# Patient Record
Sex: Male | Born: 1990 | Race: Black or African American | Hispanic: No | Marital: Single | Smoking: Current every day smoker
Health system: Southern US, Community
[De-identification: ages and names within clinical notes are randomized; demographics above are authoritative.]

## PROBLEM LIST (undated history)

## (undated) DIAGNOSIS — F489 Nonpsychotic mental disorder, unspecified: Secondary | ICD-10-CM

---

## 2013-12-31 ENCOUNTER — Emergency Department: Payer: Self-pay | Admitting: Emergency Medicine

## 2013-12-31 LAB — COMPREHENSIVE METABOLIC PANEL
ALBUMIN: 4.4 g/dL (ref 3.4–5.0)
Alkaline Phosphatase: 77 U/L
Anion Gap: 10 (ref 7–16)
BUN: 10 mg/dL (ref 7–18)
Bilirubin,Total: 0.6 mg/dL (ref 0.2–1.0)
CALCIUM: 8.8 mg/dL (ref 8.5–10.1)
CREATININE: 1.17 mg/dL (ref 0.60–1.30)
Chloride: 100 mmol/L (ref 98–107)
Co2: 29 mmol/L (ref 21–32)
Glucose: 90 mg/dL (ref 65–99)
Osmolality: 276 (ref 275–301)
Potassium: 3.8 mmol/L (ref 3.5–5.1)
SGOT(AST): 35 U/L (ref 15–37)
SGPT (ALT): 29 U/L
SODIUM: 139 mmol/L (ref 136–145)
TOTAL PROTEIN: 8.3 g/dL — AB (ref 6.4–8.2)

## 2013-12-31 LAB — URINALYSIS, COMPLETE
BILIRUBIN, UR: NEGATIVE
BLOOD: NEGATIVE
Bacteria: NONE SEEN
GLUCOSE, UR: NEGATIVE mg/dL (ref 0–75)
Ketone: NEGATIVE
Leukocyte Esterase: NEGATIVE
Nitrite: NEGATIVE
Ph: 8 (ref 4.5–8.0)
Protein: NEGATIVE
RBC, UR: NONE SEEN /HPF (ref 0–5)
SPECIFIC GRAVITY: 1.002 (ref 1.003–1.030)
SQUAMOUS EPITHELIAL: NONE SEEN

## 2013-12-31 LAB — CBC WITH DIFFERENTIAL/PLATELET
BASOS ABS: 0.1 10*3/uL (ref 0.0–0.1)
BASOS PCT: 1.7 %
EOS ABS: 0.1 10*3/uL (ref 0.0–0.7)
EOS PCT: 1.1 %
HCT: 44.5 % (ref 40.0–52.0)
HGB: 14 g/dL (ref 13.0–18.0)
LYMPHS ABS: 2.6 10*3/uL (ref 1.0–3.6)
LYMPHS PCT: 51.5 %
MCH: 24 pg — ABNORMAL LOW (ref 26.0–34.0)
MCHC: 31.5 g/dL — ABNORMAL LOW (ref 32.0–36.0)
MCV: 76 fL — ABNORMAL LOW (ref 80–100)
MONOS PCT: 9 %
Monocyte #: 0.5 x10 3/mm (ref 0.2–1.0)
NEUTROS PCT: 36.7 %
Neutrophil #: 1.9 10*3/uL (ref 1.4–6.5)
Platelet: 268 10*3/uL (ref 150–440)
RBC: 5.83 10*6/uL (ref 4.40–5.90)
RDW: 13.5 % (ref 11.5–14.5)
WBC: 5.1 10*3/uL (ref 3.8–10.6)

## 2013-12-31 LAB — LIPASE, BLOOD: Lipase: 111 U/L (ref 73–393)

## 2017-06-24 ENCOUNTER — Emergency Department
Admission: EM | Admit: 2017-06-24 | Discharge: 2017-06-26 | Disposition: A | Discharge status: Other Acute Inpatient Hospital | Payer: No Typology Code available for payment source | Attending: Emergency Medicine | Admitting: Emergency Medicine

## 2017-06-24 ENCOUNTER — Encounter: Payer: Self-pay | Admitting: Emergency Medicine

## 2017-06-24 ENCOUNTER — Emergency Department: Payer: Self-pay

## 2017-06-24 ENCOUNTER — Other Ambulatory Visit: Payer: Self-pay

## 2017-06-24 DIAGNOSIS — R4182 Altered mental status, unspecified: Secondary | ICD-10-CM | POA: Insufficient documentation

## 2017-06-24 DIAGNOSIS — F29 Unspecified psychosis not due to a substance or known physiological condition: Secondary | ICD-10-CM

## 2017-06-24 DIAGNOSIS — F17200 Nicotine dependence, unspecified, uncomplicated: Secondary | ICD-10-CM | POA: Insufficient documentation

## 2017-06-24 DIAGNOSIS — F209 Schizophrenia, unspecified: Secondary | ICD-10-CM | POA: Insufficient documentation

## 2017-06-24 LAB — COMPREHENSIVE METABOLIC PANEL
ALBUMIN: 4.7 g/dL (ref 3.5–5.0)
ALT: 24 U/L (ref 17–63)
AST: 39 U/L (ref 15–41)
Alkaline Phosphatase: 46 U/L (ref 38–126)
Anion gap: 7 (ref 5–15)
BUN: 13 mg/dL (ref 6–20)
CHLORIDE: 101 mmol/L (ref 101–111)
CO2: 29 mmol/L (ref 22–32)
Calcium: 9.5 mg/dL (ref 8.9–10.3)
Creatinine, Ser: 0.93 mg/dL (ref 0.61–1.24)
GFR calc Af Amer: >60 mL/min (ref >60)
GFR calc non Af Amer: >60 mL/min (ref >60)
Glucose, Bld: 94 mg/dL (ref 65–99)
Potassium: 4 mmol/L (ref 3.5–5.1)
SODIUM: 137 mmol/L (ref 135–145)
Total Bilirubin: 1 mg/dL (ref 0.3–1.2)
Total Protein: 8.2 g/dL — ABNORMAL HIGH (ref 6.5–8.1)

## 2017-06-24 LAB — URINE DRUG SCREEN, QUALITATIVE (ARMC ONLY)
Amphetamines, Ur Screen: NOT DETECTED
Barbiturates, Ur Screen: NOT DETECTED
Benzodiazepine, Ur Scrn: NOT DETECTED
Cannabinoid 50 Ng, Ur ~~LOC~~: NOT DETECTED
Cocaine Metabolite,Ur ~~LOC~~: NOT DETECTED
MDMA (Ecstasy)Ur Screen: NOT DETECTED
METHADONE SCREEN, URINE: NOT DETECTED
OPIATE, UR SCREEN: NOT DETECTED
PHENCYCLIDINE (PCP) UR S: NOT DETECTED
Tricyclic, Ur Screen: NOT DETECTED

## 2017-06-24 LAB — CBC WITH DIFFERENTIAL/PLATELET
Basophils Absolute: 0 10*3/uL (ref 0–0.1)
Basophils Relative: 1 %
EOS ABS: 0.1 10*3/uL (ref 0–0.7)
EOS PCT: 3 %
HCT: 44.3 % (ref 40.0–52.0)
Hemoglobin: 14 g/dL (ref 13.0–18.0)
LYMPHS ABS: 1 10*3/uL (ref 1.0–3.6)
LYMPHS PCT: 40 %
MCH: 23.8 pg — AB (ref 26.0–34.0)
MCHC: 31.6 g/dL — AB (ref 32.0–36.0)
MCV: 75.3 fL — AB (ref 80.0–100.0)
MONOS PCT: 9 %
Monocytes Absolute: 0.2 10*3/uL (ref 0.2–1.0)
Neutro Abs: 1.2 10*3/uL — ABNORMAL LOW (ref 1.4–6.5)
Neutrophils Relative %: 47 %
PLATELETS: 270 10*3/uL (ref 150–440)
RBC: 5.88 MIL/uL (ref 4.40–5.90)
RDW: 13.5 % (ref 11.5–14.5)
WBC: 2.6 10*3/uL — ABNORMAL LOW (ref 3.8–10.6)

## 2017-06-24 LAB — ETHANOL: Alcohol, Ethyl (B): <10 mg/dL (ref <10)

## 2017-06-24 MED ORDER — RISPERIDONE 1 MG PO TABS
1.0000 mg | ORAL_TABLET | Freq: Two times a day (BID) | ORAL | Status: DC
  Administered 2017-06-24 – 2017-06-26 (×5): 1 mg via ORAL
  Filled 2017-06-24 (×5): qty 1

## 2017-06-24 NOTE — ED Notes (Signed)
Pt given tray

## 2017-06-24 NOTE — ED Triage Notes (Signed)
Patient states he has had stress, denies thoughts of harming self, conversation noted delusional.

## 2017-06-24 NOTE — ED Provider Notes (Signed)
[facility] Emergency Department Provider Note   ____________________________________________   First MD Initiated Contact with Patient 06/24/17 1135     (approximate)  I have reviewed the triage vital signs and the nursing notes.   HISTORY  Chief Complaint Anxiety    HPI Nathan Barnett. is a 27 y.o. male says he is going to come into some money here and wants to take precautionary measures because he has problems with anxiety.  Denies any thoughts of hurting himself or anyone else.  Triage nurse notes conversation delusional.  The patient really does not make a lot of sense about why he came in today.  But he seems otherwise okay.   History reviewed. No pertinent past medical history.  There are no active problems to display for this patient.   History reviewed. No pertinent surgical history.  Prior to Admission medications   Not on File    Allergies Patient has no known allergies.  No family history on file.  Social History Social History   Tobacco Use  . Smoking status: Current Some Day Smoker    Types: Cigarettes  Substance Use Topics  . Alcohol use: Not on file  . Drug use: Not on file    Review of Systems  Constitutional: No fever/chills Eyes: No visual changes. ENT: No sore throat. Cardiovascular: Denies chest pain. Respiratory: Denies shortness of breath. Gastrointestinal: No abdominal pain.  No nausea, no vomiting.  No diarrhea.  No constipation. Genitourinary: Negative for dysuria. Musculoskeletal: Negative for back pain. Skin: Negative for rash. Neurological: Negative for headaches, focal weakness  ____________________________________________   PHYSICAL EXAM:  VITAL SIGNS: ED Triage Vitals  Enc Vitals Group     BP 06/24/17 1100 (!) 149/95     Pulse Rate 06/24/17 1100 69     Resp 06/24/17 1100 20     Temp 06/24/17 1100 98.1 F (36.7 C)     Temp Source 06/24/17 1100 Oral     SpO2 06/24/17 1100 100  %     Weight 06/24/17 1101 150 lb (68 kg)     Height 06/24/17 1101  (1.854 m)     Head Circumference --      Peak Flow --      Pain Score 06/24/17 1101 0     Pain Loc --      Pain Edu? --      Excl. in GC? --     Constitutional: Alert and oriented. Well appearing and in no acute distress. Eyes: Conjunctivae are normal.  Head: Atraumatic. Nose: No congestion/rhinnorhea. Mouth/Throat: Mucous membranes are moist.  Oropharynx non-erythematous. Neck: No stridor.   Cardiovascular: Normal rate, regular rhythm. Grossly normal heart sounds.  Good peripheral circulation. Respiratory: Normal respiratory effort.  No retractions. Lungs CTAB. Gastrointestinal: Soft and nontender. No distention. No abdominal bruits. No CVA tenderness. Musculoskeletal: No lower extremity tenderness nor edema.   Neurologic:  Normal speech and language. No gross focal neurologic deficits are appreciated. No gait instability. Skin:  Skin is warm, dry and intact. No rash noted. Psychiatric: Mood and affect are normal. Speech and behavior are normal except as noted in HPI.  ____________________________________________   LABS (all labs ordered are listed, but only abnormal results are displayed)  Labs Reviewed  COMPREHENSIVE METABOLIC PANEL - Abnormal; Notable for the following components:      Result Value   Total Protein 8.2 (*)    All other components within normal limits  CBC WITH DIFFERENTIAL/PLATELET - Abnormal;  Notable for the following components:   WBC 2.6 (*)    MCV 75.3 (*)    MCH 23.8 (*)    MCHC 31.6 (*)    Neutro Abs 1.2 (*)    All other components within normal limits  ETHANOL  URINE DRUG SCREEN, QUALITATIVE ([facility] ONLY)   ____________________________________________  EKG  ________________________________________  RADIOLOGY  ED MD interpretation:   Official radiology report(s): No results found.  ____________________________________________   PROCEDURES  Procedure(s)  performed:   Procedures  Critical Care performed:   ____________________________________________   INITIAL IMPRESSION / ASSESSMENT AND PLAN / ED COURSE   Tele-psych sees patient feels he has psychosis I agree with this.      ____________________________________________   FINAL CLINICAL IMPRESSION(S) / ED DIAGNOSES  Final diagnoses:  Psychosis, unspecified psychosis type [facility])     ED Discharge Orders    None       Note:  This document was prepared using Dragon voice recognition software and may include unintentional dictation errors.    Arnaldo Natal, MD 06/24/17 [phone]

## 2017-06-24 NOTE — BH Assessment (Signed)
Writer followed up with patient's mother [name] [phone]) and updated her on patient's disposition.

## 2017-06-24 NOTE — ED Notes (Signed)
Pt. Advised of security cameras and 15 minute safety checks.  Pt. Calm and cooperative.  Pt. Asking when he would be admitted.  Pt. Told it will probable not be tonight.  Pt. Advised to come to this nurse with any concerns or questions.

## 2017-06-24 NOTE — ED Notes (Signed)
Patient to move from RM: 22 to B-4.

## 2017-06-24 NOTE — ED Notes (Signed)
IVC/SOC completed/ pending placement

## 2017-06-24 NOTE — ED Notes (Signed)
Pt dressed out. Pt belongings bag: jacket, shirt,tank top, shorts, briefs, loafers.

## 2017-06-24 NOTE — BH Assessment (Signed)
Assessment Note  Nathan Barnett. is an 27 y.o. male who presents to the ER via his mother Nathan Barnett [phone]). Per the report of the patient, he came to the ER "to get checked out, because I'm coming into a lot of money." Per the report of the patient's mother, his mental state and behaviors have changed within the last several years. Family initially thought the change was due to his alcohol use. Approximately a year ago, his family sought to get inpatient for alcohol treatment, but the initially facility educated them about the primary symptoms they were witnessing wasn't from the alcohol use but from mental illness. Thus, they referred him to another facility to address it, [facility] When he was inpatient, he was started on medications but when he was discharged, he stop taking it because he said it made him feel sluggish and tired.  Mother further reports, the patient has been paranoid, he believes people are trying to hurt him. He has start going to neighbors houses, auguring with them because he feel they were talking about him. When he is talking, his thoughts are disorganized and difficult to follow. On today (06/24/2017), while he was in the car with his mother, he asked her "You think I'm going to win?" Patient's mother stated she didn't know what he was talking about, so she told him "yes he was a going to win." She used this as an opportunity to get him to come to the ER. She told him, "You need to come to the hospital to get checked out, so if you get the money you can be okay to manage it." Thus, patient agreed to come.  Mother also reports, the patient isolate his self and hygiene had decrease. Prior to his current presentation, the patient was well groomed and "took pride" in how he looked. He was able to complete school and receive his Bachelors Degree from [employer]  During the interview, the patient was guarded and provided limited answers to the question. Prior to  writer walking into his room, he witnessed the patient responding to internal stimuli. At times when he was answering writer's questions, he would look to the side; nod his head and then answered.   Diagnosis: Schizophrenia  Past Medical History: History reviewed. No pertinent past medical history.  History reviewed. No pertinent surgical history.  Family History: No family history on file.  Social History:  reports that he has been smoking cigarettes.  He does not have any smokeless tobacco history on file. His alcohol and drug histories are not on file.  Additional Social History:  Alcohol / Drug Use Pain Medications: See PTA Prescriptions: See PTA Over the Counter: See PTA History of alcohol / drug use?: Yes Longest period of sobriety (when/how long): Unable to quantify Negative Consequences of Use: (n/a) Withdrawal Symptoms: (n/a) Substance #1 Name of Substance 1: Alcohol 1 - Age of First Use: 24 1 - Amount (size/oz): 2-40oz 1 - Frequency: 3 to 4 times week 1 - Duration: Approximately 2 to 3 years 1 - Last Use / Amount: 06/23/2017  CIWA: CIWA-Ar BP: (!) 149/95 Pulse Rate: 69 COWS:    Allergies: No Known Allergies  Home Medications:  (Not in a hospital admission)  OB/GYN Status:  No LMP for male patient.  General Assessment Data Location of Assessment:  [facility] ED TTS Assessment: In system Is this a Tele or Face-to-Face Assessment?: Face-to-Face Is this an Initial Assessment or a Re-assessment for this encounter?: Initial Assessment Marital status:  Single Maiden name: n/a Is patient pregnant?: No Living Arrangements: Parent Can pt return to current living arrangement?: Yes Admission Status: Involuntary Is patient capable of signing voluntary admission?: No(Under IVC) Referral Source: Self/Family/Friend Insurance type: None  Medical Screening Exam [facility] Walk-in ONLY) Medical Exam completed: Yes  Crisis Care Plan Living Arrangements: Parent Legal Guardian:  Other:(Self) Name of Psychiatrist: Reports of none Name of Therapist: Reports of none  Education Status Is patient currently in school?: No Is the patient employed, unemployed or receiving disability?: Unemployed  Risk to self with the past 6 months Suicidal Ideation: No Has patient been a risk to self within the past 6 months prior to admission? : No Suicidal Intent: No Has patient had any suicidal intent within the past 6 months prior to admission? : No Is patient at risk for suicide?: No Suicidal Plan?: No Has patient had any suicidal plan within the past 6 months prior to admission? : No Access to Means: No What has been your use of drugs/alcohol within the last 12 months?: Alcohol Previous Attempts/Gestures: No How many times?: 0 Other Self Harm Risks: Reports of none Triggers for Past Attempts: None known Intentional Self Injurious Behavior: None Family Suicide History: No Recent stressful life event(s): Other (Comment)(Recent onsite of Psychosis) Persecutory voices/beliefs?: No Depression: Yes Depression Symptoms: Insomnia, Isolating, Feeling angry/irritable Substance abuse history and/or treatment for substance abuse?: No Suicide prevention information given to non-admitted patients: Not applicable  Risk to Others within the past 6 months Homicidal Ideation: No Does patient have any lifetime risk of violence toward others beyond the six months prior to admission? : No Thoughts of Harm to Others: No Current Homicidal Intent: No Current Homicidal Plan: No Access to Homicidal Means: No Identified Victim: Reports of none History of harm to others?: No Assessment of Violence: None Noted Violent Behavior Description: Reports of none Does patient have access to weapons?: No Criminal Charges Pending?: No Is patient on probation?: No  Psychosis Hallucinations: Auditory, Visual Delusions: None noted  Mental Status Report Appearance/Hygiene: Unremarkable, In  scrubs Eye Contact: Fair Motor Activity: Freedom of movement, Unremarkable Speech: Logical/coherent, Unremarkable Level of Consciousness: Alert Mood: Depressed, Anxious, Pleasant Affect: Appropriate to circumstance, Depressed, Anxious Anxiety Level: Minimal Thought Processes: Coherent, Relevant Judgement: Unimpaired Orientation: Person, Place, Time, Situation, Appropriate for developmental age Obsessive Compulsive Thoughts/Behaviors: Minimal  Cognitive Functioning Concentration: Normal Memory: Recent Intact, Remote Intact Is patient IDD: No Is patient DD?: No Insight: Fair Impulse Control: Fair Appetite: Fair Have you had any weight changes? : No Change Sleep: No Change Total Hours of Sleep: 8 Vegetative Symptoms: None  ADLScreening [facility] Assessment Services) Patient's cognitive ability adequate to safely complete daily activities?: Yes Patient able to express need for assistance with ADLs?: Yes Independently performs ADLs?: Yes (appropriate for developmental age)  Prior Inpatient Therapy Prior Inpatient Therapy: Yes Prior Therapy Dates: 2018 Prior Therapy Facilty/Provider(s): [facility] Reason for Treatment: Psychosis & Alcohol Treatment  Prior Outpatient Therapy Prior Outpatient Therapy: No Does patient have an ACCT team?: No Does patient have Intensive In-House Services?  : No Does patient have [facility] services? : No Does patient have P4CC services?: No  ADL Screening (condition at time of admission) Patient's cognitive ability adequate to safely complete daily activities?: Yes Is the patient deaf or have difficulty hearing?: No Does the patient have difficulty seeing, even when wearing glasses/contacts?: No Does the patient have difficulty concentrating, remembering, or making decisions?: No Patient able to express need for assistance  with ADLs?: Yes Does the patient have difficulty dressing or bathing?: No Independently  performs ADLs?: Yes (appropriate for developmental age) Does the patient have difficulty walking or climbing stairs?: No Weakness of Legs: None Weakness of Arms/Hands: None  Home Assistive Devices/Equipment Home Assistive Devices/Equipment: None  Therapy Consults (therapy consults require a physician order) PT Evaluation Needed: No OT Evalulation Needed: No SLP Evaluation Needed: No Abuse/Neglect Assessment (Assessment to be complete while patient is alone) Abuse/Neglect Assessment Can Be Completed: Yes Physical Abuse: Denies Verbal Abuse: Denies Sexual Abuse: Denies Exploitation of patient/patient's resources: Denies Self-Neglect: Denies Values / Beliefs Cultural Requests During Hospitalization: None Spiritual Requests During Hospitalization: None Consults Spiritual Care Consult Needed: No Social Work Consult Needed: No         Child/Adolescent Assessment Running Away Risk: Denies(Patient is an adult)  Disposition:  Disposition Initial Assessment Completed for this Encounter: Yes  On Site Evaluation by:   Reviewed with Physician:    Lilyan Gilford MS, LCAS, LPC, NCC, CCSI Therapeutic Triage Specialist 06/24/2017 4:39 PM

## 2017-06-25 NOTE — ED Notes (Signed)
Patient received PM snack. 

## 2017-06-25 NOTE — ED Notes (Signed)

## 2017-06-25 NOTE — ED Notes (Signed)
Patient alert and verbal. Patient is somewhat guarded. He interacts with this writer very minimal. He states he knows why he is in the hospital but did not care to talk about it. Patient is taking medications as ordered.Patient denies SI/HI and A/V hallucinations. Q 15 minute checks in progress and patient remains safe on unit. Monitoring continues.

## 2017-06-25 NOTE — ED Notes (Signed)
Pt taking a shower at this time 

## 2017-06-25 NOTE — ED Notes (Signed)
Patient given dinner meal tray

## 2017-06-25 NOTE — ED Notes (Signed)
Breakfast tray placed in room.  Pt sleeping.  

## 2017-06-25 NOTE — ED Notes (Signed)
IVC/Pending placement 

## 2017-06-26 ENCOUNTER — Inpatient Hospital Stay
Admission: AD | Admit: 2017-06-26 | Discharge: 2017-07-04 | DRG: 885 | Disposition: A | Discharge status: Home / Self Care | Payer: No Typology Code available for payment source | Attending: Psychiatry | Admitting: Psychiatry

## 2017-06-26 ENCOUNTER — Encounter: Payer: Self-pay | Admitting: Behavioral Health

## 2017-06-26 ENCOUNTER — Other Ambulatory Visit: Payer: Self-pay

## 2017-06-26 DIAGNOSIS — D708 Other neutropenia: Secondary | ICD-10-CM

## 2017-06-26 DIAGNOSIS — D709 Neutropenia, unspecified: Secondary | ICD-10-CM | POA: Diagnosis present

## 2017-06-26 DIAGNOSIS — R161 Splenomegaly, not elsewhere classified: Secondary | ICD-10-CM

## 2017-06-26 DIAGNOSIS — F17210 Nicotine dependence, cigarettes, uncomplicated: Secondary | ICD-10-CM | POA: Diagnosis present

## 2017-06-26 DIAGNOSIS — F209 Schizophrenia, unspecified: Secondary | ICD-10-CM | POA: Diagnosis present

## 2017-06-26 DIAGNOSIS — F29 Unspecified psychosis not due to a substance or known physiological condition: Secondary | ICD-10-CM | POA: Diagnosis not present

## 2017-06-26 DIAGNOSIS — F203 Undifferentiated schizophrenia: Secondary | ICD-10-CM | POA: Diagnosis not present

## 2017-06-26 DIAGNOSIS — Z720 Tobacco use: Secondary | ICD-10-CM | POA: Diagnosis not present

## 2017-06-26 MED ORDER — HYDROXYZINE HCL 25 MG PO TABS
25.0000 mg | ORAL_TABLET | Freq: Three times a day (TID) | ORAL | Status: DC | PRN
  Administered 2017-06-28: 25 mg via ORAL
  Filled 2017-06-26: qty 1

## 2017-06-26 MED ORDER — PALIPERIDONE ER 3 MG PO TB24
3.0000 mg | ORAL_TABLET | Freq: Every day | ORAL | Status: DC
  Administered 2017-06-26: 3 mg via ORAL
  Filled 2017-06-26: qty 1

## 2017-06-26 MED ORDER — TRAZODONE HCL 100 MG PO TABS
100.0000 mg | ORAL_TABLET | Freq: Every evening | ORAL | Status: DC | PRN
  Administered 2017-06-26: 100 mg via ORAL
  Filled 2017-06-26: qty 1

## 2017-06-26 MED ORDER — ACETAMINOPHEN 325 MG PO TABS: 650.0000 mg | ORAL_TABLET | Freq: Four times a day (QID) | ORAL | Status: DC | PRN

## 2017-06-26 MED ORDER — MAGNESIUM HYDROXIDE 400 MG/5ML PO SUSP: 30.0000 mL | Freq: Every day | ORAL | Status: DC | PRN

## 2017-06-26 MED ORDER — PALIPERIDONE ER 3 MG PO TB24: 3.0000 mg | ORAL_TABLET | Freq: Every day | ORAL | Status: DC

## 2017-06-26 MED ORDER — ALUM & MAG HYDROXIDE-SIMETH 200-200-20 MG/5ML PO SUSP: 30.0000 mL | ORAL | Status: DC | PRN

## 2017-06-26 NOTE — BH Assessment (Signed)
Patient is to be admitted to [facility] by Dr. Toni Amend.  Attending Physician will be Dr. Johnella Moloney.   Patient has been assigned to room 315, by [facility] Charge Nurse Shatara.   Intake Paper Work has been signed and placed on patient chart.  ER staff is aware of the admission:  Misty Stanley, ER Kathrynn Speed   Dr. Lamont Snowball, ER MD   Fredia Beets., Patient's Nurse   Mertie Clause, Patient Access.

## 2017-06-26 NOTE — Consult Note (Signed)
Addendum to previous note.  After dictating I was able to reach the patient's mother on the telephone.  She described a history over the last few years that would be very consistent with gradually developing schizophrenia.  This is also a diagnosis they had been presented with previously but he has not been following up with any outpatient treatment lately.  She told me that the precipitating situation to bring him to the hospital today is that he has started to have odd behavior of shouting out and insulting neighbors in ways that make no sense and are disruptive.  No change to treatment plan

## 2017-06-26 NOTE — ED Notes (Signed)
Patient ate 100% of lunch and beverage. Patient is cooperative, does not want to talk much, likes being left alone, gives short answers and avoids conversation.

## 2017-06-26 NOTE — ED Provider Notes (Signed)
-----------------------------------------   12:39 AM on 06/26/2017 -----------------------------------------   Blood pressure 118/74, pulse 71, temperature 98.3 F (36.8 C), temperature source Oral, resp. rate 18, height  (1.854 m), weight 68 kg (150 lb), SpO2 100 %.  The patient had no acute events since last update.  Calm and cooperative at this time.  Disposition is pending Psychiatry/Behavioral Medicine team recommendations.     Myrna Blazer, MD 06/26/17 [phone]

## 2017-06-26 NOTE — ED Notes (Signed)
Patient with discharge orders/readmit, transferring to room 315, nurse reported to [employer],  Patient going in police custody that He is under IVC, and transfers via w/c.

## 2017-06-26 NOTE — Tx Team (Signed)
Initial Treatment Plan 06/26/2017 6:48 PM Nathan Barnett [name] [id number]    PATIENT STRESSORS: Marital or family conflict Medication change or noncompliance   PATIENT STRENGTHS: Active sense of humor Supportive family/friends   PATIENT IDENTIFIED PROBLEMS: Medication compliance  Self care with ADLs  Develop a goals for discharge                 DISCHARGE CRITERIA:  Adequate post-discharge living arrangements Motivation to continue treatment in a less acute level of care Verbal commitment to aftercare and medication compliance  PRELIMINARY DISCHARGE PLAN: Attend PHP/IOP Outpatient therapy Return to previous living arrangement  PATIENT/FAMILY INVOLVEMENT: This treatment plan has been presented to and reviewed with the patient, Nathan Barnett., and/or family member.  The patient and family have been given the opportunity to ask questions and make suggestions.  Nathan Arnt, RN 06/26/2017, 6:48 PM

## 2017-06-26 NOTE — ED Notes (Signed)
Dr. Clapacs talking with Patient, Patient is calm and cooperative.  

## 2017-06-26 NOTE — Consult Note (Signed)
[facility] Face-to-Face Psychiatry Consult   Reason for Consult: Consult for 27 year old man who is brought to the emergency room over the weekend in the company of his family with concern about his behavior and his thinking. Referring Physician: Don Perking Patient Identification: Nathan Barnett. MRN:  [id number] Principal Diagnosis: Schizophrenia [facility]) Diagnosis:   Patient Active Problem List   Diagnosis Date Noted  . Schizophrenia (HCC) [F20.9] 06/26/2017    Total Time spent with patient: 1 hour  Subjective:   Nathan Barnett. is a 27 y.o. male patient admitted with "I just needed some kind of rest".  HPI: Patient interviewed.  Chart reviewed.  27 year old man was brought here by his mother over the weekend with stated concerns that his behavior has been getting stranger.  The patient himself was interviewed today and was not able to articulate a very clear complaint.  He tells me that he is about to inherit some money but gets paranoid and refuses to explain that.  He says that he and his mother felt that he needed to come to the hospital for a "prophylactic" rest in order to get his "mind together".  When challenged to be more specific about what that means and what kind of symptoms he is having he is very evasive and denies any actual symptoms.  Denies depression.  Denies hallucinations.  Denies suicidal or homicidal thoughts and even denies anxiety.  According to the chart the mother reports that the patient's behavior got strange several years ago and has gotten stranger and stayed that way.  He is not functioning at his potential.  Lives at home not working acting weird talking strangely not getting any psychiatric treatment.  No evidence of any current use of alcohol or drugs.    Substance abuse history: Looking through the chart it looks like this was considered as an etiology years ago but the patient denies that he is drinking or using any drugs currently there is nothing in his  drug or alcohol screen.  Medical history: No known significant medical problems  Social history: Lives with his family of origin.  Graduated from college.  Not working not really functioning now.  Past Psychiatric History: Patient evidently had inpatient treatment of some sort a couple years ago.  According to the chart and the mother says that he was diagnosed with a mental health problem.  Medications unclear.  Patient himself denies ever having been prescribed any medicines denies knowing of any diagnosis.  No history report of any violence or suicidal behavior  Risk to Self: Suicidal Ideation: No Suicidal Intent: No Is patient at risk for suicide?: No Suicidal Plan?: No Access to Means: No What has been your use of drugs/alcohol within the last 12 months?: Alcohol How many times?: 0 Other Self Harm Risks: Reports of none Triggers for Past Attempts: None known Intentional Self Injurious Behavior: None Risk to Others: Homicidal Ideation: No Thoughts of Harm to Others: No Current Homicidal Intent: No Current Homicidal Plan: No Access to Homicidal Means: No Identified Victim: Reports of none History of harm to others?: No Assessment of Violence: None Noted Violent Behavior Description: Reports of none Does patient have access to weapons?: No Criminal Charges Pending?: No Prior Inpatient Therapy: Prior Inpatient Therapy: Yes Prior Therapy Dates: 2018 Prior Therapy Facilty/Provider(s): [facility] Reason for Treatment: Psychosis & Alcohol Treatment Prior Outpatient Therapy: Prior Outpatient Therapy: No Does patient have an ACCT team?: No Does patient have Intensive In-House Services?  :  No Does patient have [facility] services? : No Does patient have P4CC services?: No  Past Medical History: History reviewed. No pertinent past medical history. History reviewed. No pertinent surgical history. Family History: No family history on file. Family  Psychiatric  History: None identified Social History:  Social History   Substance and Sexual Activity  Alcohol Use Not on file     Social History   Substance and Sexual Activity  Drug Use Not on file    Social History   Socioeconomic History  . Marital status: Single    Spouse name: Not on file  . Number of children: Not on file  . Years of education: Not on file  . Highest education level: Not on file  Occupational History  . Not on file  Social Needs  . Financial resource strain: Not on file  . Food insecurity:    Worry: Not on file    Inability: Not on file  . Transportation needs:    Medical: Not on file    Non-medical: Not on file  Tobacco Use  . Smoking status: Current Some Day Smoker    Types: Cigarettes  Substance and Sexual Activity  . Alcohol use: Not on file  . Drug use: Not on file  . Sexual activity: Not on file  Lifestyle  . Physical activity:    Days per week: Not on file    Minutes per session: Not on file  . Stress: Not on file  Relationships  . Social connections:    Talks on phone: Not on file    Gets together: Not on file    Attends religious service: Not on file    Active member of club or organization: Not on file    Attends meetings of clubs or organizations: Not on file    Relationship status: Not on file  Other Topics Concern  . Not on file  Social History Narrative  . Not on file   Additional Social History:    Allergies:  No Known Allergies  Labs: No results found for this or any previous visit (from the past 48 hour(s)).  Current Facility-Administered Medications  Medication Dose Route Frequency Provider Last Rate Last Dose  . risperiDONE (RISPERDAL) tablet 1 mg  1 mg Oral BID Arnaldo Natal, MD   1 mg at 06/26/17 1036   No current outpatient medications on file.    Musculoskeletal: Strength & Muscle Tone: within normal limits Gait & Station: normal Patient leans: N/A  Psychiatric Specialty Exam: Physical Exam   Nursing note and vitals reviewed. Constitutional: He appears well-developed and well-nourished.  HENT:  Head: Normocephalic and atraumatic.  Eyes: Pupils are equal, round, and reactive to light. Conjunctivae are normal.  Neck: Normal range of motion.  Cardiovascular: Regular rhythm and normal heart sounds.  Respiratory: Effort normal. No respiratory distress.  GI: Soft.  Musculoskeletal: Normal range of motion.  Neurological: He is alert.  Skin: Skin is warm and dry.  Psychiatric: His affect is blunt. His speech is delayed and tangential. He is slowed. He is not aggressive and not hyperactive. Thought content is paranoid. Cognition and memory are impaired. He expresses impulsivity and inappropriate judgment. He expresses no homicidal and no suicidal ideation.    Review of Systems  Constitutional: Negative.   HENT: Negative.   Eyes: Negative.   Respiratory: Negative.   Cardiovascular: Negative.   Gastrointestinal: Negative.   Musculoskeletal: Negative.   Skin: Negative.   Neurological: Negative.   Psychiatric/Behavioral: Negative for depression,  hallucinations, memory loss, substance abuse and suicidal ideas. The patient is nervous/anxious. The patient does not have insomnia.     Blood pressure 118/74, pulse 71, temperature 98.3 F (36.8 C), temperature source Oral, resp. rate 18, height  (1.854 m), weight 68 kg (150 lb), SpO2 100 %.Body mass index is 19.79 kg/m.  General Appearance: Casual  Eye Contact:  Fair  Speech:  Clear and Coherent  Volume:  Normal  Mood:  Euthymic  Affect:  Constricted  Thought Process:  Disorganized  Orientation:  Full (Time, Place, and Person)  Thought Content:  Illogical and Rumination  Suicidal Thoughts:  No  Homicidal Thoughts:  No  Memory:  Immediate;   Fair Recent;   Fair Remote;   Fair  Judgement:  Impaired  Insight:  Lacking  Psychomotor Activity:  Decreased  Concentration:  Concentration: Fair  Recall:  [employer] of Knowledge:   Fair  Language:  Fair  Akathisia:  No  Handed:  Right  AIMS (if indicated):     Assets:  Desire for Improvement Housing Physical Health  ADL's:  Intact  Cognition:  Impaired,  Mild  Sleep:        Treatment Plan Summary: Medication management and Plan This is a 27 year old man who his history is 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  Not on any medication.  Insight poor.  Because his behavior has so far not been extreme he appears to have evaded any forced treatment.  In the interview with me he holds it together pretty well.  Some of the things in the history are a little more worrisome such as the fact that he has been going to other people's houses knocking on doors of strangers in the neighborhood.  Patient could benefit from psychiatric hospitalization and commitment has already been filed.  Recommend admission to the psychiatric ward case reviewed with TTS.  Orders completed.  Disposition: Recommend psychiatric Inpatient admission when medically cleared. Supportive therapy provided about ongoing stressors.  Mordecai Rasmussen, MD 06/26/2017 3:11 PM

## 2017-06-26 NOTE — Plan of Care (Signed)
Patient is a 27 year old admitted due to psychosis; bizarre behavior. During admission onto the unit patient denied SI, HI and AVH. During admission patient could not sit still, very fidgety with bizarre behavior such as rubbing deodorant onto stomach and looking inside of his pants. When asked what brought patient to the hospital, patients response was "I'd rather be safe then sorry." Nurse asked patient what was meant by "Rather be safe than sorry" patient would not answer only laughed and continued to giggle.  Patient states he lives with, " Two brothers, my mother and her husband." Patient states he drinks 2 beers a month and smokes cigarettes, two packs a month. RN asked if patient would like a nicotine patch, patient declined, RN explained if at any time patient would like to receive smoking cessation information please contact staff. Patient does not have a guardian and refused information  about advanced directives. Patient very paranoid and gaurded during admission. Patient skin assessment completed with the help of Demetria RN. Skin intact, and dry with no contraband found. RN will continue to monitor. Problem: Education: Goal: Knowledge of [facility] Education information/materials will improve Outcome: Not Progressing Goal: Emotional status will improve Outcome: Not Progressing Goal: Mental status will improve Outcome: Not Progressing Goal: Verbalization of understanding the information provided will improve Outcome: Not Progressing   Problem: Activity: Goal: Interest or engagement in activities will improve Outcome: Not Progressing

## 2017-06-27 ENCOUNTER — Inpatient Hospital Stay: Payer: No Typology Code available for payment source

## 2017-06-27 DIAGNOSIS — F203 Undifferentiated schizophrenia: Secondary | ICD-10-CM

## 2017-06-27 DIAGNOSIS — D708 Other neutropenia: Secondary | ICD-10-CM

## 2017-06-27 DIAGNOSIS — F209 Schizophrenia, unspecified: Principal | ICD-10-CM

## 2017-06-27 DIAGNOSIS — F29 Unspecified psychosis not due to a substance or known physiological condition: Secondary | ICD-10-CM

## 2017-06-27 DIAGNOSIS — Z720 Tobacco use: Secondary | ICD-10-CM

## 2017-06-27 LAB — LIPID PANEL
CHOLESTEROL: 157 mg/dL (ref 0–200)
HDL: 62 mg/dL (ref >40)
LDL CALC: 77 mg/dL (ref 0–99)
Total CHOL/HDL Ratio: 2.5 RATIO
Triglycerides: 89 mg/dL (ref <150)
VLDL: 18 mg/dL (ref 0–40)

## 2017-06-27 LAB — CBC WITH DIFFERENTIAL/PLATELET
BASOS ABS: 0 10*3/uL (ref 0–0.1)
BASOS PCT: 1 %
Basophils Absolute: 0 10*3/uL (ref 0–0.1)
Basophils Relative: 1 %
EOS ABS: 0.1 10*3/uL (ref 0–0.7)
Eosinophils Absolute: 0.1 10*3/uL (ref 0–0.7)
Eosinophils Relative: 3 %
Eosinophils Relative: 3 %
HCT: 39.7 % — ABNORMAL LOW (ref 40.0–52.0)
HCT: 38.8 % — ABNORMAL LOW (ref 40.0–52.0)
HEMOGLOBIN: 12.7 g/dL — AB (ref 13.0–18.0)
Hemoglobin: 12.5 g/dL — ABNORMAL LOW (ref 13.0–18.0)
LYMPHS ABS: 1.2 10*3/uL (ref 1.0–3.6)
LYMPHS PCT: 46 %
Lymphocytes Relative: 52 %
Lymphs Abs: 1.6 10*3/uL (ref 1.0–3.6)
MCH: 24.4 pg — ABNORMAL LOW (ref 26.0–34.0)
MCH: 24.2 pg — ABNORMAL LOW (ref 26.0–34.0)
MCHC: 32.1 g/dL (ref 32.0–36.0)
MCHC: 32.2 g/dL (ref 32.0–36.0)
MCV: 76.1 fL — ABNORMAL LOW (ref 80.0–100.0)
MCV: 75.3 fL — AB (ref 80.0–100.0)
MONO ABS: 0.2 10*3/uL (ref 0.2–1.0)
Monocytes Absolute: 0.2 10*3/uL (ref 0.2–1.0)
Monocytes Relative: 8 %
Monocytes Relative: 6 %
NEUTROS ABS: 1.5 10*3/uL (ref 1.4–6.5)
NEUTROS PCT: 36 %
Neutro Abs: 0.8 10*3/uL — ABNORMAL LOW (ref 1.4–6.5)
Neutrophils Relative %: 44 %
Platelets: 218 10*3/uL (ref 150–440)
Platelets: 219 10*3/uL (ref 150–440)
RBC: 5.22 MIL/uL (ref 4.40–5.90)
RBC: 5.15 MIL/uL (ref 4.40–5.90)
RDW: 13.8 % (ref 11.5–14.5)
RDW: 13.7 % (ref 11.5–14.5)
WBC: 2.3 10*3/uL — ABNORMAL LOW (ref 3.8–10.6)
WBC: 3.4 10*3/uL — ABNORMAL LOW (ref 3.8–10.6)

## 2017-06-27 LAB — FOLATE: FOLATE: 15.5 ng/mL (ref >5.9)

## 2017-06-27 LAB — TECHNOLOGIST SMEAR REVIEW

## 2017-06-27 LAB — VITAMIN B12: VITAMIN B 12: 1275 pg/mL — AB (ref 180–914)

## 2017-06-27 LAB — LACTATE DEHYDROGENASE: LDH: 142 U/L (ref 98–192)

## 2017-06-27 LAB — TSH: TSH: 1.826 u[IU]/mL (ref 0.350–4.500)

## 2017-06-27 LAB — HEMOGLOBIN A1C
Hgb A1c MFr Bld: 4.6 % — ABNORMAL LOW (ref 4.8–5.6)
Mean Plasma Glucose: 85.32 mg/dL

## 2017-06-27 LAB — C-REACTIVE PROTEIN

## 2017-06-27 MED ORDER — LITHIUM CARBONATE ER 300 MG PO TBCR
300.0000 mg | EXTENDED_RELEASE_TABLET | Freq: Every day | ORAL | Status: DC
  Administered 2017-06-27 – 2017-07-03 (×7): 300 mg via ORAL
  Filled 2017-06-27 (×7): qty 1

## 2017-06-27 MED ORDER — TEMAZEPAM 7.5 MG PO CAPS
7.5000 mg | ORAL_CAPSULE | Freq: Every evening | ORAL | Status: DC | PRN
  Administered 2017-06-27 – 2017-07-03 (×7): 7.5 mg via ORAL
  Filled 2017-06-27 (×7): qty 1

## 2017-06-27 MED ORDER — HALOPERIDOL 5 MG PO TABS
5.0000 mg | ORAL_TABLET | Freq: Every day | ORAL | Status: DC
  Administered 2017-06-27 – 2017-06-28 (×2): 5 mg via ORAL
  Filled 2017-06-27 (×2): qty 1

## 2017-06-27 NOTE — Plan of Care (Signed)
Patient is calm and responding well to assessment questions, patient is alert and coherent denies any SI/HI , no signs of AVH at this time, educate patient on safety and medication regimen acknowledge information provided , encouragement and support is accorded to patient no acute distress noted. Problem: Education: Goal: Knowledge of [facility] Education information/materials will improve Outcome: Progressing Goal: Emotional status will improve Outcome: Progressing Goal: Mental status will improve Outcome: Progressing Goal: Verbalization of understanding the information provided will improve Outcome: Progressing   Problem: Activity: Goal: Interest or engagement in activities will improve Outcome: Progressing   Problem: Coping: Goal: Ability to verbalize frustrations and anger appropriately will improve Outcome: Progressing   Problem: Health Behavior/Discharge Planning: Goal: Identification of resources available to assist in meeting health care needs will improve Outcome: Progressing Goal: Compliance with treatment plan for underlying cause of condition will improve Outcome: Progressing   Problem: Safety: Goal: Periods of time without injury will increase Outcome: Progressing   Problem: Education: Goal: Will be free of psychotic symptoms Outcome: Progressing   Problem: Self-Care: Goal: Ability to participate in self-care as condition permits will improve Outcome: Progressing

## 2017-06-27 NOTE — [facility] Group Notes (Signed)

## 2017-06-27 NOTE — [facility] Group Notes (Signed)
CSW Group Therapy Note  06/27/2017  Time:  0900  Type of Therapy and Topic: Group Therapy: Goals Group: SMART Goals    Participation Level:  None    Description of Group:   The purpose of a daily goals group is to assist and guide patients in setting recovery/wellness-related goals. The objective is to set goals as they relate to the crisis in which they were admitted. Patients will be using SMART goal modalities to set measurable goals. Characteristics of realistic goals will be discussed and patients will be assisted in setting and processing how one will reach their goal. Facilitator will also assist patients in applying interventions and coping skills learned in psycho-education groups to the SMART goal and process how one will achieve defined goal.    Therapeutic Goals:  -Patients will develop and document one goal related to or their crisis in which brought them into treatment.  -Patients will be guided by LCSW using SMART goal setting modality in how to set a measurable, attainable, realistic and time sensitive goal.  -Patients will process barriers in reaching goal.  -Patients will process interventions in how to overcome and successful in reaching goal.    Patient's Goal:  Pt attended group but did not participate. Pt reported, "I'm just here to listen." When prompted by CSW, pt declined to answer. Pt spoke over other patients, and asked CSW, "how long have you been married?" with no context at all. Pt's thoughts were disorganized, and he was not able to accept redirection appropriately.    Therapeutic Modalities:  Motivational Interviewing  Cognitive Behavioral Therapy  Crisis Intervention Model  SMART goals setting  Heidi Dach, MSW, LCSW Clinical Social Worker 06/27/2017 9:43 AM

## 2017-06-27 NOTE — Plan of Care (Signed)
Patient is alert to self and unit. Patient denies psych symptoms, but clearly is responding to internal stimuli, as evidenced by walking and pacing the floor and talking to himself. Patient continues to be disorganized but interaction is brief and minimal, patient avoids direct conversation with nurse. Patient is not progressing, not attending groups, not able to express or elaborate on feelings, patient continues to be guarded. Nurse will continue to monitor. Safety checks Q 15 minutes will continue. Problem: Education: Goal: Knowledge of [facility] Education information/materials will improve Outcome: Not Progressing Goal: Emotional status will improve Outcome: Not Progressing Goal: Mental status will improve Outcome: Not Progressing Goal: Verbalization of understanding the information provided will improve Outcome: Not Progressing   Problem: Activity: Goal: Interest or engagement in activities will improve Outcome: Not Progressing   Problem: Health Behavior/Discharge Planning: Goal: Identification of resources available to assist in meeting health care needs will improve Outcome: Not Progressing Goal: Compliance with treatment plan for underlying cause of condition will improve Outcome: Not Progressing

## 2017-06-27 NOTE — Consult Note (Signed)
[facility] CONSULT NOTE  Patient Care Team: Patient, No Pcp Per as PCP - General (General Practice)  CHIEF COMPLAINTS/PURPOSE OF CONSULTATION: Neutropenia   HISTORY OF PRESENTING ILLNESS: Please note patient is a poor historian/history of schizophrenia Nathan Barnett. 27 y.o.  male is currently up to the hospital for psychosis/schizophrenia noted to have neutropenia/leukopenia on CBC.  Patient denies being on any recent antipsychotic medications.  He admits to drinking alcohol twice a month.  Denies any other drug abuse.  He denies any frequent infections.  He denies being told to have prior low blood counts.   ROS: Patient denies of weight loss.  Denies any night sweats.  No early satiety or abdominal pain.  However unreliable given his mental illness.  A complete 10 point review of system is done which is negative except mentioned above in history of present illness  MEDICAL HISTORY:  History reviewed. No pertinent past medical history.  SURGICAL HISTORY: History reviewed. No pertinent surgical history.  SOCIAL HISTORY: Admits to smoking.  Admits to alcohol twice a month.  Social History   Socioeconomic History  . Marital status: Single    Spouse name: Not on file  . Number of children: Not on file  . Years of education: Not on file  . Highest education level: Not on file  Occupational History  . Not on file  Social Needs  . Financial resource strain: Not on file  . Food insecurity:    Worry: Not on file    Inability: Not on file  . Transportation needs:    Medical: Not on file    Non-medical: Not on file  Tobacco Use  . Smoking status: Current Some Day Smoker    Types: Cigarettes  . Smokeless tobacco: Never Used  Substance and Sexual Activity  . Alcohol use: Not on file  . Drug use: Not on file  . Sexual activity: Not on file  Lifestyle  . Physical activity:    Days per week: Not on file    Minutes per session: Not on file  . Stress: Not  on file  Relationships  . Social connections:    Talks on phone: Not on file    Gets together: Not on file    Attends religious service: Not on file    Active member of club or organization: Not on file    Attends meetings of clubs or organizations: Not on file    Relationship status: Not on file  . Intimate partner violence:    Fear of current or ex partner: Not on file    Emotionally abused: Not on file    Physically abused: Not on file    Forced sexual activity: Not on file  Other Topics Concern  . Not on file  Social History Narrative  . Not on file    FAMILY HISTORY: Denies any family history of cancers or leukemias. History reviewed. No pertinent family history.  ALLERGIES:  has No Known Allergies.  MEDICATIONS:  Current Facility-Administered Medications  Medication Dose Route Frequency Provider Last Rate Last Dose  . acetaminophen (TYLENOL) tablet 650 mg  650 mg Oral Q6H PRN Clapacs, John T, MD      . alum & mag hydroxide-simeth (MAALOX/MYLANTA) 200-200-20 MG/5ML suspension 30 mL  30 mL Oral Q4H PRN Clapacs, John T, MD      . haloperidol (HALDOL) tablet 5 mg  5 mg Oral QHS McNew, Tyson Babinski, MD      . hydrOXYzine (ATARAX/VISTARIL)  tablet 25 mg  25 mg Oral TID PRN Clapacs, John T, MD      . lithium carbonate (LITHOBID) CR tablet 300 mg  300 mg Oral QHS McNew, Holly R, MD      . magnesium hydroxide (MILK OF MAGNESIA) suspension 30 mL  30 mL Oral Daily PRN Clapacs, John T, MD      . temazepam (RESTORIL) capsule 7.5 mg  7.5 mg Oral QHS PRN Marylin Crosby, MD          .  PHYSICAL EXAMINATION:  Vitals:   06/27/17 0611 06/27/17 0612  BP: 118/75 137/79  Pulse: 64 83  Resp: 18 18  Temp: 97.6 F (36.4 C)   SpO2: 100% 100%   Filed Weights   06/26/17 1657  Weight: 155 lb (70.3 kg)    GENERAL: Well-nourished well-developed; Alert, no distress and comfortable.   Alone. EYES: no pallor or icterus OROPHARYNX: no thrush or ulceration. NECK: supple, no masses felt LYMPH:   no palpable lymphadenopathy in the cervical, axillary or inguinal regions LUNGS: decreased breath sounds to auscultation at bases and  No wheeze or crackles HEART/CVS: regular rate & rhythm and no murmurs; No lower extremity edema ABDOMEN: abdomen soft, non-tender and normal bowel sounds Musculoskeletal:no cyanosis of digits and no clubbing  PSYCH: alert & oriented x 3 with fluent speech NEURO: no focal motor/sensory deficits SKIN:  no rashes or significant lesions  LABORATORY DATA:  I have reviewed the data as listed Lab Results  Component Value Date   WBC 2.3 (L) 06/27/2017   HGB 12.7 (L) 06/27/2017   HCT 39.7 (L) 06/27/2017   MCV 76.1 (L) 06/27/2017   PLT 218 06/27/2017   Recent Labs    06/24/17 1115  NA 137  K 4.0  CL 101  CO2 29  GLUCOSE 94  BUN 13  CREATININE 0.93  CALCIUM 9.5  GFRNONAA >60  GFRAA >60  PROT 8.2*  ALBUMIN 4.7  AST 39  ALT 24  ALKPHOS 46  BILITOT 1.0    RADIOGRAPHIC STUDIES: I have personally reviewed the radiological images as listed and agreed with the findings in the report. Ct Head Wo Contrast  Result Date: 06/24/2017 CLINICAL DATA:  Altered mental status. EXAM: CT HEAD WITHOUT CONTRAST TECHNIQUE: Contiguous axial images were obtained from the base of the skull through the vertex without intravenous contrast. COMPARISON:  None. FINDINGS: Brain: Ventricles, cisterns and other CSF spaces are within normal. There is no mass, mass effect, shift of midline structures or acute hemorrhage. No evidence of acute infarction. Vascular: No hyperdense vessel or unexpected calcification. Skull: Normal. Negative for fracture or focal lesion. Sinuses/Orbits: Orbits are normal. Paranasal sinuses are well developed as there is opacification over the left sphenoid sinus and minimally over the posterior left ethmoid air cells. Mastoid air cells are clear. Other: None. IMPRESSION: No acute findings. Chronic inflammatory change involving the left sphenoid sinus and  posterior ethmoid air cells. Electronically Signed   By: Marin Olp M.D.   On: 06/24/2017 16:40    ASSESSMENT & PLAN:   #27 year old male patient currently in the hospital for psychosis incidentally noted to have-neutropenia.   #Leukopenia/neutropenia-without any significant anemia or platelet disorder.  Unclear etiology [ANC- 800].  Asymptomatic.  Denies any prescription drug exposure; or alcohol or other drugs.  Recommend checking-CBC/peripheral smear review/HIV/hepatitis work-up/CRP/LDH.  Discussed that bone marrow biopsy would be recommended if above work-up is negative/or counts are progressively getting low.  Also ultrasound of the abdomen to look for  the spleen.  #Schizophrenia/psychosis-as per primary team.  I think it is reasonable to give a trial of Risperdal/Haldol-while awaiting above work-up- especially as patient needs stabilization of his mental illness.  Thank you Dr.McNew for allowing me to participate in the care of your pleasant patient. Please do not hesitate to contact me with questions or concerns in the interim.  The above plan of care was discussed with Dr. Wonda Olds.   All questions were answered.      Cammie Sickle, MD 06/27/2017 4:30 PM

## 2017-06-27 NOTE — H&P (Addendum)
Psychiatric Admission Assessment Adult  Patient Identification: Nathan Barnett. MRN:  [id number] Date of Evaluation:  06/27/2017 Chief Complaint:  Increasingly bizzare behaviors Principal Diagnosis: Schizophrenia [facility]) Diagnosis:   Patient Active Problem List   Diagnosis Date Noted  . Schizophrenia (HCC) [F20.9] 06/26/2017   History of Present Illness: 27 yo male admitted due to increase in strange behaviors. Pt states that he is in the hospital because "we agreed that I take some time and put myself together." He is not able to elaborate that much on this. He states that both him and his mother agree on this. He denies any mental health symptoms. Denies feeling depressed, suicidal, homicidal. Denies Voices or visual hallucinations. When asked about AH, he states, "I'm pretty self sufficient." Denies feeling paranoia. He states that he has a degree in Business Administration however, "I wont need it." He states that he will not have to work because he is going to inherit millions of dollars. He will not elaborate on this. When asked what he will do in his free time when he is not working, he states, "I'm a surrealist." He attempts to explain this but disorganized. He is observed responding to internal stimuli during interview, very little eye contact, and giggling to himself. He is observed walking the halls talking to himself. He is fully oriented to person, place and time. He states taht he drinks 2 beers a month. Denies other drug use. He received a few doses of Risperdal in the ED.    CSW in ED received collateral from his mother. She reported taht his mental state has changed in the last several years. Family initially thought it was due to alcohol use. About a year ago, he got into alcohol treatment but were told there is likely a primary mental illness diagnosis instead. He was inpatient at [facility] and was on medications but he stopped them after being discharged. She   Reported that he has been paranoid and believes people are trying to hurt him. HE has been going to neighbors houses and arguing with them because he feels they were talking about him. At baseline, he is well groomed and takes pride in how he looked. He completed a degree at [employer]. Recently, he has been isolating and his hygiene has decreased.   With his verbal permission, I spoke with his mother, [name]. She states that she has noticed that he seems more agitated recently. He is usually a very happy person. He seems more anxious. She has been becoming increasingly paranoid and thinking the neighbors are talking about him. HE was on another neighbors land down the road yelling and cursing at them. She states that he was hospitalized about this time last year and that helped him a lot. He was on medications but she is not sure what it was. He has not been on anything since then. She feels it has a lot to do with his diet and not eating right. We did discuss differential diagnosis to include schizophrenia. She denies that there are any family history of mental health issues.   Associated Signs/Symptoms: Depression Symptoms:  Denies symptoms (Hypo) Manic Symptoms:  Denies Anxiety Symptoms:  Denies Psychotic Symptoms:  Responding to internal stimuli PTSD Symptoms: Negative Total Time spent with patient: 45 minutes  Past Psychiatric History: No past psychiatric diagnosis. He was hospitalized once about a year ago. Was on medications during hospitalization but not after discharge. He is not on any medications currently. Denies past suicide attempts.  Is the patient at risk to self? No.  Has the patient been a risk to self in the past 6 months? No.  Has the patient been a risk to self within the distant past? No.  Is the patient a risk to others? No.  Has the patient been a risk to others in the past 6 months? No.  Has the patient been a risk to others within the distant past? No.   Alcohol Screening:  1. How often do you have a drink containing alcohol?: Never 2. How many drinks containing alcohol do you have on a typical day when you are drinking?: 1 or 2 3. How often do you have six or more drinks on one occasion?: Never AUDIT-C Score: 0 4. How often during the last year have you found that you were not able to stop drinking once you had started?: Never 5. How often during the last year have you failed to do what was normally expected from you becasue of drinking?: Never 6. How often during the last year have you needed a first drink in the morning to get yourself going after a heavy drinking session?: Never 7. How often during the last year have you had a feeling of guilt of remorse after drinking?: Never 8. How often during the last year have you been unable to remember what happened the night before because you had been drinking?: Never 9. Have you or someone else been injured as a result of your drinking?: No 10. Has a relative or friend or a doctor or another health worker been concerned about your drinking or suggested you cut down?: No Alcohol Use Disorder Identification Test Final Score (AUDIT): 0 Intervention/Follow-up: Patient Refused Substance Abuse History in the last 12 months:  No. Consequences of Substance Abuse: Legal Consequences:  DUI in the past. he states that he went to jail twice in the past due to alcohol Previous Psychotropic Medications: Yes  Psychological Evaluations: Yes  Past Medical History: History reviewed. No pertinent past medical history. History reviewed. No pertinent surgical history. Family History: History reviewed. No pertinent family history. Family Psychiatric  History: None Tobacco Screening: Have you used any form of tobacco in the last 30 days? (Cigarettes, Smokeless Tobacco, Cigars, and/or Pipes): Yes Tobacco use, Select all that apply: 4 or less cigarettes per day Are you interested in Tobacco Cessation Medications?: No, patient  refused Counseled patient on smoking cessation including recognizing danger situations, developing coping skills and basic information about quitting provided: Refused/Declined practical counseling Social History: He states that he is from [place]. Lives in [place] with his mother, 2 brothers, and step father. HE completed bachelors degree at [employer] in business admin. He is not working currently.  Social History   Substance and Sexual Activity  Alcohol Use Not on file     Social History   Substance and Sexual Activity  Drug Use Not on file    Additional Social History: Marital status: Single Are you sexually active?: No What is your sexual orientation?: Heterosexual Does patient have children?: No                         Allergies:  No Known Allergies Lab Results:  Results for orders placed or performed during the hospital encounter of 06/26/17 (from the past 48 hour(s))  Lipid panel     Status: None   Collection Time: 06/27/17  7:24 AM  Result Value Ref Range   Cholesterol 157 0 -  200 mg/dL   Triglycerides 89 <161 mg/dL   HDL 62 >09 mg/dL   Total CHOL/HDL Ratio 2.5 RATIO   VLDL 18 0 - 40 mg/dL   LDL Cholesterol 77 0 - 99 mg/dL    Comment:        Total Cholesterol/HDL:CHD Risk Coronary Heart Disease Risk Table                     Men   Women  1/2 Average Risk   3.4   3.3  Average Risk       5.0   4.4  2 X Average Risk   9.6   7.1  3 X Average Risk  23.4   11.0        Use the calculated Patient Ratio above and the CHD Risk Table to determine the patient's CHD Risk.        ATP III CLASSIFICATION (LDL):  <100     mg/dL   Optimal  604-540  mg/dL   Near or Above                    Optimal  130-159  mg/dL   Borderline  981-191  mg/dL   High  >478     mg/dL   Very High Performed at [facility], [address] [place], [place] [address]   TSH     Status: None   Collection Time: 06/27/17  7:24 AM  Result Value Ref Range   TSH 1.826 0.350 - 4.500  uIU/mL    Comment: Performed by a 3rd Generation assay with a functional sensitivity of <=0.01 uIU/mL. Performed at [facility], [address] [place], [place] [address]   CBC with Differential/Platelet     Status: Abnormal   Collection Time: 06/27/17  7:24 AM  Result Value Ref Range   WBC 2.3 (L) 3.8 - 10.6 K/uL   RBC 5.22 4.40 - 5.90 MIL/uL   Hemoglobin 12.7 (L) 13.0 - 18.0 g/dL   HCT 57.8 (L) 46.9 - 62.9 %   MCV 76.1 (L) 80.0 - 100.0 fL   MCH 24.4 (L) 26.0 - 34.0 pg   MCHC 32.1 32.0 - 36.0 g/dL   RDW 52.8 41.3 - 24.4 %   Platelets 218 150 - 440 K/uL   Neutrophils Relative % 36 %   Neutro Abs 0.8 (L) 1.4 - 6.5 K/uL   Lymphocytes Relative 52 %   Lymphs Abs 1.2 1.0 - 3.6 K/uL   Monocytes Relative 8 %   Monocytes Absolute 0.2 0.2 - 1.0 K/uL   Eosinophils Relative 3 %   Eosinophils Absolute 0.1 0 - 0.7 K/uL   Basophils Relative 1 %   Basophils Absolute 0.0 0 - 0.1 K/uL    Comment: Performed at [facility], [address] [place], [place] [address]    Blood Alcohol level:  Lab Results  Component Value Date   [facility] <10 06/24/2017    Metabolic Disorder Labs:  No results found for: HGBA1C, MPG No results found for: PROLACTIN Lab Results  Component Value Date   CHOL 157 06/27/2017   TRIG 89 06/27/2017   HDL 62 06/27/2017   CHOLHDL 2.5 06/27/2017   VLDL 18 06/27/2017   LDLCALC 77 06/27/2017    Current Medications: Current Facility-Administered Medications  Medication Dose Route Frequency Provider Last Rate Last Dose  . acetaminophen (TYLENOL) tablet 650 mg  650 mg Oral Q6H PRN Clapacs, Jackquline Denmark, MD      .  alum & mag hydroxide-simeth (MAALOX/MYLANTA) 200-200-20 MG/5ML suspension 30 mL  30 mL Oral Q4H PRN Clapacs, John T, MD      . hydrOXYzine (ATARAX/VISTARIL) tablet 25 mg  25 mg Oral TID PRN Clapacs, John T, MD      . magnesium hydroxide (MILK OF MAGNESIA) suspension 30 mL  30 mL Oral Daily PRN Clapacs, John T, MD      . traZODone (DESYREL) tablet  100 mg  100 mg Oral QHS PRN Clapacs, Jackquline Denmark, MD   100 mg at 06/26/17 2148   PTA Medications: No medications prior to admission.    Musculoskeletal: Strength & Muscle Tone: within normal limits Gait & Station: normal Patient leans: N/A  Psychiatric Specialty Exam: Physical Exam  ROS  Blood pressure 137/79, pulse 83, temperature 97.6 F (36.4 C), temperature source Oral, resp. rate 18, height  (1.854 m), weight 70.3 kg (155 lb), SpO2 100 %.Body mass index is 20.45 kg/m.  General Appearance: Casual  Eye Contact:  Minimal  Speech:  Slow  Volume:  Normal  Mood:  Euthymic  Affect:  Bizzare, laughing to himself  Thought Process:  Disorganized  Orientation:  Full (Time, Place, and Person)  Thought Content:  Illogical and Delusions  Suicidal Thoughts:  No  Homicidal Thoughts:  No  Memory:  Immediate;   Fair  Judgement:  Impaired  Insight:  Lacking  Psychomotor Activity:  Normal  Concentration:  Concentration: Poor  Recall:  [employer] of Knowledge:  Fair  Language:  Fair  Akathisia:  No      Assets:  Resilience  ADL's:  Intact  Cognition:  Impaired,  Mild  Sleep:       Treatment Plan Summary: 27 yo male admitted due to increasingly bizzare behaviors. He has been isolating and hygiene is deteriorating. He is observed responding to internal stimuli on the unit. He was given a few doses of Risperdal in the ED but has not been on any medications at home. 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  CBC shows neutropenia. He was neutropenic prior to given any Risperdal. Repeat CBC this morning continues to show neutropenia. Will hold off on starting any anti-psychotics at this time as most of them have a risk of worsening this. I have contacted heme/onc for consultations and recommendations. He will need anti-psychotic as he is definitely not functioning at his baseline.   Plan:  Schizophrenia -Will hold off on starting anti-psychotics until heme/onc  gives recommendations.   Dispo -He will return home on discharge. He will need outpatient follow up. I spoke with his mother, [name] today.   ADDENDUM: Spoke with heme/onc. He is okay with starting haldol as it has lower incidence of agranulocytosis compared to Olanzapine and Clozapine. He received a few doses of Risperdal in ED and ANC has decreaced since that. Will monitor CBC -Will also start Lithium 300 mg qhs for mood stabilization as well as trying in increase ANC  Observation Level/Precautions:  15 minute checks  Laboratory:  CBC  Psychotherapy:    Medications:    Consultations:    Discharge Concerns:    Estimated LOS: 5-7 days  Other:     Physician Treatment Plan for Primary Diagnosis: Schizophrenia (HCC) Long Term Goal(s): Improvement in symptoms so as ready for discharge  Short Term Goals: Ability to demonstrate self-control will improve  I certify that inpatient services furnished can reasonably be expected to improve the patient's condition.    Haskell Riling, MD 4/30/201911:46 AM

## 2017-06-27 NOTE — BHH Counselor (Signed)
Adult Comprehensive Assessment  Patient ID: Nathan Barnett., male   DOB: 1990-08-03, 27 y.o.   MRN: [id number]  Information Source: Information source: Patient  Current Stressors:  Employment / Job issues: Veterinary surgeon / Lack of resources (include bankruptcy): No income Substance abuse: Alcohol  Living/Environment/Situation:  Living Arrangements: Parent, Other relatives How long has patient lived in current situation?: 4 years What is atmosphere in current home: Temporary, Other (Comment)("I'm about to move")  Family History:  Marital status: Single Are you sexually active?: No What is your sexual orientation?: Heterosexual Does patient have children?: No  Childhood History:  By whom was/is the patient raised?: Mother Description of patient's relationship with caregiver when they were a child: "Fine, Congruent" Patient's description of current relationship with people who raised him/her: "Fine" How were you disciplined when you got in trouble as a child/adolescent?: "I didnt need any of that" Does patient have siblings?: Yes Number of Siblings: 2 Description of patient's current relationship with siblings: "Fine" Did patient suffer any verbal/emotional/physical/sexual abuse as a child?: No Did patient suffer from severe childhood neglect?: No Has patient ever been sexually abused/assaulted/raped as an adolescent or adult?: No Was the patient ever a victim of a crime or a disaster?: No Witnessed domestic violence?: No Has patient been effected by domestic violence as an adult?: No  Education:  Highest grade of school patient has completed: Probation officer Currently a Consulting civil engineer?: No Learning disability?: No  Employment/Work Situation:   Employment situation: Unemployed Patient's job has been impacted by current illness: No What is the longest time patient has a held a job?: 1 years Where was the patient employed at that time?: Therapist, nutritional Has  patient ever been in the [employer]?: No Are There Guns or Other Weapons in Your Home?: No  Financial Resources:   Surveyor, quantity resources: Support from parents / caregiver Does patient have a Lawyer or guardian?: No  Alcohol/Substance Abuse:   What has been your use of drugs/alcohol within the last 12 months?: Alcohol-2 40oz a month, Denies drugs If attempted suicide, did drugs/alcohol play a role in this?: No Has alcohol/substance abuse ever caused legal problems?: No  Social Support System:   Conservation officer, nature Support System: Good Describe Community Support System: Mother, Type of faith/religion: Athiest How does patient's faith help to cope with current illness?: N/A  Leisure/Recreation:   Leisure and Hobbies: None  Strengths/Needs:   What things does the patient do well?: No In what areas does patient struggle / problems for patient: "No"  Discharge Plan:   Does patient have access to transportation?: Yes(Step-father or mother) Will patient be returning to same living situation after discharge?: Yes Currently receiving community mental health services: No If no, would patient like referral for services when discharged?: Yes (What county?)([place]) Does patient have financial barriers related to discharge medications?: Yes Patient description of barriers related to discharge medications: No income, no employement  Summary/Recommendations:   Summary and Recommendations (to be completed by the evaluator): Patient is a 27 year old African American male admitted involuntarily by his mother to due to an increase in strange behavior. Patient lives alone in [place]. He reports using alcohol twice a month. His UDS was negative for all substances. His affect was blunted and constricted. At discharge, patient will return home and follow up with outpatient services. While here, patient will benefit from crisis stabilization, medication evaluation, group therapy and  psychoeducation, in addition to case management for discharge planning. At discharge, it is recommended that  patient remain compliant with the established discharge plan and continue treatment.   [name]. 06/27/2017

## 2017-06-27 NOTE — [facility] Suicide Risk Assessment (Signed)
[facility] INPATIENT:  Family/Significant Other Suicide Prevention Education  Suicide Prevention Education:  Patient Refusal for Family/Significant Other Suicide Prevention Education: The patient Nathan Barnett. has refused to provide written consent for family/significant other to be provided Family/Significant Other Suicide Prevention Education during admission and/or prior to discharge.  Physician notified.  [name] 06/27/2017, 10:06 AM

## 2017-06-27 NOTE — Progress Notes (Signed)
Recreation Therapy Notes   Date: 06/27/2017  Time: 9:30 am   Location: Craft Room   Behavioral response: N/A   Intervention Topic: Communication  Discussion/Intervention: Patient did not attend group.   Clinical Observations/Feedback:  Patient did not attend group.   Tariya Morrissette LRT/CTRS         Nathan Barnett 06/27/2017 11:40 AM 

## 2017-06-28 LAB — CBC WITH DIFFERENTIAL/PLATELET
BASOS ABS: 0 10*3/uL (ref 0–0.1)
Basophils Relative: 1 %
EOS PCT: 4 %
Eosinophils Absolute: 0.1 10*3/uL (ref 0–0.7)
HEMATOCRIT: 41.7 % (ref 40.0–52.0)
HEMOGLOBIN: 13.4 g/dL (ref 13.0–18.0)
LYMPHS ABS: 1.4 10*3/uL (ref 1.0–3.6)
LYMPHS PCT: 52 %
MCH: 24.3 pg — AB (ref 26.0–34.0)
MCHC: 32.2 g/dL (ref 32.0–36.0)
MCV: 75.5 fL — ABNORMAL LOW (ref 80.0–100.0)
Monocytes Absolute: 0.2 10*3/uL (ref 0.2–1.0)
Monocytes Relative: 8 %
NEUTROS ABS: 0.9 10*3/uL — AB (ref 1.4–6.5)
NEUTROS PCT: 35 %
PLATELETS: 236 10*3/uL (ref 150–440)
RBC: 5.52 MIL/uL (ref 4.40–5.90)
RDW: 13.6 % (ref 11.5–14.5)
WBC: 2.7 10*3/uL — ABNORMAL LOW (ref 3.8–10.6)

## 2017-06-28 LAB — EPSTEIN-BARR VIRUS VCA ANTIBODY PANEL
EBV Early Antigen Ab, IgG: <9 U/mL (ref 0.0–8.9)
EBV NA IgG: >600 U/mL — ABNORMAL HIGH (ref 0.0–17.9)
EBV VCA IgG: 418 U/mL — ABNORMAL HIGH (ref 0.0–17.9)

## 2017-06-28 LAB — HEPATITIS B SURFACE ANTIGEN: HEP B S AG: NEGATIVE

## 2017-06-28 LAB — HEPATITIS C ANTIBODY

## 2017-06-28 LAB — HEPATITIS B CORE ANTIBODY, IGM: Hep B C IgM: NEGATIVE

## 2017-06-28 LAB — HIV ANTIBODY (ROUTINE TESTING W REFLEX): HIV SCREEN 4TH GENERATION: NONREACTIVE

## 2017-06-28 NOTE — Tx Team (Addendum)
Interdisciplinary Treatment and Diagnostic Plan Update  06/28/2017 Time of Session: 11am *Nathan Barnett. MRN: [id number]  Principal Diagnosis: Schizophrenia [facility])  Secondary Diagnoses: Principal Problem:   Schizophrenia (HCC) Active Problems:   Other neutropenia (HCC)   Current Medications:  Current Facility-Administered Medications  Medication Dose Route Frequency Provider Last Rate Last Dose  . acetaminophen (TYLENOL) tablet 650 mg  650 mg Oral Q6H PRN Clapacs, John T, MD      . alum & mag hydroxide-simeth (MAALOX/MYLANTA) 200-200-20 MG/5ML suspension 30 mL  30 mL Oral Q4H PRN Clapacs, John T, MD      . haloperidol (HALDOL) tablet 5 mg  5 mg Oral QHS McNew, Holly R, MD   5 mg at 06/27/17 2124  . hydrOXYzine (ATARAX/VISTARIL) tablet 25 mg  25 mg Oral TID PRN Clapacs, John T, MD      . lithium carbonate (LITHOBID) CR tablet 300 mg  300 mg Oral QHS McNew, Ileene Hutchinson, MD   300 mg at 06/27/17 2124  . magnesium hydroxide (MILK OF MAGNESIA) suspension 30 mL  30 mL Oral Daily PRN Clapacs, John T, MD      . temazepam (RESTORIL) capsule 7.5 mg  7.5 mg Oral QHS PRN McNew, Ileene Hutchinson, MD   7.5 mg at 06/27/17 2354   PTA Medications: No medications prior to admission.    Patient Stressors: Marital or family conflict Medication change or noncompliance  Patient Strengths: Active sense of humor Supportive family/friends  Treatment Modalities: Medication Management, Group therapy, Case management,  1 to 1 session with clinician, Psychoeducation, Recreational therapy.   Physician Treatment Plan for Primary Diagnosis: Schizophrenia (HCC) Long Term Goal(s): Improvement in symptoms so as ready for discharge   Short Term Goals: Ability to demonstrate self-control will improve  Medication Management: Evaluate patient's response, side effects, and tolerance of medication regimen.  Therapeutic Interventions: 1 to 1 sessions, Unit Group sessions and Medication administration.  Evaluation of  Outcomes: Progressing  Physician Treatment Plan for Secondary Diagnosis: Principal Problem:   Schizophrenia (HCC) Active Problems:   Other neutropenia (HCC)  Long Term Goal(s): Improvement in symptoms so as ready for discharge   Short Term Goals: Ability to demonstrate self-control will improve     Medication Management: Evaluate patient's response, side effects, and tolerance of medication regimen.  Therapeutic Interventions: 1 to 1 sessions, Unit Group sessions and Medication administration.  Evaluation of Outcomes: Progressing   RN Treatment Plan for Primary Diagnosis: Schizophrenia (HCC) Long Term Goal(s): Knowledge of disease and therapeutic regimen to maintain health will improve  Short Term Goals: Ability to verbalize feelings will improve, Ability to identify and develop effective coping behaviors will improve and Compliance with prescribed medications will improve  Medication Management: RN will administer medications as ordered by provider, will assess and evaluate patient's response and provide education to patient for prescribed medication. RN will report any adverse and/or side effects to prescribing provider.  Therapeutic Interventions: 1 on 1 counseling sessions, Psychoeducation, Medication administration, Evaluate responses to treatment, Monitor vital signs and CBGs as ordered, Perform/monitor CIWA, COWS, AIMS and Fall Risk screenings as ordered, Perform wound care treatments as ordered.  Evaluation of Outcomes: Progressing   LCSW Treatment Plan for Primary Diagnosis: Schizophrenia (HCC) Long Term Goal(s): Safe transition to appropriate next level of care at discharge, Engage patient in therapeutic group addressing interpersonal concerns.  Short Term Goals: Engage patient in aftercare planning with referrals and resources, Increase social support, Facilitate acceptance of mental health diagnosis and concerns, Facilitate patient progression  through stages of change  regarding substance use diagnoses and concerns, Identify triggers associated with mental health/substance abuse issues and Increase skills for wellness and recovery  Therapeutic Interventions: Assess for all discharge needs, 1 to 1 time with Social worker, Explore available resources and support systems, Assess for adequacy in community support network, Educate family and significant other(s) on suicide prevention, Complete Psychosocial Assessment, Interpersonal group therapy.  Evaluation of Outcomes: Progressing   Progress in Treatment: Attending groups: No. Participating in groups: No. Taking medication as prescribed: Yes. Toleration medication: Yes. Family/Significant other contact made: No, will contact:  Patient refused Patient understands diagnosis: No. Discussing patient identified problems/goals with staff: Yes. Medical problems stabilized or resolved: Yes. Denies suicidal/homicidal ideation: Yes. Issues/concerns per patient self-inventory: No. Other:   New problem(s) identified: No, Describe:  None  New Short Term/Long Term Goal(s): "To work on my relaxation and to continue to the same process when I leave."  Discharge Plan or Barriers: To return back home with family and follow up with outpatient with [facility].   Reason for Continuation of Hospitalization: Medication stabilization  Estimated Length of Stay: 5-7 days  Recreational Therapy: Patient Stressors: N/A Patient Goal: Patient will engage in groups without prompting or encouragement from LRT x3 group sessions within 5 recreation therapy group sessions  Attendees: Patient: Nathan Barnett 06/28/2017 11:17 AM  Physician: Corinna Gab, MD 06/28/2017 11:17 AM  Nursing: Hulan Amato, RN 06/28/2017 11:17 AM  RN Care Manager:  06/28/2017 11:17 AM  Social Worker: [name], LCSWA 06/28/2017 11:17 AM  Recreational Therapist: Danella Deis. Dreama Saa, LRT 06/28/2017 11:17 AM  Other: Heidi Dach, LCSW 06/28/2017 11:17 AM  Other: Huey Romans, LCSW 06/28/2017 11:17 AM  Other: 06/28/2017 11:17 AM    Scribe for Treatment Team: [name], LCSW 06/28/2017 11:17 AM

## 2017-06-28 NOTE — Plan of Care (Signed)
Patient knowledgeable  of information received . Information given in concrete  formate. Improvement noted e with mental  and emotional behavior . Limited participation . No safety concerns  Problem: Education: Goal: Knowledge of [facility] Education information/materials will improve Outcome: Progressing Goal: Emotional status will improve Outcome: Progressing Goal: Mental status will improve Outcome: Progressing Goal: Verbalization of understanding the information provided will improve Outcome: Progressing   Problem: Activity: Goal: Interest or engagement in activities will improve Outcome: Progressing   Problem: Coping: Goal: Ability to verbalize frustrations and anger appropriately will improve Outcome: Progressing   Problem: Safety: Goal: Periods of time without injury will increase Outcome: Progressing   Problem: Safety: Goal: Periods of time without injury will increase Outcome: Progressing   Problem: Education: Goal: Will be free of psychotic symptoms Outcome: Progressing   Problem: Self-Care: Goal: Ability to participate in self-care as condition permits will improve Outcome: Progressing

## 2017-06-28 NOTE — Progress Notes (Signed)
Patient slept 7.30 hours. No issues.

## 2017-06-28 NOTE — Progress Notes (Signed)
[facility] MD Progress Note  06/28/2017 4:38 PM Nathan Barnett.  MRN:  [id number] Subjective:  Pt has been isolative and not interacting with others. He answers questions with irrelevant responses. When asked how he is doing he states, "I like to be faithful." He states that his stay here is "inspirational. The fact that one moment you can be here." He stares out the window during interview with no eye contact. He is still not sure why he is here. He states that eventually he wants to move to [place] "depending how much money I get." He reports sleeping well. He has been complaint with medications. When asked about meds, he states "at first aI was scared tot take them because people look like zombies." He is fine with taking them. He laughs and giggles to himself.   Principal Problem: Schizophrenia (HCC) Diagnosis:   Patient Active Problem List   Diagnosis Date Noted  . Schizophrenia (HCC) [F20.9] 06/26/2017    Priority: High  . 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 No pertinent past medical history. History reviewed. No pertinent surgical history. Family History: History reviewed. No pertinent family history. Family Psychiatric  History: See H&P Social History:  Social History   Substance and Sexual Activity  Alcohol Use Not on file     Social History   Substance and Sexual Activity  Drug Use Not on file    Social History   Socioeconomic History  . Marital status: Single    Spouse name: Not on file  . Number of children: Not on file  . Years of education: Not on file  . Highest education level: Not on file  Occupational History  . Not on file  Social Needs  . Financial resource strain: Not on file  . Food insecurity:    Worry: Not on file    Inability: Not on file  . Transportation needs:    Medical: Not on file    Non-medical: Not on file  Tobacco  Use  . Smoking status: Current Some Day Smoker    Types: Cigarettes  . Smokeless tobacco: Never Used  Substance and Sexual Activity  . Alcohol use: Not on file  . Drug use: Not on file  . Sexual activity: Not on file  Lifestyle  . Physical activity:    Days per week: Not on file    Minutes per session: Not on file  . Stress: Not on file  Relationships  . Social connections:    Talks on phone: Not on file    Gets together: Not on file    Attends religious service: Not on file    Active member of club or organization: Not on file    Attends meetings of clubs or organizations: Not on file    Relationship status: Not on file  Other Topics Concern  . Not on file  Social History Narrative  . Not on file   Additional Social History:                         Sleep: Good  Appetite:  Fair  Current Medications: Current Facility-Administered Medications  Medication Dose Route Frequency Provider Last Rate Last Dose  . acetaminophen (TYLENOL) tablet 650 mg  650 mg Oral Q6H PRN Clapacs, Jackquline Denmark, MD      . alum & mag hydroxide-simeth (MAALOX/MYLANTA) 200-200-20 MG/5ML  suspension 30 mL  30 mL Oral Q4H PRN Clapacs, John T, MD      . haloperidol (HALDOL) tablet 5 mg  5 mg Oral QHS Alazne Quant R, MD   5 mg at 06/27/17 2124  . hydrOXYzine (ATARAX/VISTARIL) tablet 25 mg  25 mg Oral TID PRN Clapacs, John T, MD      . lithium carbonate (LITHOBID) CR tablet 300 mg  300 mg Oral QHS Aryiah Monterosso, Ileene Hutchinson, MD   300 mg at 06/27/17 2124  . magnesium hydroxide (MILK OF MAGNESIA) suspension 30 mL  30 mL Oral Daily PRN Clapacs, John T, MD      . temazepam (RESTORIL) capsule 7.5 mg  7.5 mg Oral QHS PRN Elani Delph, Ileene Hutchinson, MD   7.5 mg at 06/27/17 2354    Lab Results:  Results for orders placed or performed during the hospital encounter of 06/26/17 (from the past 48 hour(s))  Hemoglobin A1c     Status: Abnormal   Collection Time: 06/27/17  7:24 AM  Result Value Ref Range   Hgb A1c MFr Bld 4.6 (L) 4.8 -  5.6 %    Comment: (NOTE) Pre diabetes:          5.7%-6.4% Diabetes:              >6.4% Glycemic control for   <7.0% adults with diabetes    Mean Plasma Glucose 85.32 mg/dL    Comment: Performed at [facility], [address], [place], [place] [address]  Lipid panel     Status: None   Collection Time: 06/27/17  7:24 AM  Result Value Ref Range   Cholesterol 157 0 - 200 mg/dL   Triglycerides 89 <272 mg/dL   HDL 62 >53 mg/dL   Total CHOL/HDL Ratio 2.5 RATIO   VLDL 18 0 - 40 mg/dL   LDL Cholesterol 77 0 - 99 mg/dL    Comment:        Total Cholesterol/HDL:CHD Risk Coronary Heart Disease Risk Table                     Men   Women  1/2 Average Risk   3.4   3.3  Average Risk       5.0   4.4  2 X Average Risk   9.6   7.1  3 X Average Risk  23.4   11.0        Use the calculated Patient Ratio above and the CHD Risk Table to determine the patient's CHD Risk.        ATP III CLASSIFICATION (LDL):  <100     mg/dL   Optimal  664-403  mg/dL   Near or Above                    Optimal  130-159  mg/dL   Borderline  474-259  mg/dL   High  >563     mg/dL   Very High Performed at [facility], [address] [place], [place] [address]   TSH     Status: None   Collection Time: 06/27/17  7:24 AM  Result Value Ref Range   TSH 1.826 0.350 - 4.500 uIU/mL    Comment: Performed by a 3rd Generation assay with a functional sensitivity of <=0.01 uIU/mL. Performed at [facility], [address], [place], [place] [address]   CBC with Differential/Platelet     Status: Abnormal   Collection Time: 06/27/17  7:24 AM  Result Value Ref Range   WBC 2.3 (L) 3.8 - 10.6 K/uL   RBC 5.22 4.40 - 5.90 MIL/uL   Hemoglobin 12.7 (L) 13.0 - 18.0 g/dL   HCT 16.1 (L) 09.6 - 04.5 %   MCV 76.1 (L) 80.0 - 100.0 fL   MCH 24.4 (L) 26.0 - 34.0 pg   MCHC 32.1 32.0 - 36.0 g/dL   RDW 40.9 81.1 - 91.4 %   Platelets 218 150 - 440 K/uL   Neutrophils Relative % 36 %   Neutro Abs 0.8 (L) 1.4 - 6.5  K/uL   Lymphocytes Relative 52 %   Lymphs Abs 1.2 1.0 - 3.6 K/uL   Monocytes Relative 8 %   Monocytes Absolute 0.2 0.2 - 1.0 K/uL   Eosinophils Relative 3 %   Eosinophils Absolute 0.1 0 - 0.7 K/uL   Basophils Relative 1 %   Basophils Absolute 0.0 0 - 0.1 K/uL    Comment: Performed at [facility], [address] [place], [place] [address]  Vitamin B12     Status: Abnormal   Collection Time: 06/27/17  7:24 AM  Result Value Ref Range   Vitamin B-12 1,275 (H) 180 - 914 pg/mL    Comment: (NOTE) This assay is not validated for testing neonatal or myeloproliferative syndrome specimens for Vitamin B12 levels. Performed at [facility], [address] [place], [place] [address]   Folate     Status: None   Collection Time: 06/27/17  7:24 AM  Result Value Ref Range   Folate 15.5 >5.9 ng/mL    Comment: Performed at [facility], [address] [place], [place] [address]  CBC with Differential     Status: Abnormal   Collection Time: 06/27/17  4:55 PM  Result Value Ref Range   WBC 3.4 (L) 3.8 - 10.6 K/uL   RBC 5.15 4.40 - 5.90 MIL/uL   Hemoglobin 12.5 (L) 13.0 - 18.0 g/dL   HCT 46.9 (L) 62.9 - 52.8 %   MCV 75.3 (L) 80.0 - 100.0 fL   MCH 24.2 (L) 26.0 - 34.0 pg   MCHC 32.2 32.0 - 36.0 g/dL   RDW 41.3 24.4 - 01.0 %   Platelets 219 150 - 440 K/uL   Neutrophils Relative % 44 %   Neutro Abs 1.5 1.4 - 6.5 K/uL   Lymphocytes Relative 46 %   Lymphs Abs 1.6 1.0 - 3.6 K/uL   Monocytes Relative 6 %   Monocytes Absolute 0.2 0.2 - 1.0 K/uL   Eosinophils Relative 3 %   Eosinophils Absolute 0.1 0 - 0.7 K/uL   Basophils Relative 1 %   Basophils Absolute 0.0 0 - 0.1 K/uL    Comment: Performed at [facility], [address], [place], [place] [address]  Technologist smear review     Status: None   Collection Time: 06/27/17  4:55 PM  Result Value Ref Range   Tech Review HYPOCHROMIA     Comment: MICROCYTOSIS LARGE PLATELETS SEEN Performed at [facility], [address] [place], [place] [address]   HIV antibody     Status: None   Collection Time: 06/27/17  4:55 PM  Result Value Ref Range   HIV Screen 4th Generation wRfx Non Reactive Non Reactive    Comment: (NOTE) Performed At: [facility] [address] [place], [place] [id number] Jolene Schimke MD [id number] Performed at [facility], [address] [place], [place] [address]   Hepatitis B surface antigen  Status: None   Collection Time: 06/27/17  4:55 PM  Result Value Ref Range   Hepatitis B Surface Ag Negative Negative    Comment: (NOTE) Performed At: [facility] [address] [place], [place] [id number] Jolene Schimke MD [id number] Performed at [facility], [address] [place], [place] [address]   Hepatitis C antibody     Status: None   Collection Time: 06/27/17  4:55 PM  Result Value Ref Range   HCV Ab <0.1 0.0 - 0.9 s/co ratio    Comment: (NOTE)                                  Negative:     < 0.8                             Indeterminate: 0.8 - 0.9                                  Positive:     > 0.9 The CDC recommends that a positive HCV antibody result be followed up with a HCV Nucleic Acid Amplification test (253664). Performed At: [facility] [address] [place], [place] [id number] Jolene Schimke MD [id number] Performed at [facility], [address] [place], [place] [address]   Hepatitis B core antibody, IgM     Status: None   Collection Time: 06/27/17  4:55 PM  Result Value Ref Range   Hep B C IgM Negative Negative    Comment: (NOTE) Performed At: [facility] [address] [place], [place] [id number] Jolene Schimke MD [id number] Performed at [facility], [address] [place], [place] [address]   Lactate dehydrogenase     Status: None   Collection Time: 06/27/17  4:55 PM  Result Value Ref Range   LDH 142 98 - 192 U/L     Comment: Performed at [facility], [address] [place], [place] [address]  C-reactive protein     Status: None   Collection Time: 06/27/17  4:55 PM  Result Value Ref Range   CRP <0.8 <1.0 mg/dL    Comment: Performed at [facility], [address] [place], [place] [address]  CBC with Differential/Platelet     Status: Abnormal   Collection Time: 06/28/17  7:12 AM  Result Value Ref Range   WBC 2.7 (L) 3.8 - 10.6 K/uL   RBC 5.52 4.40 - 5.90 MIL/uL   Hemoglobin 13.4 13.0 - 18.0 g/dL   HCT 31.5 17.6 - 16.0 %   MCV 75.5 (L) 80.0 - 100.0 fL   MCH 24.3 (L) 26.0 - 34.0 pg   MCHC 32.2 32.0 - 36.0 g/dL   RDW 73.7 10.6 - 26.9 %   Platelets 236 150 - 440 K/uL   Neutrophils Relative % 35 %   Neutro Abs 0.9 (L) 1.4 - 6.5 K/uL   Lymphocytes Relative 52 %   Lymphs Abs 1.4 1.0 - 3.6 K/uL   Monocytes Relative 8 %   Monocytes Absolute 0.2 0.2 - 1.0 K/uL   Eosinophils Relative 4 %   Eosinophils Absolute 0.1 0 - 0.7 K/uL   Basophils Relative 1 %   Basophils Absolute 0.0 0 - 0.1 K/uL    Comment: Performed at [facility], [address]  [place], [place] [address]    Blood Alcohol level:  Lab Results  Component Value Date   ETH <10 06/24/2017    Metabolic Disorder Labs: Lab Results  Component Value Date   HGBA1C 4.6 (L) 06/27/2017   MPG 85.32 06/27/2017   No results found for: PROLACTIN Lab Results  Component Value Date   CHOL 157 06/27/2017   TRIG 89 06/27/2017   HDL 62 06/27/2017   CHOLHDL 2.5 06/27/2017   VLDL 18 06/27/2017   LDLCALC 77 06/27/2017    Physical Findings: AIMS: Facial and Oral Movements Muscles of Facial Expression: None, normal Lips and Perioral Area: None, normal Jaw: None, normal Tongue: None, normal,Extremity Movements Upper (arms, wrists, hands, fingers): None, normal Lower (legs, knees, ankles, toes): None, normal, Trunk Movements Neck, shoulders, hips: None, normal, Overall Severity Severity of abnormal movements (highest  score from questions above): None, normal Incapacitation due to abnormal movements: None, normal Patient's awareness of abnormal movements (rate only patient's report): No Awareness, Dental Status Current problems with teeth and/or dentures?: Yes Does patient usually wear dentures?: No  CIWA:    COWS:     Musculoskeletal: Strength & Muscle Tone: within normal limits Gait & Station: normal Patient leans: N/A  Psychiatric Specialty Exam: Physical Exam  Nursing note and vitals reviewed.   Review of Systems  All other systems reviewed and are negative.   Blood pressure 132/84, pulse 84, temperature 97.7 F (36.5 C), temperature source Oral, resp. rate 18, height  (1.854 m), weight 70.3 kg (155 lb), SpO2 100 %.Body mass index is 20.45 kg/m.  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 He was started on Haldol last night. He continues to have low WBC and ANC but slightly better. Will check again tomorrow. HE is also on Lithium to try to increase WBC. He continues to be bizzare and disorganized.   Plan:  Schizophrenia -Continue Haldol 5 mg qhs and monitor CBC -Continue Lithium 300 mg qhs  Neutropenia -Heme/Onc following. Workup so far negative. No worsening of ANC at this time  Dispo' -He will return home with his mother.   Haskell Riling, MD 06/28/2017, 4:38 PM

## 2017-06-28 NOTE — [facility] Group Notes (Signed)
LCSW Group Therapy Note  06/28/2017 1:00 PM  Type of Therapy/Topic:  Group Therapy:  Emotion Regulation  Participation Level:  Did Not Attend   Description of Group:    The purpose of this group is to assist patients in learning to regulate negative emotions and experience positive emotions. Patients will be guided to discuss ways in which they have been vulnerable to their negative emotions. These vulnerabilities will be juxtaposed with experiences of positive emotions or situations, and patients will be challenged to use positive emotions to combat negative ones. Special emphasis will be placed on coping with negative emotions in conflict situations, and patients will process healthy conflict resolution skills.  Therapeutic Goals: 1. Patient will identify two positive emotions or experiences to reflect on in order to balance out negative emotions 2. Patient will label two or more emotions that they find the most difficult to experience 3. Patient will demonstrate positive conflict resolution skills through discussion and/or role plays  Summary of Patient Progress:  Nathan Barnett was invited to today's group, but chose not to attend.     Therapeutic Modalities:   Cognitive Behavioral Therapy Feelings Identification Dialectical Behavioral Therapy

## 2017-06-28 NOTE — Progress Notes (Signed)
Recreation Therapy Notes  Date: 06/28/2017  Time: 9:30 am   Location: Craft Room   Behavioral response: N/A   Intervention Topic: Values  Discussion/Intervention: Patient did not attend group.   Clinical Observations/Feedback:  Patient did not attend group.   Laurabeth Yip LRT/CTRS        Nathan Barnett 06/28/2017 10:37 AM 

## 2017-06-28 NOTE — Progress Notes (Signed)
Patient denies any acute pain. Patient was pacing at the beginning of this shift. Denies any SI/AVH.  Requested for Restoril 7.5 mg oral prn  for insomnia.  Will continue 15 minutes checks.

## 2017-06-28 NOTE — Progress Notes (Signed)
D:Patient knowledgeable  of information received . Information given in concrete  formate. Improvement noted  with mental  and emotional behavior . Limited participation . No safety concerns  Patient continues to walk the halls during  Most of shift . Noted respectful  Limited interaction  With peers and staff    Thought process remained  Altered  Noted to  Respond to internal stimuli  Appetite good   A: Encourage patient participation with unit programming . Instruction  Given on  Medication , verbalize understanding. R: Voice no other concerns. Staff continue to monitor

## 2017-06-29 LAB — CBC WITH DIFFERENTIAL/PLATELET
Basophils Absolute: 0 10*3/uL (ref 0–0.1)
Basophils Relative: 1 %
EOS ABS: 0.1 10*3/uL (ref 0–0.7)
Eosinophils Relative: 4 %
HCT: 41.5 % (ref 40.0–52.0)
HEMOGLOBIN: 13.3 g/dL (ref 13.0–18.0)
LYMPHS ABS: 1.7 10*3/uL (ref 1.0–3.6)
Lymphocytes Relative: 58 %
MCH: 24.3 pg — AB (ref 26.0–34.0)
MCHC: 32.1 g/dL (ref 32.0–36.0)
MCV: 75.6 fL — ABNORMAL LOW (ref 80.0–100.0)
Monocytes Absolute: 0.2 10*3/uL (ref 0.2–1.0)
Monocytes Relative: 8 %
Neutro Abs: 0.9 10*3/uL — ABNORMAL LOW (ref 1.4–6.5)
Neutrophils Relative %: 29 %
Platelets: 233 10*3/uL (ref 150–440)
RBC: 5.49 MIL/uL (ref 4.40–5.90)
RDW: 13.6 % (ref 11.5–14.5)
WBC: 2.9 10*3/uL — AB (ref 3.8–10.6)

## 2017-06-29 MED ORDER — HALOPERIDOL 5 MG PO TABS
10.0000 mg | ORAL_TABLET | Freq: Every day | ORAL | Status: DC
  Administered 2017-06-29 – 2017-07-03 (×5): 10 mg via ORAL
  Filled 2017-06-29 (×5): qty 2

## 2017-06-29 NOTE — Progress Notes (Signed)
Patient alert and oriented x 3 with periods of confusion to situation, patient denies SI/HI/AVH but noted responding to self and pacing on the unit. Patient's thoughts are disorganized, speech is tangential and sometimes not logical or coherent. Patent was frequently redirected for safety no distress noted.Patient is complaint with medication, did not attend evening group. Support and encouragement given to patient, 15 minutes safety rounds maintained will continue to monitor.

## 2017-06-29 NOTE — Plan of Care (Signed)
Denies SI/HI/AVH although noted pacing halls laughing and talking to unseen person.  Up to dayroom for meals.  No group attendance. No outbursts.  Maintaining personal care chores.  Support offered.  Safety maintained.

## 2017-06-29 NOTE — [facility] Group Notes (Signed)
  06/29/2017  Time: 1PM  Type of Therapy/Topic:  Group Therapy:  Balance in Life  Participation Level:  Did Not Attend  Description of Group:   This group will address the concept of balance and how it feels and looks when one is unbalanced. Patients will be encouraged to process areas in their lives that are out of balance and identify reasons for remaining unbalanced. Facilitators will guide patients in utilizing problem-solving interventions to address and correct the stressor making their life unbalanced. Understanding and applying boundaries will be explored and addressed for obtaining and maintaining a balanced life. Patients will be encouraged to explore ways to assertively make their unbalanced needs known to significant others in their lives, using other group members and facilitator for support and feedback.  Therapeutic Goals: 1. Patient will identify two or more emotions or situations they have that consume much of in their lives. 2. Patient will identify signs/triggers that life has become out of balance:  3. Patient will identify two ways to set boundaries in order to achieve balance in their lives:  4. Patient will demonstrate ability to communicate their needs through discussion and/or role plays  Summary of Patient Progress: Pt was invited to attend group but chose not to attend. CSW will continue to encourage pt to attend group throughout their admission.    Therapeutic Modalities:   Cognitive Behavioral Therapy Solution-Focused Therapy Assertiveness Training  Heidi Dach, MSW, LCSW Clinical Social Worker 06/29/2017 2:00 PM

## 2017-06-29 NOTE — Progress Notes (Signed)
[facility] MD Progress Note  06/29/2017 4:36 PM Nathan Barnett Nathan Barnett.  MRN:  [id number] Subjective:  Pt has been calm on the unit. He has not been interacting with other peers at all. He is not responding to internal stimuli as much as on admission. He has slightly better eye contact today. He is still bizzare and not really able to answer questions appropriately. He is irritable about being in the hospital. He states, "I'm not having any dangerous thoughts." He talks briefly about how it is going to inherit money and then maybe move to [place]. He does not go into any more detail. When confronted about the fact that it may be possible that this may not be true, he states, "Well some people may not know." He denies SI or any thoughts of self harm. He states that the medication "helps me sleep." Denies HI. He states that he is not going to follow up or take medications when he leaves. We discussed the IVC and then he said he would be willing to follow up if that means he gets to go home soon. He becomes irritated when he knows he's not discharging today and asks to leave the interview.   I spoke with his mother today to provide update. He gave me verbal permission to contact her. She stats that she thinks he had a dream about inheriting money or getting a lot of money and that is why he is talking about this. She plans to visist him tonight. \  Principal Problem: Schizophrenia (HCC) Diagnosis:   Patient Active Problem List   Diagnosis Date Noted  . Schizophrenia (HCC) [F20.9] 06/26/2017    Priority: High  . 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 No pertinent past medical history. History reviewed. No pertinent surgical history. Family History: History reviewed. No pertinent family history. Family Psychiatric  History: SEE H&P Social History:  Social History   Substance and Sexual  Activity  Alcohol Use Not on file     Social History   Substance and Sexual Activity  Drug Use Not on file    Social History   Socioeconomic History  . Marital status: Single    Spouse name: Not on file  . Number of children: Not on file  . Years of education: Not on file  . Highest education level: Not on file  Occupational History  . Not on file  Social Needs  . Financial resource strain: Not on file  . Food insecurity:    Worry: Not on file    Inability: Not on file  . Transportation needs:    Medical: Not on file    Non-medical: Not on file  Tobacco Use  . Smoking status: Current Some Day Smoker    Types: Cigarettes  . Smokeless tobacco: Never Used  Substance and Sexual Activity  . Alcohol use: Not on file  . Drug use: Not on file  . Sexual activity: Not on file  Lifestyle  . Physical activity:    Days per week: Not on file    Minutes per session: Not on file  . Stress: Not on file  Relationships  . Social connections:    Talks on phone: Not on file    Gets together: Not on file    Attends religious service: Not on file    Active member of club or organization: Not on file  Attends meetings of clubs or organizations: Not on file    Relationship status: Not on file  Other Topics Concern  . Not on file  Social History Narrative  . Not on file   Additional Social History:                         Sleep: Good  Appetite:  Good  Current Medications: Current Facility-Administered Medications  Medication Dose Route Frequency Provider Last Rate Last Dose  . acetaminophen (TYLENOL) tablet 650 mg  650 mg Oral Q6H PRN Clapacs, John T, MD      . alum & mag hydroxide-simeth (MAALOX/MYLANTA) 200-200-20 MG/5ML suspension 30 mL  30 mL Oral Q4H PRN Clapacs, John T, MD      . haloperidol (HALDOL) tablet 10 mg  10 mg Oral QHS Daivion Pape R, MD      . hydrOXYzine (ATARAX/VISTARIL) tablet 25 mg  25 mg Oral TID PRN Clapacs, Jackquline Denmark, MD   25 mg at 06/28/17 2116   . lithium carbonate (LITHOBID) CR tablet 300 mg  300 mg Oral QHS Hamish Banks, Ileene Hutchinson, MD   300 mg at 06/28/17 2116  . magnesium hydroxide (MILK OF MAGNESIA) suspension 30 mL  30 mL Oral Daily PRN Clapacs, John T, MD      . temazepam (RESTORIL) capsule 7.5 mg  7.5 mg Oral QHS PRN Haskell Riling, MD   7.5 mg at 06/28/17 2116    Lab Results:  Results for orders placed or performed during the hospital encounter of 06/26/17 (from the past 48 hour(s))  CBC with Differential     Status: Abnormal   Collection Time: 06/27/17  4:55 PM  Result Value Ref Range   WBC 3.4 (L) 3.8 - 10.6 K/uL   RBC 5.15 4.40 - 5.90 MIL/uL   Hemoglobin 12.5 (L) 13.0 - 18.0 g/dL   HCT 16.1 (L) 09.6 - 04.5 %   MCV 75.3 (L) 80.0 - 100.0 fL   MCH 24.2 (L) 26.0 - 34.0 pg   MCHC 32.2 32.0 - 36.0 g/dL   RDW 40.9 81.1 - 91.4 %   Platelets 219 150 - 440 K/uL   Neutrophils Relative % 44 %   Neutro Abs 1.5 1.4 - 6.5 K/uL   Lymphocytes Relative 46 %   Lymphs Abs 1.6 1.0 - 3.6 K/uL   Monocytes Relative 6 %   Monocytes Absolute 0.2 0.2 - 1.0 K/uL   Eosinophils Relative 3 %   Eosinophils Absolute 0.1 0 - 0.7 K/uL   Basophils Relative 1 %   Basophils Absolute 0.0 0 - 0.1 K/uL    Comment: Performed at [facility], [address] [place], [place] [address]  Technologist smear review     Status: None   Collection Time: 06/27/17  4:55 PM  Result Value Ref Range   Tech Review HYPOCHROMIA     Comment: MICROCYTOSIS LARGE PLATELETS SEEN Performed at [facility], [address] [place], [place] [address]   HIV antibody     Status: None   Collection Time: 06/27/17  4:55 PM  Result Value Ref Range   HIV Screen 4th Generation wRfx Non Reactive Non Reactive    Comment: (NOTE) Performed At: [facility] [address] [place], [place] [id number] Jolene Schimke MD [id number] Performed at [facility], [address] [place], [place] [address]   Hepatitis B surface antigen     Status: None    Collection Time: 06/27/17  4:55 PM  Result Value Ref Range   Hepatitis B Surface Ag Negative Negative    Comment: (NOTE) Performed At: [facility] [address] [place], [place] [id number] Jolene Schimke MD [id number] Performed at [facility], [address] [place], [place] [address]   Hepatitis C antibody     Status: None   Collection Time: 06/27/17  4:55 PM  Result Value Ref Range   HCV Ab <0.1 0.0 - 0.9 s/co ratio    Comment: (NOTE)                                  Negative:     < 0.8                             Indeterminate: 0.8 - 0.9                                  Positive:     > 0.9 The CDC recommends that a positive HCV antibody result be followed up with a HCV Nucleic Acid Amplification test (132440). Performed At: [facility] [address] [place], [place] [id number] Jolene Schimke MD [id number] Performed at [facility], [address] [place], [place] [address]   Hepatitis B core antibody, IgM     Status: None   Collection Time: 06/27/17  4:55 PM  Result Value Ref Range   Hep B C IgM Negative Negative    Comment: (NOTE) Performed At: [facility] [address] [place], [place] [id number] Jolene Schimke MD [id number] Performed at [facility], [address] [place], [place] [address]   Lactate dehydrogenase     Status: None   Collection Time: 06/27/17  4:55 PM  Result Value Ref Range   LDH 142 98 - 192 U/L    Comment: Performed at [facility], [address] [place], [place] [address]  Epstein-Barr virus VCA antibody panel     Status: Abnormal   Collection Time: 06/27/17  4:55 PM  Result Value Ref Range   EBV VCA IgG 418.0 (H) 0.0 - 17.9 U/mL    Comment: (NOTE)                                 Negative        <18.0                                 Equivocal 18.0 - 21.9                                 Positive        >21.9    EBV VCA IgM <36.0 0.0 - 35.9 U/mL     Comment: (NOTE)                                 Negative        <36.0  Equivocal 36.0 - 43.9                                 Positive        >43.9    EBV NA IgG >600.0 (H) 0.0 - 17.9 U/mL    Comment: (NOTE)                                 Negative        <18.0                                 Equivocal 18.0 - 21.9                                 Positive        >21.9    EBV Early Antigen Ab, IgG <9.0 0.0 - 8.9 U/mL    Comment: (NOTE)                                 Negative        < 9.0                                 Equivocal  9.0 - 10.9                                 Positive        >10.9    Interpretation: Comment     Comment: (NOTE)               EBV Interpretation Chart Interpretation   EBV-IgM  EA(D)-IgG  VCA-IgG  EBNA-IgG EBV Seronegative    -        -         -          - Early Phase         +        -         -          - Acute Primary       +       +or-       +          - Infection Convalescence/Past  -       +or-       +          + Infection Reactivated        +or-     +or-       +          + Infection       + Antibody Present      - Antibody Absent Performed At: [facility] [address] [place], [place] [id number] Jolene Schimke MD WU:[id number] Performed at [facility], [address] [place], [place] [address]   C-reactive protein     Status: None   Collection Time: 06/27/17  4:55 PM  Result Value Ref Range   CRP <0.8 <1.0 mg/dL    Comment: Performed at  [facility], [address], [place], [place] [address]  CBC with Differential/Platelet     Status: Abnormal   Collection Time: 06/28/17  7:12 AM  Result Value Ref Range   WBC 2.7 (L) 3.8 - 10.6 K/uL   RBC 5.52 4.40 - 5.90 MIL/uL   Hemoglobin 13.4 13.0 - 18.0 g/dL   HCT 60.4 54.0 - 98.1 %   MCV 75.5 (L) 80.0 - 100.0 fL   MCH 24.3 (L) 26.0 - 34.0 pg   MCHC 32.2 32.0 - 36.0 g/dL   RDW 19.1 47.8 - 29.5 %   Platelets 236 150 - 440 K/uL    Neutrophils Relative % 35 %   Neutro Abs 0.9 (L) 1.4 - 6.5 K/uL   Lymphocytes Relative 52 %   Lymphs Abs 1.4 1.0 - 3.6 K/uL   Monocytes Relative 8 %   Monocytes Absolute 0.2 0.2 - 1.0 K/uL   Eosinophils Relative 4 %   Eosinophils Absolute 0.1 0 - 0.7 K/uL   Basophils Relative 1 %   Basophils Absolute 0.0 0 - 0.1 K/uL    Comment: Performed at [facility], [address] [place], [place] [address]  CBC with Differential/Platelet     Status: Abnormal   Collection Time: 06/29/17  6:27 AM  Result Value Ref Range   WBC 2.9 (L) 3.8 - 10.6 K/uL   RBC 5.49 4.40 - 5.90 MIL/uL   Hemoglobin 13.3 13.0 - 18.0 g/dL   HCT 86.5 78.4 - 69.6 %   MCV 75.6 (L) 80.0 - 100.0 fL   MCH 24.3 (L) 26.0 - 34.0 pg   MCHC 32.1 32.0 - 36.0 g/dL   RDW 29.5 28.4 - 13.2 %   Platelets 233 150 - 440 K/uL   Neutrophils Relative % 29 %   Neutro Abs 0.9 (L) 1.4 - 6.5 K/uL   Lymphocytes Relative 58 %   Lymphs Abs 1.7 1.0 - 3.6 K/uL   Monocytes Relative 8 %   Monocytes Absolute 0.2 0.2 - 1.0 K/uL   Eosinophils Relative 4 %   Eosinophils Absolute 0.1 0 - 0.7 K/uL   Basophils Relative 1 %   Basophils Absolute 0.0 0 - 0.1 K/uL    Comment: Performed at [facility], [address] [place], [place] [address]    Blood Alcohol level:  Lab Results  Component Value Date   [facility] <10 06/24/2017    Metabolic Disorder Labs: Lab Results  Component Value Date   HGBA1C 4.6 (L) 06/27/2017   MPG 85.32 06/27/2017   No results found for: PROLACTIN Lab Results  Component Value Date   CHOL 157 06/27/2017   TRIG 89 06/27/2017   HDL 62 06/27/2017   CHOLHDL 2.5 06/27/2017   VLDL 18 06/27/2017   LDLCALC 77 06/27/2017    Physical Findings: AIMS: Facial and Oral Movements Muscles of Facial Expression: None, normal Lips and Perioral Area: None, normal Jaw: None, normal Tongue: None, normal,Extremity Movements Upper (arms, wrists, hands, fingers): None, normal Lower (legs, knees, ankles, toes): None,  normal, Trunk Movements Neck, shoulders, hips: None, normal, Overall Severity Severity of abnormal movements (highest score from questions above): None, normal Incapacitation due to abnormal movements: None, normal Patient's awareness of abnormal movements (rate only patient's report): No Awareness, Dental Status Current problems with teeth and/or dentures?: Yes Does patient usually wear dentures?: No  CIWA:    COWS:     Musculoskeletal: Strength & Muscle Tone: within normal limits Gait & Station: normal Patient leans: N/A  Psychiatric Specialty Exam: Physical Exam  Nursing note and vitals reviewed.   Review of Systems  All other systems reviewed and are negative.   Blood pressure 115/77, pulse 85, temperature 97.7 F (36.5 C), temperature source Oral, resp. rate 20, height  (1.854 m), weight 70.3 kg (155 lb), SpO2 100 %.Body mass index is 20.45 kg/m.  General Appearance: Casual, good hygiene  Eye Contact:  Fair-better  Speech:  Clear and Coherent  Volume:  Normal  Mood:  Euthymic  Affect:  Appropriate  Thought Process:  Irrelevant  Orientation:  Full (Time, Place, and Person)  Thought Content:  Illogical and Delusions  Suicidal Thoughts:  No  Homicidal Thoughts:  No  Memory:  Immediate;   Fair  Judgement:  Impaired  Insight:  Lacking  Psychomotor Activity:  Normal  Concentration:  Concentration: Poor  Recall:  Poor  Fund of Knowledge:  Fair  Language:  Fair  Akathisia:  No      Assets:  Resilience  ADL's:  Intact  Cognition:  WNL  Sleep:  Number of Hours: 7.15     Treatment Plan Summary: 26 yo male with 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 He has very poor insight into his diagnosis. Also discussed with his mother about possible schizophrenia. She does not seem to accept this as well. He has not been violent or unsafe on the unit. He has been taking medications.   Plan:  Schizophrenia -Increase Haldol to 10 mg qhs. ANC has been stable. Will monitor every  other day now to avoid excessive blood draws -Continue Lithium 300 mg qhs  Neutropenia -Oncology signing off. Workup has been negative and ANC remains stable -He will follow up in one month  Dispo -He will discharge home when stable.  Haskell Riling, MD 06/29/2017, 4:36 PM

## 2017-06-29 NOTE — Progress Notes (Signed)
Recreation Therapy Notes  INPATIENT RECREATION THERAPY ASSESSMENT  Patient Details Name: Bronte Kropf. MRN: [id number] DOB: Nov 03, 1990 Today's Date: 06/29/2017  Would not complete.       Information Obtained From:    Able to Participate in Assessment/Interview:    Patient Presentation:    Reason for Admission (Per Patient):    Patient Stressors:    Coping Skills:      Leisure Interests (2+):     Frequency of Recreation/Participation:    Awareness of Community Resources:     [employer]:     Current Use:    If no, Barriers?:    Expressed Interest in [employer] Information:    [place] of Residence:     Patient Main Form of Transportation:    Patient Strengths:     Patient Identified Areas of Improvement:     Patient Goal for Hospitalization:     Current SI (including self-harm):     Current HI:     Current AVH:    Staff Intervention Plan:    Consent to Intern Participation:    [place] Flett 06/29/2017, 11:14 AM

## 2017-06-29 NOTE — Plan of Care (Signed)
Patient is improving engaging with peers and staff and asked information about treatment plan.

## 2017-06-29 NOTE — Plan of Care (Signed)
  Problem: Education: Goal: Knowledge of [place] [facility] Education information/materials will improve Outcome: Progressing   Problem: Education: Goal: Mental status will improve Outcome: Progressing  Patient is interacting with staff and peers appropriately, he provided accurate information.

## 2017-06-29 NOTE — Progress Notes (Signed)
Recreation Therapy Notes  Date: 06/29/2017  Time: 9:30 am   Location: Craft Room   Behavioral response: N/A   Intervention Topic: Creative Expressions  Discussion/Intervention: Patient did not attend group.   Clinical Observations/Feedback:  Patient did not attend group.   Helaine Yackel LRT/CTRS        Khloi Rawl 06/29/2017 10:18 AM 

## 2017-06-29 NOTE — BHH Group Notes (Signed)
LCSW Group Therapy Note 06/29/2017 9:00 AM  Type of Therapy and Topic:  Group Therapy:  Setting Goals  Participation Level:  Did Not Attend  Description of Group: In this process group, patients discussed using strengths to work toward goals and address challenges.  Patients identified two positive things about themselves and one goal they were working on.  Patients were given the opportunity to share openly and support each other's plan for self-empowerment.  The group discussed the value of gratitude and were encouraged to have a daily reflection of positive characteristics or circumstances.  Patients were encouraged to identify a plan to utilize their strengths to work on current challenges and goals.  Therapeutic Goals 1. Patient will verbalize personal strengths/positive qualities and relate how these can assist with achieving desired personal goals 2. Patients will verbalize affirmation of peers plans for personal change and goal setting 3. Patients will explore the value of gratitude and positive focus as related to successful achievement of goals 4. Patients will verbalize a plan for regular reinforcement of personal positive qualities and circumstances.  Summary of Patient Progress:  Ahad was invited to today's group, but chose not to attend.     Therapeutic Modalities Cognitive Behavioral Therapy Motivational Interviewing    Alease Frame, [place] 06/29/2017 2:13 PM

## 2017-06-29 NOTE — Progress Notes (Signed)
Vara Guardian [place].   DOB:09-26-1990   ZO#:[id number]    Subjective: Patient denies any fevers.  Denies any chills.  Appetite is fair.  Eager to go home.  Review of system: No nausea no vomiting.  Abdominal pain.  Objective:  Vitals:   06/28/17 0609 06/29/17 0613  BP: 132/84 115/77  Pulse: 84 85  Resp: 20 20  Temp: 97.7 F (36.5 C) 97.7 F (36.5 C)  SpO2:  100%     Intake/Output Summary (Last 24 hours) at 06/29/2017 0845 Last data filed at 06/29/2017 9811 Gross per 24 hour  Intake 1200 ml  Output -  Net 1200 ml    GENERAL: Well-nourished well-developed; Alert, no distress and comfortable.   Alone. EYES: no pallor or icterus OROPHARYNX: no thrush or ulceration. NECK: supple, no masses felt LYMPH:  no palpable lymphadenopathy in the cervical, axillary or inguinal regions LUNGS: decreased breath sounds to auscultation at bases and  No wheeze or crackles HEART/CVS: regular rate & rhythm and no murmurs; No lower extremity edema ABDOMEN: abdomen soft, non-tender and normal bowel sounds Musculoskeletal:no cyanosis of digits and no clubbing  PSYCH: alert & oriented x 3 with fluent speech NEURO: no focal motor/sensory deficits SKIN:  no rashes or significant lesions     Labs:  Lab Results  Component Value Date   WBC 2.9 (L) 06/29/2017   HGB 13.3 06/29/2017   HCT 41.5 06/29/2017   MCV 75.6 (L) 06/29/2017   PLT 233 06/29/2017   NEUTROABS 0.9 (L) 06/29/2017    Lab Results  Component Value Date   NA 137 06/24/2017   K 4.0 06/24/2017   CL 101 06/24/2017   CO2 29 06/24/2017    Studies:  US Spleen (abdomen Limited)  Result Date: 06/27/2017 CLINICAL DATA:  ?  Splenomegaly EXAM: ULTRASOUND ABDOMEN LIMITED COMPARISON:  None. FINDINGS: No focal splenic abnormality. The spleen measures 8.7 by 4.7 x 10.5 cm with calculated volume of 225.5 cubic cm. IMPRESSION: Spleen is within normal limits. Electronically Signed   By: Jasmine Pang M.D.   On: 06/27/2017 20:22    Assessment  & Plan:   #27 year old male patient currently in the hospital for psychosis incidentally noted to have-neutropenia.   #Leukopenia/neutropenia-without any significant anemia or platelet disorder. Unclear etiology [ANC- 800 -1200].    Asymptomatic.  Work-up including ultrasound spleen normal; CRP/hepatitis work-up negative.  EBV antibody titers positive; but suggestive of chronic/prior infection rather than any acute process.   #Schizophrenia/psychosis-as per primary team-patient currently on Haldol.  #Discussed with Dr. Clent Ridges to discharge patient from hematology standpoint; recommend follow-up in the cancer center in approximately 1 month with regards to follow-up of neutropenia.   Earna Coder, MD 06/29/2017  8:45 AM

## 2017-06-30 NOTE — Progress Notes (Signed)
Patient alert and oriented x 3 with periods of confusion to situation,, patient denies SI/HI/AVH but noted responding to Internal stimuli and pacing on the unit. Patient's thoughts are disorganized, speech is tangential and sometimes incoherent. Patient is complaint with medication, did not attend evening group. Support and encouragement given to patient, 15 minutes safety rounds maintained will continue to monitor.

## 2017-06-30 NOTE — Plan of Care (Signed)
  Problem: Education: Goal: Mental status will improve Outcome: Progressing   Problem: Education: Goal: Verbalization of understanding the information provided will improve Outcome: Progressing

## 2017-06-30 NOTE — Plan of Care (Signed)
Patient pacing in the hallway most of the shift and talking to himself.Visible in the milieu with no interactions with peers and staff.Patient smiles and denies for SI,HI and AVH.Attended groups.Appetite and energy level good.Patient reluctant to talk to staff much.Support and encouragement given.

## 2017-06-30 NOTE — [facility] Group Notes (Signed)

## 2017-06-30 NOTE — Progress Notes (Signed)
Recreation Therapy Notes  Date: 06/30/2017  Time: 9:30 am   Location: Craft Room   Behavioral response: N/A   Intervention Topic: Leisure  Discussion/Intervention: Patient did not attend group.   Clinical Observations/Feedback:  Patient did not attend group.   Elfego Giammarino LRT/CTRS         Larin Weissberg 06/30/2017 10:27 AM

## 2017-06-30 NOTE — Progress Notes (Signed)
D:Pt denies SI/HI/AVH. Pt. Verbally is able to contract for safety. Pt. Reports he can remain safe while on the unit. No visible psychotic features observed this evening, but patient did appear to present with some thought-blocking occasionally. Pt. Attends snack time and comes up for his medications, but frequently isolative and withdrawn. Pt. Has not complaints this evening. Pt. Interaction appropriate, but forwards little.    A: Q x 15 minute observation checks were completed for safety. Patient was provided with education. Pt. Verbalizes understanding of provided education. Patient was given scheduled medications. Patient  was encourage to attend groups, participate in unit activities and continue with plan of care. Pt. Given support and encouragement. Pt. Chart and care plans reviewed.    R:Patient is complaint with medication and unit procedures. Pt. Does not attend groups. Pt. EKG not able to be complete this evening. Will update day shift during handoff report that EKG is still needed.             Precautionary checks every 15 minutes for safety maintained, room free of safety hazards, patient sustains no injury or falls during this shift.

## 2017-06-30 NOTE — Plan of Care (Signed)
  Problem: Coping: Goal: Ability to verbalize frustrations and anger appropriately will improve Outcome: Progressing   Problem: Safety: Goal: Periods of time without injury will increase Outcome: Progressing  Patient is safe on the unit no distress noted.

## 2017-06-30 NOTE — Progress Notes (Signed)
[facility] MD Progress Note  06/30/2017 12:29 PM Nathan Barnett.  MRN:  [id number] Subjective:  Pt is in his room sleeping this afternoon but readily got up to talk. He is less bizzare today and no delusional thought content came up. When asked what was on his mind, he stated to "come up with a compromise to go home." He states that that includes "finding an adequate medication and to attend sessions." (meaning follow up). He states that the Haldol "helps dull my mind." When discussed that the intention is not to dull feelings or mind, he states, "No I mean that in a good way because I"m a thinker." He states that he had a visit by his mother last night that went well. He states that he wants to go home and he thought he would only be here 5 days. Discussed with him that we just need a bit more time and he was receptive to this. He is eating well. He smelled himself under his shirt and I asked if he showered. He states, "Do you smell me or something?" (feeling self conscious about his odor and appearance-which he was not malodorous" He states taht he did shower yesterday. He has good hygiene and better eye contact. He denies SI or HI.  Principal Problem: Schizophrenia (HCC) Diagnosis:   Patient Active Problem List   Diagnosis Date Noted  . Schizophrenia (HCC) [F20.9] 06/26/2017    Priority: High  . 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 No pertinent past medical history. History reviewed. No pertinent surgical history. Family History: History reviewed. No pertinent family history. Family Psychiatric  History: See H&P Social History:  Social History   Substance and Sexual Activity  Alcohol Use Not on file     Social History   Substance and Sexual Activity  Drug Use Not on file    Social History   Socioeconomic History  . Marital status: Single    Spouse name: Not on file   . Number of children: Not on file  . Years of education: Not on file  . Highest education level: Not on file  Occupational History  . Not on file  Social Needs  . Financial resource strain: Not on file  . Food insecurity:    Worry: Not on file    Inability: Not on file  . Transportation needs:    Medical: Not on file    Non-medical: Not on file  Tobacco Use  . Smoking status: Current Some Day Smoker    Types: Cigarettes  . Smokeless tobacco: Never Used  Substance and Sexual Activity  . Alcohol use: Not on file  . Drug use: Not on file  . Sexual activity: Not on file  Lifestyle  . Physical activity:    Days per week: Not on file    Minutes per session: Not on file  . Stress: Not on file  Relationships  . Social connections:    Talks on phone: Not on file    Gets together: Not on file    Attends religious service: Not on file    Active member of club or organization: Not on file    Attends meetings of clubs or organizations: Not on file    Relationship status: Not on file  Other Topics Concern  . Not on file  Social History Narrative  . Not on file   Additional  Social History:                         Sleep: Good  Appetite:  Good  Current Medications: Current Facility-Administered Medications  Medication Dose Route Frequency Provider Last Rate Last Dose  . acetaminophen (TYLENOL) tablet 650 mg  650 mg Oral Q6H PRN Clapacs, John T, MD      . alum & mag hydroxide-simeth (MAALOX/MYLANTA) 200-200-20 MG/5ML suspension 30 mL  30 mL Oral Q4H PRN Clapacs, John T, MD      . haloperidol (HALDOL) tablet 10 mg  10 mg Oral QHS Hector Venne R, MD   10 mg at 06/29/17 2118  . hydrOXYzine (ATARAX/VISTARIL) tablet 25 mg  25 mg Oral TID PRN Audery Amel, MD   25 mg at 06/28/17 2116  . lithium carbonate (LITHOBID) CR tablet 300 mg  300 mg Oral QHS Rolondo Pierre, Ileene Hutchinson, MD   300 mg at 06/29/17 2118  . magnesium hydroxide (MILK OF MAGNESIA) suspension 30 mL  30 mL Oral Daily PRN  Clapacs, John T, MD      . temazepam (RESTORIL) capsule 7.5 mg  7.5 mg Oral QHS PRN Haskell Riling, MD   7.5 mg at 06/29/17 2118    Lab Results:  Results for orders placed or performed during the hospital encounter of 06/26/17 (from the past 48 hour(s))  CBC with Differential/Platelet     Status: Abnormal   Collection Time: 06/29/17  6:27 AM  Result Value Ref Range   WBC 2.9 (L) 3.8 - 10.6 K/uL   RBC 5.49 4.40 - 5.90 MIL/uL   Hemoglobin 13.3 13.0 - 18.0 g/dL   HCT 16.1 09.6 - 04.5 %   MCV 75.6 (L) 80.0 - 100.0 fL   MCH 24.3 (L) 26.0 - 34.0 pg   MCHC 32.1 32.0 - 36.0 g/dL   RDW 40.9 81.1 - 91.4 %   Platelets 233 150 - 440 K/uL   Neutrophils Relative % 29 %   Neutro Abs 0.9 (L) 1.4 - 6.5 K/uL   Lymphocytes Relative 58 %   Lymphs Abs 1.7 1.0 - 3.6 K/uL   Monocytes Relative 8 %   Monocytes Absolute 0.2 0.2 - 1.0 K/uL   Eosinophils Relative 4 %   Eosinophils Absolute 0.1 0 - 0.7 K/uL   Basophils Relative 1 %   Basophils Absolute 0.0 0 - 0.1 K/uL    Comment: Performed at [facility], [address] [place], [place] [address]    Blood Alcohol level:  Lab Results  Component Value Date   [facility] <10 06/24/2017    Metabolic Disorder Labs: Lab Results  Component Value Date   HGBA1C 4.6 (L) 06/27/2017   MPG 85.32 06/27/2017   No results found for: PROLACTIN Lab Results  Component Value Date   CHOL 157 06/27/2017   TRIG 89 06/27/2017   HDL 62 06/27/2017   CHOLHDL 2.5 06/27/2017   VLDL 18 06/27/2017   LDLCALC 77 06/27/2017    Physical Findings: AIMS: Facial and Oral Movements Muscles of Facial Expression: None, normal Lips and Perioral Area: None, normal Jaw: None, normal Tongue: None, normal,Extremity Movements Upper (arms, wrists, hands, fingers): None, normal Lower (legs, knees, ankles, toes): None, normal, Trunk Movements Neck, shoulders, hips: None, normal, Overall Severity Severity of abnormal movements (highest score from questions above): None,  normal Incapacitation due to abnormal movements: None, normal Patient's awareness of abnormal movements (rate only patient's report): No Awareness, Dental Status Current problems  with teeth and/or dentures?: Yes Does patient usually wear dentures?: No  CIWA:    COWS:     Musculoskeletal: Strength & Muscle Tone: Normal Gait & Station: normal Patient leans: N/A  Psychiatric Specialty Exam: Physical Exam  Nursing note and vitals reviewed.   Review of Systems  All other systems reviewed and are negative.   Blood pressure 111/80, pulse (!) 112, temperature (!) 97.5 F (36.4 C), temperature source Oral, resp. rate 18, height  (1.854 m), weight 70.3 kg (155 lb), SpO2 100 %.Body mass index is 20.45 kg/m.  General Appearance: Casual  Eye Contact:  Fair  Speech:  Clear and Coherent  Volume:  Normal  Mood:  Euthymic  Affect:  Constricted  Thought Process:  Coherent and Irrelevant at times  Orientation:  Fully oriented  Thought Content:  Illogical  Suicidal Thoughts:  No  Homicidal Thoughts:  No  Memory:  Immediate;   Fair  Judgement:  Impaired  Insight:  Lacking  Psychomotor Activity:  Normal  Concentration:  Concentration: Fair  Recall:  [employer] of Knowledge:  Fair  Language:  Fair  Akathisia:  No      Assets:  Resilience  ADL's:  Intact  Cognition:  WNL  Sleep:  Number of Hours: 7.15     Treatment Plan Summary: 27 yo male admitted due to bizzare behaviors. He is still observed to be responding to internal stimuli at times by RN staff. During my assessment, he is not laughing inappropriately or giggling like he was on admission. He still has poor insight but states that he will take medications and follow up when he leaves. He is less delusional today but does still make odd and nonsensical comments at times. No unsafe behaviors on the unit and has been caring for his ADLs.   Schizophrenia -Continue increased dose of Haldol 10 mg qhs. Will check CBC tomorrow to  monitor ANC -Continue Lithium 300 mg qhs  Neutropenia --Oncology signing off. Workup has been negative and ANC remains stable -He will follow up in one month  Dispo -He will discharge home when stable.     Haskell Riling, MD 06/30/2017, 12:29 PM

## 2017-06-30 NOTE — Plan of Care (Signed)
Pt. Verbalizes understanding of provided education. Pt. Compliant with medications this evening. Pt. Denies SI/HI. Pt. Verbally is able to contract for safety. Pt. Reports he can remain safe while on the unit. No visible psychotic features observed this evening. Pt. Attends snack time and comes up for his medications.    Problem: Education: Goal: Knowledge of [place] [facility] Education information/materials will improve Outcome: Progressing   Problem: Health Behavior/Discharge Planning: Goal: Compliance with treatment plan for underlying cause of condition will improve Outcome: Progressing   Problem: Safety: Goal: Periods of time without injury will increase Outcome: Progressing   Problem: Education: Goal: Will be free of psychotic symptoms Outcome: Progressing   Problem: Safety: Goal: Ability to remain free from injury will improve Outcome: Progressing

## 2017-07-01 LAB — CBC WITH DIFFERENTIAL/PLATELET
BASOS ABS: 0 10*3/uL (ref 0–0.1)
BASOS PCT: 1 %
Eosinophils Absolute: 0.1 10*3/uL (ref 0–0.7)
Eosinophils Relative: 3 %
HEMATOCRIT: 40.6 % (ref 40.0–52.0)
HEMOGLOBIN: 13.4 g/dL (ref 13.0–18.0)
Lymphocytes Relative: 50 %
Lymphs Abs: 1.7 10*3/uL (ref 1.0–3.6)
MCH: 24.8 pg — AB (ref 26.0–34.0)
MCHC: 33 g/dL (ref 32.0–36.0)
MCV: 75 fL — ABNORMAL LOW (ref 80.0–100.0)
Monocytes Absolute: 0.2 10*3/uL (ref 0.2–1.0)
Monocytes Relative: 7 %
NEUTROS ABS: 1.4 10*3/uL (ref 1.4–6.5)
NEUTROS PCT: 39 %
Platelets: 239 10*3/uL (ref 150–440)
RBC: 5.42 MIL/uL (ref 4.40–5.90)
RDW: 13.9 % (ref 11.5–14.5)
WBC: 3.5 10*3/uL — ABNORMAL LOW (ref 3.8–10.6)

## 2017-07-01 NOTE — Progress Notes (Signed)
D:Pt denies SI/HI/AVH. Pt. Verbally is able to contract for safety. Pt. Reports he can remain safe while on the unit. No visibly psychotic features observed this evening, but patient did appear to present with some thought-blocking occasionally. Pt. Observed smiling periodically to himself during conversation with this Clinical research associate and when he is by himself walking around the milieu, but no definitive observations of responding to internal stimuli this evening. Pt. Attends snack time and comes up for his medications, but frequently isolative and withdrawn. Pt. Has no complaints this evening. Pt. Interaction appropriate, but forwards little. Minimal engagement overall with this Clinical research associate. Pt. Reports eating and sleeping good.       A: Q x 15 minute observation checks were completed for safety. Patient was provided with education. Pt. Verbalizes understanding of provided education. Patient was given scheduled medications. Patient was encourage to attend groups, participate in unit activities and continue with plan of care. Pt. Given support and encouragement. Pt. Chart and care plans reviewed.    R:Patient is complaint with medication and unit procedures. Pt. Does attend groups.            Precautionary checks every 15 minutes for safety maintained, room free of safety hazards, patient sustains no injury or falls during this shift.

## 2017-07-01 NOTE — [facility] Group Notes (Signed)
LCSW Group Therapy Note  07/01/2017 1:15pm  Type of Therapy and Topic: Group Therapy: Holding on to Grudges   Participation Level: Did Not Attend   Description of Group:  In this group patients will be asked to explore and define a grudge. Patients will be guided to discuss their thoughts, feelings, and reasons as to why people have grudges. Patients will process the impact grudges have on daily life and identify thoughts and feelings related to holding grudges. Facilitator will challenge patients to identify ways to let go of grudges and the benefits this provides. Patients will be confronted to address why one struggles letting go of grudges. Lastly, patients will identify feelings and thoughts related to what life would look like without grudges. This group will be process-oriented, with patients participating in exploration of their own experiences, giving and receiving support, and processing challenge from other group members.  Therapeutic Goals:  1. Patient will identify specific grudges related to their personal life.  2. Patient will identify feelings, thoughts, and beliefs around grudges.  3. Patient will identify how one releases grudges appropriately.  4. Patient will identify situations where they could have let go of the grudge, but instead chose to hold on.   Summary of Patient Progress:   Therapeutic Modalities:  Cognitive Behavioral Therapy  Solution Focused Therapy  Motivational Interviewing  Brief Therapy   Normal Recinos  CUEBAS-COLON, LCSW 07/01/2017 12:52 PM

## 2017-07-01 NOTE — Plan of Care (Signed)
Patient alert and oriented x 3 with some periods of confusion noted. Patient denies SI/HI/AVH but seems to be responding to stimuli while pacing the unit. Does not interact with peers nor staff. When spoken to his speech is sometimes tangential and incoherent. Milieu remains  safe with q 15 minute safety checks.

## 2017-07-01 NOTE — Plan of Care (Signed)
Pt. Verbalizes understanding of provided education. Pt. Compliant with medications this evening and treatment. Pt. Denies SI/HI, verbally contracts for safety. Reports he can remain safe while on the unit. Pt. Observed smiling periodically to himself during conversation with this Clinical research associate and when he is by himself walking around the milieu, but no definitive observations of responding to internal stimuli this evening. Pt. Denies AVH.    Problem: Education: Goal: Will be free of psychotic symptoms Outcome: Not Progressing   Problem: Education: Goal: Knowledge of [place] [facility] Education information/materials will improve Outcome: Progressing   Problem: Health Behavior/Discharge Planning: Goal: Compliance with treatment plan for underlying cause of condition will improve Outcome: Progressing   Problem: Safety: Goal: Periods of time without injury will increase Outcome: Progressing   Problem: Safety: Goal: Ability to remain free from injury will improve Outcome: Progressing

## 2017-07-01 NOTE — Progress Notes (Signed)
[facility] MD Progress Note  07/01/2017 12:23 PM Nathan Barnett.  MRN:  [id number] Subjective:  'I am doing fine" Pt reportedly pacing on unit, guarded, responding to unseen. Pt denies AVH although he was noted talking to self at times, denies SI/HI. Pt taking meds, denies side effects.  Principal Problem: Schizophrenia (HCC) Diagnosis:   Patient Active Problem List   Diagnosis Date Noted  . 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 Schizophrenia (HCC) [F20.9] 06/26/2017   Total Time spent with patient: 30 minutes  Past Psychiatric History: no new info  Past Medical History: History reviewed. No pertinent past medical history. History reviewed. No pertinent surgical history. Family History: History reviewed. No pertinent family history. Family Psychiatric  History: no new info  Social History:  Social History   Substance and Sexual Activity  Alcohol Use Not on file     Social History   Substance and Sexual Activity  Drug Use Not on file    Social History   Socioeconomic History  . Marital status: Single    Spouse name: Not on file  . Number of children: Not on file  . Years of education: Not on file  . Highest education level: Not on file  Occupational History  . Not on file  Social Needs  . Financial resource strain: Not on file  . Food insecurity:    Worry: Not on file    Inability: Not on file  . Transportation needs:    Medical: Not on file    Non-medical: Not on file  Tobacco Use  . Smoking status: Current Some Day Smoker    Types: Cigarettes  . Smokeless tobacco: Never Used  Substance and Sexual Activity  . Alcohol use: Not on file  . Drug use: Not on file  . Sexual activity: Not on file  Lifestyle  . Physical activity:    Days per week: Not on file    Minutes per session: Not on file  . Stress: Not on file  Relationships  . Social connections:    Talks on phone: Not on file    Gets together: Not on file    Attends religious service: Not on file   Active member of club or organization: Not on file    Attends meetings of clubs or organizations: Not on file    Relationship status: Not on file  Other Topics Concern  . Not on file  Social History Narrative  . Not on file   Additional Social History:                         Sleep: Good  Appetite:  Fair  Current Medications: Current Facility-Administered Medications  Medication Dose Route Frequency Provider Last Rate Last Dose  . acetaminophen (TYLENOL) tablet 650 mg  650 mg Oral Q6H PRN Clapacs, John T, MD      . alum & mag hydroxide-simeth (MAALOX/MYLANTA) 200-200-20 MG/5ML suspension 30 mL  30 mL Oral Q4H PRN Clapacs, John T, MD      . haloperidol (HALDOL) tablet 10 mg  10 mg Oral QHS McNew, Ileene Hutchinson, MD   10 mg at 06/30/17 2104  . hydrOXYzine (ATARAX/VISTARIL) tablet 25 mg  25 mg Oral TID PRN Clapacs, Jackquline Denmark, MD   25 mg at 06/28/17 2116  . lithium carbonate (LITHOBID) CR tablet 300 mg  300 mg Oral QHS McNew, Ileene Hutchinson, MD   300 mg at 06/30/17 2104  . magnesium hydroxide (MILK  OF MAGNESIA) suspension 30 mL  30 mL Oral Daily PRN Clapacs, John T, MD      . temazepam (RESTORIL) capsule 7.5 mg  7.5 mg Oral QHS PRN Haskell Riling, MD   7.5 mg at 06/30/17 2106    Lab Results:  Results for orders placed or performed during the hospital encounter of 06/26/17 (from the past 48 hour(s))  CBC with Differential/Platelet     Status: Abnormal   Collection Time: 07/01/17  6:45 AM  Result Value Ref Range   WBC 3.5 (L) 3.8 - 10.6 K/uL   RBC 5.42 4.40 - 5.90 MIL/uL   Hemoglobin 13.4 13.0 - 18.0 g/dL   HCT 95.6 21.3 - 08.6 %   MCV 75.0 (L) 80.0 - 100.0 fL   MCH 24.8 (L) 26.0 - 34.0 pg   MCHC 33.0 32.0 - 36.0 g/dL   RDW 57.8 46.9 - 62.9 %   Platelets 239 150 - 440 K/uL   Neutrophils Relative % 39 %   Neutro Abs 1.4 1.4 - 6.5 K/uL   Lymphocytes Relative 50 %   Lymphs Abs 1.7 1.0 - 3.6 K/uL   Monocytes Relative 7 %   Monocytes Absolute 0.2 0.2 - 1.0 K/uL   Eosinophils Relative 3 %    Eosinophils Absolute 0.1 0 - 0.7 K/uL   Basophils Relative 1 %   Basophils Absolute 0.0 0 - 0.1 K/uL    Comment: Performed at [facility], [address] [place], [place] [address]    Blood Alcohol level:  Lab Results  Component Value Date   [facility] <10 06/24/2017    Metabolic Disorder Labs: Lab Results  Component Value Date   HGBA1C 4.6 (L) 06/27/2017   MPG 85.32 06/27/2017   No results found for: PROLACTIN Lab Results  Component Value Date   CHOL 157 06/27/2017   TRIG 89 06/27/2017   HDL 62 06/27/2017   CHOLHDL 2.5 06/27/2017   VLDL 18 06/27/2017   LDLCALC 77 06/27/2017    Physical Findings: AIMS: Facial and Oral Movements Muscles of Facial Expression: None, normal Lips and Perioral Area: None, normal Jaw: None, normal Tongue: None, normal,Extremity Movements Upper (arms, wrists, hands, fingers): None, normal Lower (legs, knees, ankles, toes): None, normal, Trunk Movements Neck, shoulders, hips: None, normal, Overall Severity Severity of abnormal movements (highest score from questions above): None, normal Incapacitation due to abnormal movements: None, normal Patient's awareness of abnormal movements (rate only patient's report): No Awareness, Dental Status Current problems with teeth and/or dentures?: No Does patient usually wear dentures?: No  CIWA:    COWS:     Musculoskeletal: Strength & Muscle Tone: within normal limits Gait & Station: normal Patient leans:   Psychiatric Specialty Exam: Physical Exam  Nursing note and vitals reviewed.   ROS  Blood pressure 123/83, pulse (!) 102, temperature 97.8 F (36.6 C), temperature source Oral, resp. rate 16, height  (1.854 m), weight 70.3 kg (155 lb), SpO2 98 %.Body mass index is 20.45 kg/m.  General Appearance: Casual, age appropriate  Eye Contact:  Fair  Speech:  Clear and Coherent  Volume:  Normal  Mood:  fine"  Affect:  restricted  Thought Process:  concrete  Orientation:  Fully  oriented  Thought Content:  Illogical,denies SI/HI  Suicidal Thoughts:  No  Homicidal Thoughts:  No  Memory:  Immediate;   Fair  Judgement:  Impaired  Insight:  Lacking  Psychomotor Activity:  Normal  Concentration:  Concentration: Fair  Recall:  [employer]  of Knowledge:  Fair  Language:  Fair  Akathisia:  denies      Assets:  Resilience  ADL's:  Intact  Cognition:  WNL  Sleep:  Number of Hours: 8       Treatment Plan Summary: Daily contact with patient to assess and evaluate symptoms and progress in treatment and Medication management . Pt psychotic, disorganized.  WBC-3.5, improving.  Cont haldol, lithium.   Beverly Sessions, MD 07/01/2017, 12:23 PM

## 2017-07-02 NOTE — Progress Notes (Signed)
D:Pt denies SI/HI/AVH. Pt. Reports he can remain safe while on the unit. Pt. Verbally contracts for safety. Pt. Does not attend groups and when attending snack time or is in the milieu the patient is isolative and does not talk to peers or staff. Pt. Continues to smile to himself periodically during conversations with this Clinical research associate and staff as well as is observed pacing the hallways talking to himself, smiling periodically, and with a mostly blunted/flat affect. Pt. Does not provide an answer when asked why he paces the hallways, he just says, "it's hard to explain". Pt. Upon interactions is pleasant and cooperative, but forwards little and presents often with thought-blocking behaviors. Overall behavior is appropriate with staff and peers. Pt. Also presents with some concrete thinking. When asked about his plans going forward or if he would like to talk with his family for support, is very persistent and says, "I can't focus on any of that right now, but they'll understand, they understand me, it's hard to explain, but yeah..I don't know how to answer that question".   A: Q x 15 minute observation checks were completed for safety. Patient was provided with education. Pt. Verbalizes understanding of provided education. Patient was given scheduled/prn medications. Patient  was encourage to attend groups, participate in unit activities and continue with plan of care. Pt. Given support and encouragement. Pt. chart and care plans reviewed.    R:Patient is complaint with medication and unit procedures.             Precautionary checks every 15 minutes for safety maintained, room free of safety hazards, patient sustains no injury or falls during this shift.

## 2017-07-02 NOTE — Plan of Care (Signed)
Patient verbalizes understanding of the general information that has been provided to him and has not voiced any further questions or concerns at this time. Patient denies SI/HI/AVH as well as any signs/symptoms of depression/anxiety at this time. Patient has been isolative to his room except for meal and snack times. Patient has been observed pacing that hallways throughout the day when he is not in his room. Patient has the ability to verbalize his anger and frustrations into appropriate behaviors as well as identify the available resources that can assist him in meeting his health-care needs, however he has not voiced them to this writer today. Patient has been in compliance with his treatment plan thus far and has been free from injury on the unit. Patient has participated in self care and has remained free from injury thus far. Patient remains safe on the unit at this time.  Problem: Education: Goal: Knowledge of [place] [facility] Education information/materials will improve Outcome: Progressing Goal: Emotional status will improve Outcome: Progressing Goal: Mental status will improve Outcome: Progressing Goal: Verbalization of understanding the information provided will improve Outcome: Progressing   Problem: Activity: Goal: Interest or engagement in activities will improve Outcome: Progressing   Problem: Coping: Goal: Ability to verbalize frustrations and anger appropriately will improve Outcome: Progressing   Problem: Health Behavior/Discharge Planning: Goal: Identification of resources available to assist in meeting health care needs will improve Outcome: Progressing Goal: Compliance with treatment plan for underlying cause of condition will improve Outcome: Progressing   Problem: Safety: Goal: Periods of time without injury will increase Outcome: Progressing   Problem: Education: Goal: Will be free of psychotic symptoms Outcome: Progressing   Problem: Self-Care: Goal:  Ability to participate in self-care as condition permits will improve Outcome: Progressing   Problem: Safety: Goal: Ability to remain free from injury will improve Outcome: Progressing

## 2017-07-02 NOTE — Progress Notes (Signed)
D- Patient alert and oriented. Patient presents in a pleasant mood with no complaints on assessment stating that overall he is feeling "fine". Patient denies SI, HI, AVH, and pain at this time. Patient also denies any signs/symptoms of depression and anxiety. Patient has no stated goal for today stating "naw" when this writer asked what his goals are for today.  A- Scheduled medications administered to patient, per MD orders. Support and encouragement provided.  Routine safety checks conducted every 15 minutes.  Patient informed to notify staff with problems or concerns.  R- No adverse drug reactions noted. Patient contracts for safety at this time. Patient compliant with medications and treatment plan. Patient receptive, calm, and cooperative. Patient interacts well with others on the unit.  Patient remains safe at this time.

## 2017-07-02 NOTE — [facility] Group Notes (Signed)
LCSW Group Therapy Note 07/02/2017 1:15pm  Type of Therapy and Topic: Group Therapy: Feelings Around Returning Home & Establishing a Supportive Framework and Supporting Oneself When Supports Not Available  Participation Level: Did Not Attend  Description of Group:  Patients first processed thoughts and feelings about upcoming discharge. These included fears of upcoming changes, lack of change, new living environments, judgements and expectations from others and overall stigma of mental health issues. The group then discussed the definition of a supportive framework, what that looks and feels like, and how do to discern it from an unhealthy non-supportive network. The group identified different types of supports as well as what to do when your family/friends are less than helpful or unavailable  Therapeutic Goals  1. Patient will identify one healthy supportive network that they can use at discharge. 2. Patient will identify one factor of a supportive framework and how to tell it from an unhealthy network. 3. Patient able to identify one coping skill to use when they do not have positive supports from others. 4. Patient will demonstrate ability to communicate their needs through discussion and/or role plays.  Summary of Patient Progress:    Therapeutic Modalities Cognitive Behavioral Therapy Motivational Interviewing   Nathan Barnett  CUEBAS-COLON, LCSW 07/02/2017 12:58 PM 

## 2017-07-02 NOTE — Plan of Care (Signed)
Pt. Verbalizes understanding of provided education. Pt. Compliant with medications and unit procedures. Pt. Reports he can remain safe while on the unit. Pt. Denies Si/HI. Pt. Verbally contracts for safety. Pt. Does not attend groups and when attending snack time or is in the milieu the patient is isolative and does not talk to peers or staff. Pt. Continues to smile to himself periodically during conversations with this Clinical research associate and staff as well as is observed pacing the hallways talking to himself, with a blunted/flat affect.    Problem: Education: Goal: Knowledge of [facility] Education information/materials will improve Outcome: Progressing   Problem: Health Behavior/Discharge Planning: Goal: Compliance with treatment plan for underlying cause of condition will improve Outcome: Progressing   Problem: Safety: Goal: Periods of time without injury will increase Outcome: Progressing   Problem: Safety: Goal: Ability to remain free from injury will improve Outcome: Progressing

## 2017-07-02 NOTE — Progress Notes (Signed)
[facility] MD Progress Note  07/02/2017 12:33 PM Nathan Barnett.  MRN:  [id number] Subjective:  'I am ready to leave" Pt  denies AVH , although smiling  to himself at times, denies SI/HI. Pt taking meds, denies side effects.  Principal Problem: Schizophrenia (HCC) Diagnosis:   Patient Active Problem List   Diagnosis Date Noted  . 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 Schizophrenia (HCC) [F20.9] 06/26/2017   Total Time spent with patient: 30 minutes  Past Psychiatric History: no new info  Past Medical History: History reviewed. No pertinent past medical history. History reviewed. No pertinent surgical history. Family History: History reviewed. No pertinent family history. Family Psychiatric  History: no new info  Social History:  Social History   Substance and Sexual Activity  Alcohol Use Not on file     Social History   Substance and Sexual Activity  Drug Use Not on file    Social History   Socioeconomic History  . Marital status: Single    Spouse name: Not on file  . Number of children: Not on file  . Years of education: Not on file  . Highest education level: Not on file  Occupational History  . Not on file  Social Needs  . Financial resource strain: Not on file  . Food insecurity:    Worry: Not on file    Inability: Not on file  . Transportation needs:    Medical: Not on file    Non-medical: Not on file  Tobacco Use  . Smoking status: Current Some Day Smoker    Types: Cigarettes  . Smokeless tobacco: Never Used  Substance and Sexual Activity  . Alcohol use: Not on file  . Drug use: Not on file  . Sexual activity: Not on file  Lifestyle  . Physical activity:    Days per week: Not on file    Minutes per session: Not on file  . Stress: Not on file  Relationships  . Social connections:    Talks on phone: Not on file    Gets together: Not on file    Attends religious service: Not on file    Active member of club or organization: Not on file    Attends  meetings of clubs or organizations: Not on file    Relationship status: Not on file  Other Topics Concern  . Not on file  Social History Narrative  . Not on file   Additional Social History:                         Sleep: Good  Appetite:  Fair  Current Medications: Current Facility-Administered Medications  Medication Dose Route Frequency Provider Last Rate Last Dose  . acetaminophen (TYLENOL) tablet 650 mg  650 mg Oral Q6H PRN Clapacs, John T, MD      . alum & mag hydroxide-simeth (MAALOX/MYLANTA) 200-200-20 MG/5ML suspension 30 mL  30 mL Oral Q4H PRN Clapacs, John T, MD      . haloperidol (HALDOL) tablet 10 mg  10 mg Oral QHS McNew, Holly R, MD   10 mg at 07/01/17 2107  . hydrOXYzine (ATARAX/VISTARIL) tablet 25 mg  25 mg Oral TID PRN Audery Amel, MD   25 mg at 06/28/17 2116  . lithium carbonate (LITHOBID) CR tablet 300 mg  300 mg Oral QHS McNew, Ileene Hutchinson, MD   300 mg at 07/01/17 2108  . magnesium hydroxide (MILK OF MAGNESIA) suspension 30 mL  30 mL Oral Daily PRN Clapacs, John T, MD      . temazepam (RESTORIL) capsule 7.5 mg  7.5 mg Oral QHS PRN Haskell Riling, MD   7.5 mg at 07/01/17 2109    Lab Results:  Results for orders placed or performed during the hospital encounter of 06/26/17 (from the past 48 hour(s))  CBC with Differential/Platelet     Status: Abnormal   Collection Time: 07/01/17  6:45 AM  Result Value Ref Range   WBC 3.5 (L) 3.8 - 10.6 K/uL   RBC 5.42 4.40 - 5.90 MIL/uL   Hemoglobin 13.4 13.0 - 18.0 g/dL   HCT 09.8 11.9 - 14.7 %   MCV 75.0 (L) 80.0 - 100.0 fL   MCH 24.8 (L) 26.0 - 34.0 pg   MCHC 33.0 32.0 - 36.0 g/dL   RDW 82.9 56.2 - 13.0 %   Platelets 239 150 - 440 K/uL   Neutrophils Relative % 39 %   Neutro Abs 1.4 1.4 - 6.5 K/uL   Lymphocytes Relative 50 %   Lymphs Abs 1.7 1.0 - 3.6 K/uL   Monocytes Relative 7 %   Monocytes Absolute 0.2 0.2 - 1.0 K/uL   Eosinophils Relative 3 %   Eosinophils Absolute 0.1 0 - 0.7 K/uL   Basophils Relative  1 %   Basophils Absolute 0.0 0 - 0.1 K/uL    Comment: Performed at [facility], [address] [place], [place] [address]    Blood Alcohol level:  Lab Results  Component Value Date   [facility] <10 06/24/2017    Metabolic Disorder Labs: Lab Results  Component Value Date   HGBA1C 4.6 (L) 06/27/2017   MPG 85.32 06/27/2017   No results found for: PROLACTIN Lab Results  Component Value Date   CHOL 157 06/27/2017   TRIG 89 06/27/2017   HDL 62 06/27/2017   CHOLHDL 2.5 06/27/2017   VLDL 18 06/27/2017   LDLCALC 77 06/27/2017    Physical Findings: AIMS: Facial and Oral Movements Muscles of Facial Expression: None, normal Lips and Perioral Area: None, normal Jaw: None, normal Tongue: None, normal,Extremity Movements Upper (arms, wrists, hands, fingers): None, normal Lower (legs, knees, ankles, toes): None, normal, Trunk Movements Neck, shoulders, hips: None, normal, Overall Severity Severity of abnormal movements (highest score from questions above): None, normal Incapacitation due to abnormal movements: None, normal Patient's awareness of abnormal movements (rate only patient's report): No Awareness, Dental Status Current problems with teeth and/or dentures?: No Does patient usually wear dentures?: No  CIWA:    COWS:     Musculoskeletal: Strength & Muscle Tone: within normal limits Gait & Station: normal Patient leans:   Psychiatric Specialty Exam: Physical Exam  Nursing note and vitals reviewed.   ROS  Blood pressure 126/84, pulse 88, temperature 97.7 F (36.5 C), temperature source Oral, resp. rate 18, height  (1.854 m), weight 70.3 kg (155 lb), SpO2 100 %.Body mass index is 20.45 kg/m.  General Appearance: Casual, age appropriate  Eye Contact:  Fair  Speech:  Clear and Coherent  Volume:  Normal  Mood:  fine"  Affect:  restricted  Thought Process:  concrete  Orientation:  Fully oriented  Thought Content:  denies SI/HI, smiling  to himself at  times   Suicidal Thoughts:  No  Homicidal Thoughts:  No  Memory:  Immediate;   Fair  Judgement:  Impaired  Insight:  Lacking  Psychomotor Activity:  Normal  Concentration:  Concentration: Fair  Recall:  [employer]  of Knowledge:  Fair  Language:  Fair  Akathisia:  denies      Assets:  Resilience  ADL's:  Intact  Cognition:  WNL  Sleep:  Number of Hours: 8       Treatment Plan Summary: Daily contact with patient to assess and evaluate symptoms and progress in treatment and Medication management . Psychosis improving.  WBC-3.5, improving.  Cont haldol, lithium.   Beverly Sessions, MD 07/02/2017, 12:33 PMPatient ID: Nathan Shears., male   DOB: [birth year], 27 y.o.   MRN: [id number]

## 2017-07-03 MED ORDER — HALOPERIDOL DECANOATE 100 MG/ML IM SOLN
100.0000 mg | Freq: Once | INTRAMUSCULAR | Status: AC
Start: 2017-07-03 — End: 2017-07-03
  Administered 2017-07-03: 100 mg via INTRAMUSCULAR
  Filled 2017-07-03: qty 1

## 2017-07-03 MED ORDER — LITHIUM CARBONATE ER 300 MG PO TBCR: 300.0000 mg | EXTENDED_RELEASE_TABLET | Freq: Every day | ORAL | 0 refills | Status: DC

## 2017-07-03 MED ORDER — HALOPERIDOL 10 MG PO TABS: 10.0000 mg | ORAL_TABLET | Freq: Every day | ORAL | 0 refills | Status: DC

## 2017-07-03 MED ORDER — HALOPERIDOL DECANOATE 100 MG/ML IM SOLN: 100.0000 mg | INTRAMUSCULAR | Status: DC

## 2017-07-03 NOTE — Plan of Care (Signed)
Patient is pleasant and cooperative.  Smiles periodically.  Denies SI/HI/AVH.  Forwards little information.  Isolates to self.  Paces halls.  Up for meals.  No interaction with peers.  No group attendance.  Smiles inappropriately at times.  Support offered.  Remains safe on the unit.   Problem: Education: Goal: Knowledge of [facility] Education information/materials will improve Outcome: Progressing Goal: Emotional status will improve Outcome: Progressing Goal: Mental status will improve Outcome: Progressing Goal: Verbalization of understanding the information provided will improve Outcome: Progressing   Problem: Activity: Goal: Interest or engagement in activities will improve Outcome: Progressing   Problem: Coping: Goal: Ability to verbalize frustrations and anger appropriately will improve Outcome: Progressing   Problem: Health Behavior/Discharge Planning: Goal: Identification of resources available to assist in meeting health care needs will improve Outcome: Progressing Goal: Compliance with treatment plan for underlying cause of condition will improve Outcome: Progressing   Problem: Safety: Goal: Periods of time without injury will increase Outcome: Progressing   Problem: Education: Goal: Will be free of psychotic symptoms Outcome: Progressing   Problem: Self-Care: Goal: Ability to participate in self-care as condition permits will improve Outcome: Progressing   Problem: Safety: Goal: Ability to remain free from injury will improve Outcome: Progressing

## 2017-07-03 NOTE — Progress Notes (Signed)
Recreation Therapy Notes  Date: 07/03/2017  Time: 9:30 am   Location: Craft Room   Behavioral response: N/A   Intervention Topic: Problem Solving  Discussion/Intervention: Patient did not attend group.   Clinical Observations/Feedback:  Patient did not attend group.   Jackye Dever LRT/CTRS         Jovin Fester 07/03/2017 10:23 AM

## 2017-07-03 NOTE — Tx Team (Signed)
Interdisciplinary Treatment and Diagnostic Plan Update  07/03/2017 Time of Session: 11am *[name] MRN: [id number]  Principal Diagnosis: Schizophrenia [facility])  Secondary Diagnoses: Principal Problem:   Schizophrenia (HCC) Active Problems:   Other neutropenia (HCC)   Current Medications:  Current Facility-Administered Medications  Medication Dose Route Frequency Provider Last Rate Last Dose  . acetaminophen (TYLENOL) tablet 650 mg  650 mg Oral Q6H PRN Clapacs, John T, MD      . alum & mag hydroxide-simeth (MAALOX/MYLANTA) 200-200-20 MG/5ML suspension 30 mL  30 mL Oral Q4H PRN Clapacs, John T, MD      . haloperidol (HALDOL) tablet 10 mg  10 mg Oral QHS McNew, Holly R, MD   10 mg at 07/02/17 2106  . hydrOXYzine (ATARAX/VISTARIL) tablet 25 mg  25 mg Oral TID PRN Clapacs, Jackquline Denmark, MD   25 mg at 06/28/17 2116  . lithium carbonate (LITHOBID) CR tablet 300 mg  300 mg Oral QHS McNew, Ileene Hutchinson, MD   300 mg at 07/02/17 2107  . magnesium hydroxide (MILK OF MAGNESIA) suspension 30 mL  30 mL Oral Daily PRN Clapacs, John T, MD      . temazepam (RESTORIL) capsule 7.5 mg  7.5 mg Oral QHS PRN McNew, Ileene Hutchinson, MD   7.5 mg at 07/02/17 2107   PTA Medications: No medications prior to admission.    Patient Stressors: Marital or family conflict Medication change or noncompliance  Patient Strengths: Active sense of humor Supportive family/friends  Treatment Modalities: Medication Management, Group therapy, Case management,  1 to 1 session with clinician, Psychoeducation, Recreational therapy.   Physician Treatment Plan for Primary Diagnosis: Schizophrenia (HCC) Long Term Goal(s): Improvement in symptoms so as ready for discharge   Short Term Goals: Ability to demonstrate self-control will improve  Medication Management: Evaluate patient's response, side effects, and tolerance of medication regimen.  Therapeutic Interventions: 1 to 1 sessions, Unit Group sessions and Medication  administration.  Evaluation of Outcomes: Progressing  Physician Treatment Plan for Secondary Diagnosis: Principal Problem:   Schizophrenia (HCC) Active Problems:   Other neutropenia (HCC)  Long Term Goal(s): Improvement in symptoms so as ready for discharge   Short Term Goals: Ability to demonstrate self-control will improve     Medication Management: Evaluate patient's response, side effects, and tolerance of medication regimen.  Therapeutic Interventions: 1 to 1 sessions, Unit Group sessions and Medication administration.  Evaluation of Outcomes: Progressing   RN Treatment Plan for Primary Diagnosis: Schizophrenia (HCC) Long Term Goal(s): Knowledge of disease and therapeutic regimen to maintain health will improve  Short Term Goals: Ability to verbalize feelings will improve, Ability to identify and develop effective coping behaviors will improve and Compliance with prescribed medications will improve  Medication Management: RN will administer medications as ordered by provider, will assess and evaluate patient's response and provide education to patient for prescribed medication. RN will report any adverse and/or side effects to prescribing provider.  Therapeutic Interventions: 1 on 1 counseling sessions, Psychoeducation, Medication administration, Evaluate responses to treatment, Monitor vital signs and CBGs as ordered, Perform/monitor CIWA, COWS, AIMS and Fall Risk screenings as ordered, Perform wound care treatments as ordered.  Evaluation of Outcomes: Progressing   LCSW Treatment Plan for Primary Diagnosis: Schizophrenia (HCC) Long Term Goal(s): Safe transition to appropriate next level of care at discharge, Engage patient in therapeutic group addressing interpersonal concerns.  Short Term Goals: Engage patient in aftercare planning with referrals and resources, Increase social support, Facilitate acceptance of mental health diagnosis and concerns,  Facilitate patient  progression through stages of change regarding substance use diagnoses and concerns, Identify triggers associated with mental health/substance abuse issues and Increase skills for wellness and recovery  Therapeutic Interventions: Assess for all discharge needs, 1 to 1 time with Social worker, Explore available resources and support systems, Assess for adequacy in community support network, Educate family and significant other(s) on suicide prevention, Complete Psychosocial Assessment, Interpersonal group therapy.  Evaluation of Outcomes: Progressing   Progress in Treatment: Attending groups: No. Participating in groups: No. Taking medication as prescribed: Yes. Toleration medication: Yes. Family/Significant other contact made: No, will contact:  Patient refused Patient understands diagnosis: No. Discussing patient identified problems/goals with staff: Yes. Medical problems stabilized or resolved: Yes. Denies suicidal/homicidal ideation: Yes. Issues/concerns per patient self-inventory: No. Other:   New problem(s) identified: No, Describe:  None  New Short Term/Long Term Goal(s): "To work on my relaxation and to continue to the same process when I leave."  Discharge Plan or Barriers: To return back home with family and follow up with outpatient with [facility].   Reason for Continuation of Hospitalization: Medication stabilization  Estimated Length of Stay: 1 day  Recreational Therapy: Patient Stressors: N/A Patient Goal: Patient will engage in groups without prompting or encouragement from LRT x3 group sessions within 5 recreation therapy group sessions  Attendees: Patient:  07/03/2017 10:57 AM  Physician: Corinna Gab, MD 07/03/2017 10:57 AM  Nursing: Leonia Reader RN 07/03/2017 10:57 AM  RN Care Manager:  07/03/2017 10:57 AM  Social Worker: [name], LCSWA 07/03/2017 10:57 AM  Recreational Therapist: Danella Deis. Dreama Saa, LRT 07/03/2017 10:57 AM  Other: Heidi Dach, LCSW 07/03/2017  10:57 AM  Other: Jake Shark LCSW 07/03/2017 10:57 AM  Other: 07/03/2017 10:57 AM    Scribe for Treatment Team: [name], LCSW 07/03/2017 10:57 AM

## 2017-07-03 NOTE — [facility] Group Notes (Signed)
[facility] Group Notes:  (Nursing/MHT/Case Management/Adjunct)  Date:  07/03/2017  Time:  9:05 PM  Type of Therapy:  Group Therapy  Participation Level:  Did Not Attend  Summary of Progress/Problems:  Mayra Neer 07/03/2017, 9:05 PM

## 2017-07-03 NOTE — Progress Notes (Signed)
[facility] MD Progress Note  07/03/2017 2:34 PM Nathan Barnett [name]  MRN:  [id number] Subjective:  Pt has been calm and cooperative on the unit. He mostly stays to himself. He does come out for meals and eating and taking care of himself. He is very brief with interactions. HE smiles to himself during interview and does appear to be responding to some internal stimuli but overall able to answer questions appropriately. HE states that he really is ready to go home. He states that he needs to get his car fixed and that is his plan. He agrees to continue the Haldol and follow up with [facility]. WE discussed possibly giving him LAI with Haldol. He asked what it does and what it is for. His questions were answered and he agreed to get the injection before discharging. He states that it does help him sleep. He has been sleeping. On Friday, he was observed by this provider, going outside and playing basketball at one time. He denies SI or HI. Denies AH, VH.  I spoke with his mother, [name] again. She states that she visited him over the weekend. She feels he is doing better and "He's able to listen to me much better." She would like him to be discharged soon so "he will be willing to come again if he needs help." We discussed the LAI and she is happy to hear he will be on this. Also discussed follow up with [facility] and oncology clinic for CBC monitoring. She expressed agreement with this.   Principal Problem: Schizophrenia (HCC) Diagnosis:   Patient Active Problem List   Diagnosis Date Noted  . Schizophrenia (HCC) [F20.9] 06/26/2017    Priority: High  . 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 No pertinent past medical history. History reviewed. No pertinent surgical history. Family History: History reviewed. No pertinent family history. Family Psychiatric  History: See H&P Social History:  Social  History   Substance and Sexual Activity  Alcohol Use Not on file     Social History   Substance and Sexual Activity  Drug Use Not on file    Social History   Socioeconomic History  . Marital status: Single    Spouse name: Not on file  . Number of children: Not on file  . Years of education: Not on file  . Highest education level: Not on file  Occupational History  . Not on file  Social Needs  . Financial resource strain: Not on file  . Food insecurity:    Worry: Not on file    Inability: Not on file  . Transportation needs:    Medical: Not on file    Non-medical: Not on file  Tobacco Use  . Smoking status: Current Some Day Smoker    Types: Cigarettes  . Smokeless tobacco: Never Used  Substance and Sexual Activity  . Alcohol use: Not on file  . Drug use: Not on file  . Sexual activity: Not on file  Lifestyle  . Physical activity:    Days per week: Not on file    Minutes per session: Not on file  . Stress: Not on file  Relationships  . Social connections:    Talks on phone: Not on file    Gets together: Not on file    Attends religious service: Not on file    Active member of club or organization: Not  on file    Attends meetings of clubs or organizations: Not on file    Relationship status: Not on file  Other Topics Concern  . Not on file  Social History Narrative  . Not on file   Additional Social History:                         Sleep: Good  Appetite:  Good  Current Medications: Current Facility-Administered Medications  Medication Dose Route Frequency Provider Last Rate Last Dose  . acetaminophen (TYLENOL) tablet 650 mg  650 mg Oral Q6H PRN Clapacs, John T, MD      . alum & mag hydroxide-simeth (MAALOX/MYLANTA) 200-200-20 MG/5ML suspension 30 mL  30 mL Oral Q4H PRN Clapacs, John T, MD      . haloperidol (HALDOL) tablet 10 mg  10 mg Oral QHS Tersa Fotopoulos R, MD   10 mg at 07/02/17 2106  . haloperidol decanoate (HALDOL DECANOATE) 100 MG/ML  injection 100 mg  100 mg Intramuscular Once Abril Cappiello, Jeanice Lim R, MD      . hydrOXYzine (ATARAX/VISTARIL) tablet 25 mg  25 mg Oral TID PRN Clapacs, Jackquline Denmark, MD   25 mg at 06/28/17 2116  . lithium carbonate (LITHOBID) CR tablet 300 mg  300 mg Oral QHS Rawley Harju, Ileene Hutchinson, MD   300 mg at 07/02/17 2107  . magnesium hydroxide (MILK OF MAGNESIA) suspension 30 mL  30 mL Oral Daily PRN Clapacs, John T, MD      . temazepam (RESTORIL) capsule 7.5 mg  7.5 mg Oral QHS PRN Blaise Grieshaber, Ileene Hutchinson, MD   7.5 mg at 07/02/17 2107    Lab Results: No results found for this or any previous visit (from the past 48 hour(s)).  Blood Alcohol level:  Lab Results  Component Value Date   ETH <10 06/24/2017    Metabolic Disorder Labs: Lab Results  Component Value Date   HGBA1C 4.6 (L) 06/27/2017   MPG 85.32 06/27/2017   No results found for: PROLACTIN Lab Results  Component Value Date   CHOL 157 06/27/2017   TRIG 89 06/27/2017   HDL 62 06/27/2017   CHOLHDL 2.5 06/27/2017   VLDL 18 06/27/2017   LDLCALC 77 06/27/2017    Physical Findings: AIMS: Facial and Oral Movements Muscles of Facial Expression: None, normal Lips and Perioral Area: None, normal Jaw: None, normal Tongue: None, normal,Extremity Movements Upper (arms, wrists, hands, fingers): None, normal Lower (legs, knees, ankles, toes): None, normal, Trunk Movements Neck, shoulders, hips: None, normal, Overall Severity Severity of abnormal movements (highest score from questions above): None, normal Incapacitation due to abnormal movements: None, normal Patient's awareness of abnormal movements (rate only patient's report): No Awareness, Dental Status Current problems with teeth and/or dentures?: No Does patient usually wear dentures?: No  CIWA:    COWS:     Musculoskeletal: Strength & Muscle Tone: within normal limits Gait & Station: normal Patient leans: N/A  Psychiatric Specialty Exam: Physical Exam  ROS  Blood pressure 119/74, pulse 100, temperature  98 F (36.7 C), temperature source Oral, resp. rate 17, height  (1.854 m), weight 70.3 kg (155 lb), SpO2 98 %.Body mass index is 20.45 kg/m.  General Appearance: Casual  Eye Contact:  Minimal  Speech:  Clear and Coherent  Volume:  Normal  Mood:  Euthymic  Affect:  Constricted, laughing to himself at times  Thought Process:  Coherent, brief, no delay in responses  Orientation:  Full (Time, Place, and Person)  Thought Content:  Illogical at times  Suicidal Thoughts:  No  Homicidal Thoughts:  No  Memory:  Immediate;   Fair  Judgement:  Fair  Insight:  Lacking  Psychomotor Activity:  Normal  Concentration:  Concentration: Fair  Recall:  [employer] of Knowledge:  Fair  Language:  Fair  Akathisia:  No      Assets:  Resilience  ADL's:  Intact  Cognition:  WNL  Sleep:  Number of Hours: 10     Treatment Plan Summary: 27 yo male admitted due to 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 He has not displayed any dangerous behaviors on the unit. HE responds to internal stimuli at times but able to redirect. He has been caring for his ADLs. He denies SI or HI. He is showering and caring for himself. HE is eating meals and taking medications. He has poor insight into his diagnosis and need for medications  Plan:  Schizophrenia -Continue oral Haldol 10 mg qhs -He is willing to get Haldol decanoate injection to help with medication compliance. Will order Haldol dec 100 mg today  Neutropenia -Continue Lithium 300 mg qhs. This will also help with mood stabilization -CBC over weekend much better -He will follow up in Heme/Onc clinic in June  Dispo -He will likely discharge tomorrow with his family and follow up with Alessandra Bevels, MD 07/03/2017, 2:34 PM

## 2017-07-04 NOTE — Progress Notes (Signed)
Recreation Therapy Notes  INPATIENT RECREATION TR PLAN  Patient Details Name: Nathan Barnett. MRN: [id number] DOB: [birth year] Today's Date: 07/04/2017  Rec Therapy Plan Is patient appropriate for Therapeutic Recreation?: Yes Treatment times per week: at least 3 Estimated Length of Stay: 5-7 days TR Treatment/Interventions: Group participation (Comment)  Discharge Criteria Pt will be discharged from therapy if:: Discharged Treatment plan/goals/alternatives discussed and agreed upon by:: Patient/family  Discharge Summary Short term goals set:  Patient will engage in groups without prompting or encouragement from LRT x3 group sessions within 5 recreation therapy group sessions Short term goals met: Not met Progress toward goals comments: (None) Reason goals not met: Patient spent most of his time in his room Therapeutic equipment acquired: N/A Reason patient discharged from therapy: Discharge from hospital Pt/family agrees with progress & goals achieved: Yes Date patient discharged from therapy: 07/04/17   Darnita Woodrum 07/04/2017, 3:29 PM

## 2017-07-04 NOTE — Discharge Summary (Signed)
Physician Discharge Summary Note  Patient:  Nathan Barnett. is an 27 y.o., male MRN:  [id number] DOB:  Sep 03, 1990 Patient phone:  [phone] (home)  Patient address:   [address] [place] [address],  Total Time spent with patient: 15 minutes  Plus 20 minutes of medication reconciliation, discharge planning, and discharge documentation   Date of Admission:  06/26/2017 Date of Discharge: 07/04/17  Reason for Admission:  Bizzare behaviors  Principal Problem: Schizophrenia [facility]) Discharge Diagnoses: Patient Active Problem List   Diagnosis Date Noted  . Schizophrenia ([place]) [F20.9] 06/26/2017    Priority: High  . Other neutropenia ([place]) [D70.8]     Past Psychiatric History: See H&P  Past Medical History: History reviewed. No pertinent past medical history. History reviewed. No pertinent surgical history. Family History: History reviewed. No pertinent family history. Family Psychiatric  History: See H&P Social History:  Social History   Substance and Sexual Activity  Alcohol Use Not on file     Social History   Substance and Sexual Activity  Drug Use Not on file    Social History   Socioeconomic History  . Marital status: Single    Spouse name: Not on file  . Number of children: Not on file  . Years of education: Not on file  . Highest education level: Not on file  Occupational History  . Not on file  Social Needs  . Financial resource strain: Not on file  . Food insecurity:    Worry: Not on file    Inability: Not on file  . Transportation needs:    Medical: Not on file    Non-medical: Not on file  Tobacco Use  . Smoking status: Current Some Day Smoker    Types: Cigarettes  . Smokeless tobacco: Never Used  Substance and Sexual Activity  . Alcohol use: Not on file  . Drug use: Not on file  . Sexual activity: Not on file  Lifestyle  . Physical activity:    Days per week: Not on file    Minutes per session: Not on file  . Stress: Not on file   Relationships  . Social connections:    Talks on phone: Not on file    Gets together: Not on file    Attends religious service: Not on file    Active member of club or organization: Not on file    Attends meetings of clubs or organizations: Not on file    Relationship status: Not on file  Other Topics Concern  . Not on file  Social History Narrative  . Not on file    Hospital Course:  Pt was found to have neutropenia. Heme/Onc was consulted and did workup including ultrasound of spleen and everything was normal. He was started on haldol for psychosis as this has lower incidence of worsening neutropenia than some other anti-psychotics. He was also started on low dose Lithium to help increase ANC and also for mood stabilization. WBC and ANC did improve to 3.5 and 1.4, respectively. He did agree to getting Haldol decanoate. He had very poor insight into need for medications and his diagnosis but willingly took medications.    He was very calm and pleasant on the unit. He mostly isolated to himself. He smiled to himself at times but did not show any unsafe behaviors. I was in contact with his mother through hospitalization. On day of discharge, he stated that he wanted to go home. He made some odd comments at times  but overall no unsafe behaviors. He was able to answer most questions appropriately. He did not have any delusional thought content. He did smile to himself at times. He is eating and taking care of his ADLs. Hygiene is good. Eye Contact is better. He expressed understanding of the medications and did not have future questions about them. We discussed that he needs to follow up to monitor CBC. I also spoke with his mother about this. When asked, pt denied SI< HI, AH, VH> He did not appear to be a danger to himself or others and was adequately caring for himself. He no longer met IVC criteria. His mother felt comfortable with him discharging home.   The patient is at low risk of imminent  suicide. Patient denied thoughts, intent, or plan for harm to self or others, expressed significant future orientation, and expressed an ability to mobilize assistance for his needs. he is presently void of any contributing psychiatric symptoms, cognitive difficulties, or substance use which would elevate his risk for lethality. Chronic risk for lethality is elevated in light of male gender, psychosis, poor insight.  The chronic risk is presently mitigated by his ongoing desire and engagement in [facility] treatment and mobilization of support from family and friends. Chronic risk may elevate if he experiences any significant loss or worsening of symptoms, which can be managed and monitored through outpatient providers. At this time,a cute risk for lethality is low and he is stable for ongoing outpatient management.   Modifiable risk factors were addressed during this hospitalization through appropriate pharmacotherapy and establishment of outpatient follow-up treatment. Some risk factors for suicide are situational (i.e. Unstable housing) or related personality pathology (i.e. Poor coping mechanisms) and thus cannot be further mitigated by continued hospitalization in this setting.   CBC  CBC Latest Ref Rng & Units 07/01/2017 06/29/2017 06/28/2017  WBC 3.8 - 10.6 K/uL 3.5(L) 2.9(L) 2.7(L)  Hemoglobin 13.0 - 18.0 g/dL 13.4 13.3 13.4  Hematocrit 40.0 - 52.0 % 40.6 41.5 41.7  Platelets 150 - 440 K/uL 239 233 236   Physical Findings: AIMS: Facial and Oral Movements Muscles of Facial Expression: None, normal Lips and Perioral Area: None, normal Jaw: None, normal Tongue: None, normal,Extremity Movements Upper (arms, wrists, hands, fingers): None, normal Lower (legs, knees, ankles, toes): None, normal, Trunk Movements Neck, shoulders, hips: None, normal, Overall Severity Severity of abnormal movements (highest score from questions above): None, normal Incapacitation due to abnormal movements: None,  normal Patient's awareness of abnormal movements (rate only patient's report): No Awareness, Dental Status Current problems with teeth and/or dentures?: No Does patient usually wear dentures?: No  CIWA:    COWS:     Musculoskeletal: Strength & Muscle Tone: within normal limits Gait & Station: normal Patient leans: N/A  Psychiatric Specialty Exam: Physical Exam  ROS  Blood pressure 116/88, pulse (!) 104, temperature 97.8 F (36.6 C), temperature source Oral, resp. rate 16, height _0  (1.854 m), weight 70.3 kg (155 lb), SpO2 99 %.Body mass index is 20.45 kg/m.  General Appearance: Casual  Eye Contact:  Fair  Speech:  Clear and Coherent  Volume:  Normal  Mood:  Euthymic  Affect:  Appropriate  Thought Process:  Coherent, odd at times  Orientation:  Full (Time, Place, and Person)  Thought Content:  Logical  Suicidal Thoughts:  No  Homicidal Thoughts:  No  Memory:  Immediate;   Fair  Judgement:  Fair  Insight:  Lacking  Psychomotor Activity:  Normal  Concentration:  Concentration: Fair  Recall:  Smiley Houseman of Knowledge:  Fair  Language:  Fair  Akathisia:  No      Assets:  Resilience  ADL's:  Intact  Cognition:  WNL  Sleep:  Number of Hours: 7.15     Have you used any form of tobacco in the last 30 days? (Cigarettes, Smokeless Tobacco, Cigars, and/or Pipes): Yes  Has this patient used any form of tobacco in the last 30 days? (Cigarettes, Smokeless Tobacco, Cigars, and/or Pipes) Yes, Pt refused   Blood Alcohol level:  Lab Results  Component Value Date   ETH <10 97/41/6384    Metabolic Disorder Labs:  Lab Results  Component Value Date   HGBA1C 4.6 (L) 06/27/2017   MPG 85.32 06/27/2017   No results found for: PROLACTIN Lab Results  Component Value Date   CHOL 157 06/27/2017   TRIG 89 06/27/2017   HDL 62 06/27/2017   CHOLHDL 2.5 06/27/2017   VLDL 18 06/27/2017   LDLCALC 77 06/27/2017    See Psychiatric Specialty Exam and Suicide Risk Assessment  completed by Attending Physician prior to discharge.  Discharge destination:  Home  Is patient on multiple antipsychotic therapies at discharge:  No   Has Patient had three or more failed trials of antipsychotic monotherapy by history:  No  Recommended Plan for Multiple Antipsychotic Therapies: NA  Discharge Instructions    Increase activity slowly   Complete by:  As directed      Allergies as of 07/04/2017   No Known Allergies     Medication List    TAKE these medications     Indication  haloperidol 10 MG tablet Commonly known as:  HALDOL Take 1 tablet (10 mg total) by mouth at bedtime. Notes to patient:  You will take this oral medication of Haldol for 4 weeks and then discontinue. It will take this long for the injection of Haldol to be theraputic  Indication:  Schizophrenia   haloperidol decanoate 100 MG/ML injection Commonly known as:  HALDOL DECANOATE Inject 1 mL (100 mg total) into the muscle every 28 (twenty-eight) days. Start taking on:  07/31/2017  Indication:  Schizophrenia   lithium carbonate 300 MG CR tablet Commonly known as:  LITHOBID Take 1 tablet (300 mg total) by mouth at bedtime. Notes to patient:  This medication is to help increase your white blood cell count  Indication:  Disorder with a Low Number of Neutrophils      Follow-up Information    [facility] [employer]. Go on 07/06/2017.   Why:  Please follow up with [facility] on Thursday Jul 06, 2017 at 9am. Thank you. Contact information: [place] [address] [phone]        [facility]. Go on 08/02/2017.   Why:  Please follow up with Dr.  Rogue Bussing on Wedbesday June 5th, 2019 at 11:30am to follow up for Oncology. Thank you.          Follow-up recommendations:  Follow up with [facility] and oncology clinic  Signed: Marylin Crosby, MD 07/04/2017, 9:24 AM

## 2017-07-04 NOTE — Plan of Care (Signed)
Pt calm and cooperative. Pt denies SI/HI. Pt not very talkative, but just inquired about having to take the haldol again when he already had the shot today and I explained to him it was still okay for him to have the low dose 10 mg tonight. Pt continues to pace the halls periodically. No other complaints at this time. Will continue to monitor.

## 2017-07-04 NOTE — [facility] Group Notes (Signed)
CSW Group Therapy Note  07/04/2017  Time:  0900  Type of Therapy and Topic: Group Therapy: Goals Group: SMART Goals    Participation Level:  Did Not Attend    Description of Group:   The purpose of a daily goals group is to assist and guide patients in setting recovery/wellness-related goals. The objective is to set goals as they relate to the crisis in which they were admitted. Patients will be using SMART goal modalities to set measurable goals. Characteristics of realistic goals will be discussed and patients will be assisted in setting and processing how one will reach their goal. Facilitator will also assist patients in applying interventions and coping skills learned in psycho-education groups to the SMART goal and process how one will achieve defined goal.    Therapeutic Goals:  -Patients will develop and document one goal related to or their crisis in which brought them into treatment.  -Patients will be guided by LCSW using SMART goal setting modality in how to set a measurable, attainable, realistic and time sensitive goal.  -Patients will process barriers in reaching goal.  -Patients will process interventions in how to overcome and successful in reaching goal.    Patient's Goal:  Pt was invited to attend group but chose not to attend. CSW will continue to encourage pt to attend group throughout their admission.   Therapeutic Modalities:  Motivational Interviewing  Cognitive Behavioral Therapy  Crisis Intervention Model  SMART goals setting  Heidi Dach, MSW, LCSW Clinical Social Worker 07/04/2017 10:02 AM

## 2017-07-04 NOTE — Progress Notes (Signed)
Recreation Therapy Notes          Bentleigh Waren 07/04/2017 12:10 PM

## 2017-07-04 NOTE — Progress Notes (Signed)
  [facility] Adult Case Management Discharge Plan :  Will you be returning to the same living situation after discharge:  Yes,  Home with mother At discharge, do you have transportation home?: Yes,  Mother coming at discharge Do you have the ability to pay for your medications: Yes,  Referred to a provider who can assist  Release of information consent forms completed and in the chart;  Patient's signature needed at discharge.  Patient to Follow up at: Follow-up Information    [facility], [employer]. Go on 07/06/2017.   Why:  Please follow up with [facility] on Thursday Jul 06, 2017 at 9am. Thank you. Contact information: [address] [place] [address] [phone]        [facility] [place] [facility]. Go on 08/02/2017.   Why:  Please follow up with Dr.  Donneta Romberg on Wedbesday June 5th, 2019 at 11:30am to follow up for Oncology. Thank you.          Next level of care provider has access to [facility] Link:no  Safety Planning and Suicide Prevention discussed: Yes,  Completed with patient  Have you used any form of tobacco in the last 30 days? (Cigarettes, Smokeless Tobacco, Cigars, and/or Pipes): Yes  Has patient been referred to the Quitline?: Patient refused referral  Patient has been referred for addiction treatment: Yes  [name], LCSW 07/04/2017, 8:50 AM

## 2017-07-04 NOTE — Discharge Instructions (Addendum)
Next haldol injection will be due on 07/31/17. You can get this at [facility] follow up. You will continue oral Haldol for 1 month until the injection becomes theraputic  You were also found to have low white blood cells during your admission. This improved by end of hospitalization. Lithium can help increase this level. You will need to be seen at oncology clinic in June to recheck white blood cells.

## 2017-07-04 NOTE — Progress Notes (Signed)
Patient continues to pace the floor on the unit at this time, denies complaints of SI/HI/AVH and pain. Compliant with medication, meals and unit guidelines. Patient discharged on above date and time by his mother to go home and follow up with [facility] on Thursday May 9th at 0900. Patient verbalized understanding the discharge information provided by RN upon departure. No distress noted.

## 2017-07-04 NOTE — [facility] Suicide Risk Assessment (Addendum)
[facility] Discharge Suicide Risk Assessment   Principal Problem: Schizophrenia [facility]) Discharge Diagnoses:  Patient Active Problem List   Diagnosis Date Noted  . Schizophrenia (HCC) [F20.9] 06/26/2017    Priority: High  . Other neutropenia (HCC) [D70.8]       Mental Status Per Nursing Assessment::   On Admission:  NA(currently denies SI/HI/AVH)  Demographic Factors:  Male and Unemployed  Loss Factors: NA  Historical Factors: NA  Risk Reduction Factors:   Living with another person, especially a relative and Positive social support  Continued Clinical Symptoms:  Schizophrenia:   Less than 63 years old  Cognitive Features That Contribute To Risk:  None    Suicide Risk:  Minimal: No identifiable suicidal ideation.    Follow-up Information    [facility], [employer]. Go on 07/06/2017.   Why:  Please follow up with [facility] on Thursday Jul 06, 2017 at 9am. Thank you. Contact information: [address] [place] [address] [phone]        [facility]. Go on 08/02/2017.   Why:  Please follow up with Dr.  Donneta Romberg on Wedbesday June 5th, 2019 at 11:30am to follow up for Oncology. Thank you.          Plan Of Care/Follow-up recommendations: Follow up with [facility] on 5/9-Next haldol injection is due on 07/31/17. Also follow up with [facility] to monitor your white blood cells  Haskell Riling, MD 07/04/2017, 9:01 AM

## 2017-07-05 ENCOUNTER — Telehealth: Payer: Self-pay | Admitting: Internal Medicine

## 2017-07-05 DIAGNOSIS — D708 Other neutropenia: Secondary | ICD-10-CM

## 2017-07-05 NOTE — Addendum Note (Signed)
Addended by: Georgina Snell R on: 07/05/2017 12:20 PM   Modules accepted: Orders

## 2017-07-05 NOTE — Telephone Encounter (Signed)
Lab Orders added per md order

## 2017-07-05 NOTE — Telephone Encounter (Signed)
Pt has appt with me on June 5th/have him keep this appt; but add labs [cbc/cmp]. Thx

## 2017-07-10 NOTE — Hospital Discharge Follow-Up (Signed)
Received phone call from his mother regarding medciations. He has follow up with [facility] on 5/16. She states taht the medications are helping his thought process. However, she feels he may be overmedicated. He is slurring his words and drooling. Discussed to decrease oral Haldol to 1/2 tablet daily and to d/c Lithium for now. He was on low dose Lithium for neutropenia. He will follow up with [facility].

## 2017-08-02 ENCOUNTER — Inpatient Hospital Stay: Payer: Self-pay

## 2017-08-02 ENCOUNTER — Inpatient Hospital Stay: Payer: Self-pay | Admitting: Internal Medicine

## 2018-10-17 IMAGING — CT CT HEAD W/O CM
3 series · 15 of 46 positions shown, 18 images · non-contrast
Comparison: None.

CLINICAL DATA: Altered mental status.

EXAM:
CT HEAD WITHOUT CONTRAST
TECHNIQUE: Contiguous axial images were obtained from the base of the skull
through the vertex without intravenous contrast.

[Series 2: head wo · axial · 0.47mm/px · z∈[-161,-41]mm · 9 of 29 slices shown, 12 images]
[im 3/29  brain]
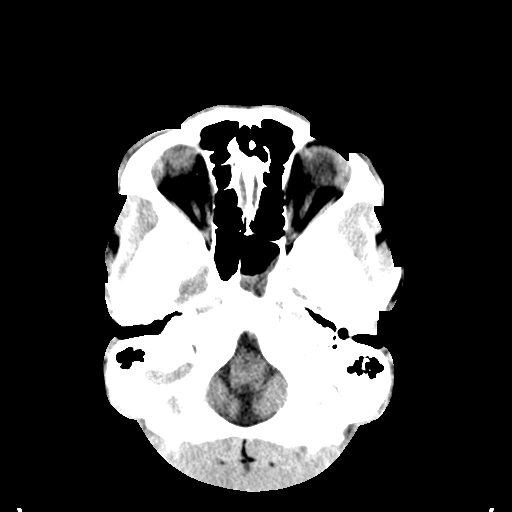
[im 3/29  bone]
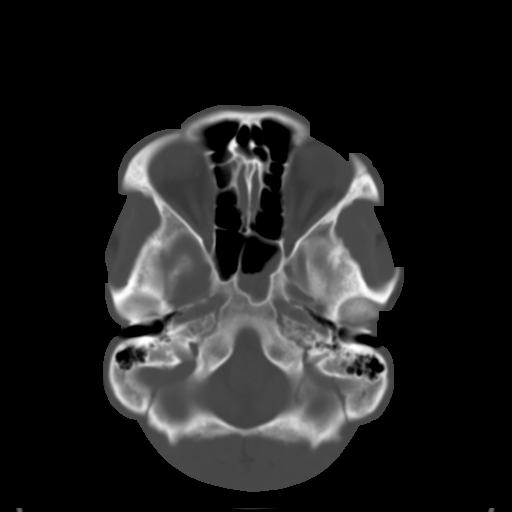
[im 6/29  brain]
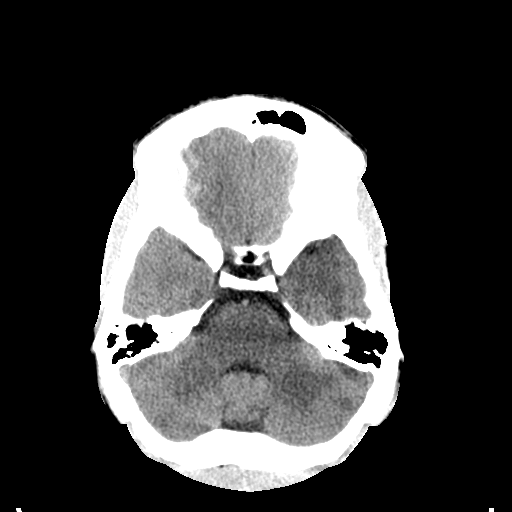
[im 9/29  brain]
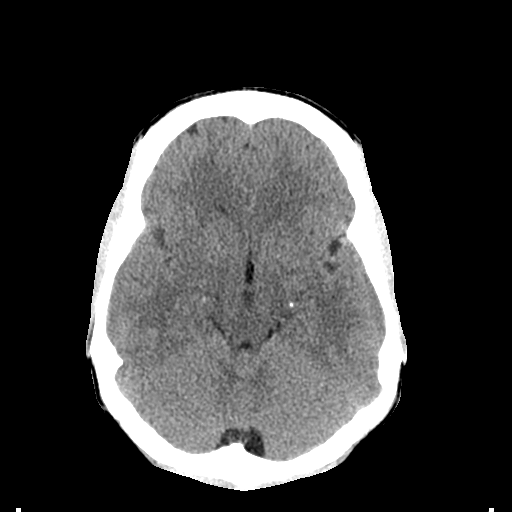
[im 12/29  brain]
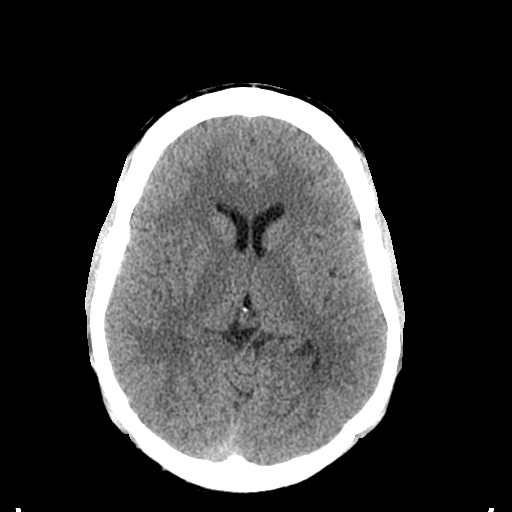
[im 15/29  brain]
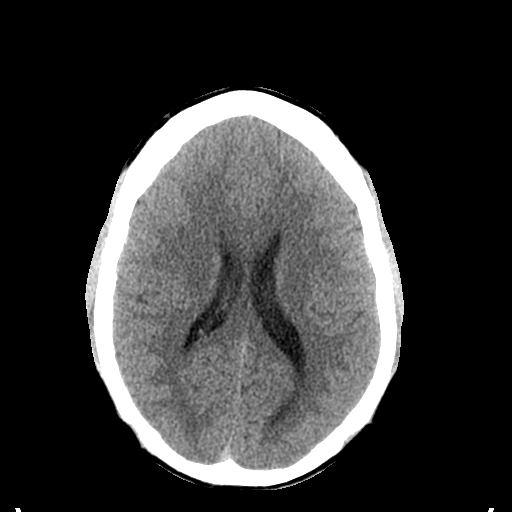
[im 15/29  bone]
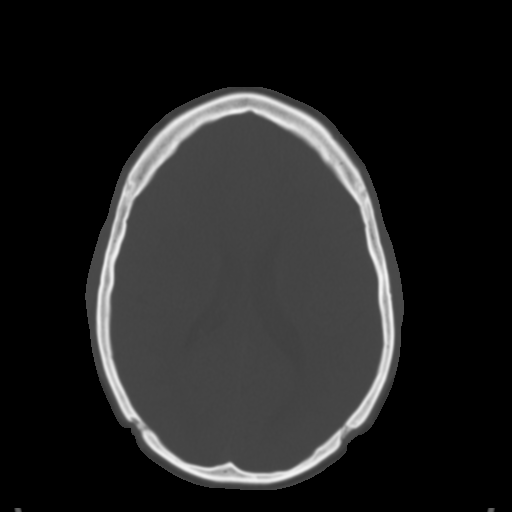
[im 18/29  brain]
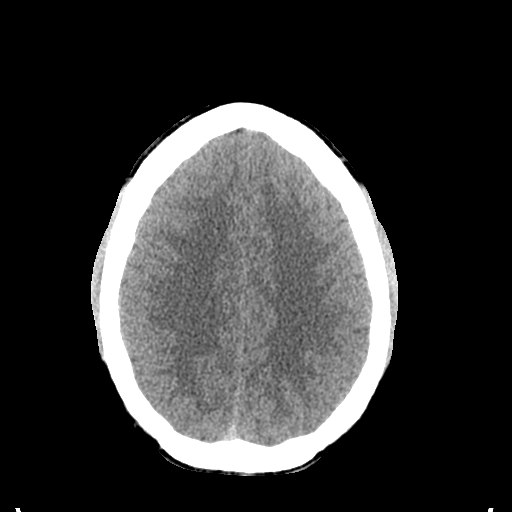
[im 21/29  brain]
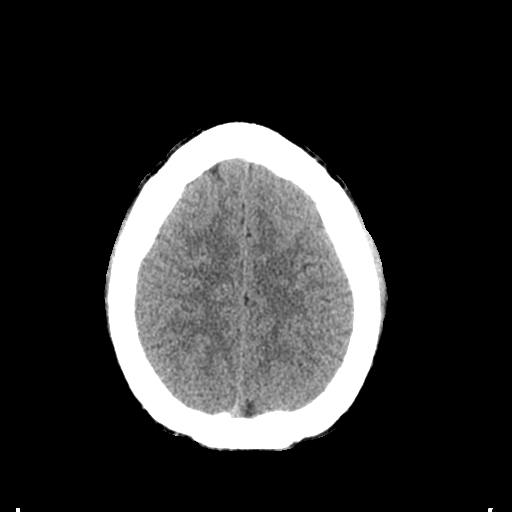
[im 24/29  brain]
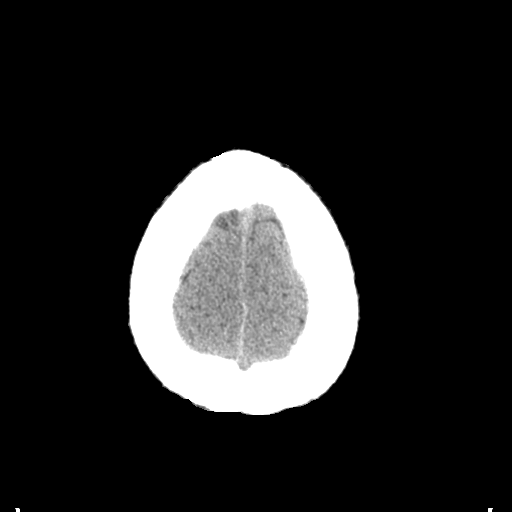
[im 27/29  brain]
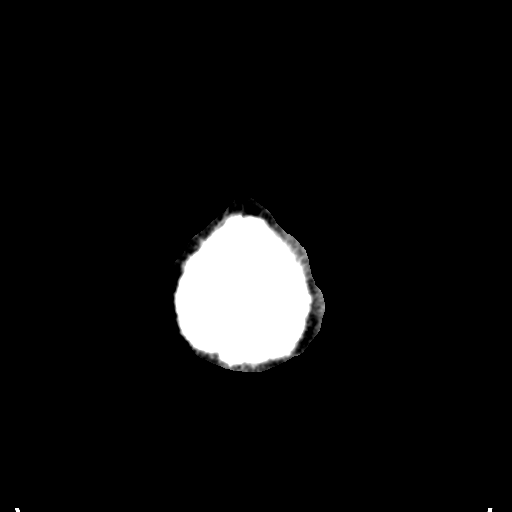
[im 27/29  bone]
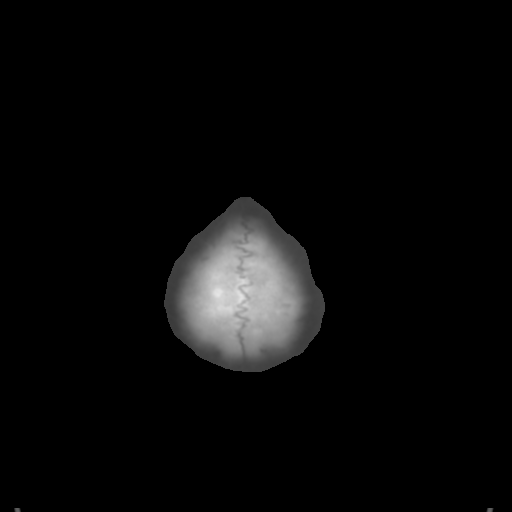

[Series 4: coronal soft tissue · coronal · 0.29mm/px · 3 of 69 slices shown]
[im 23/69  brain]
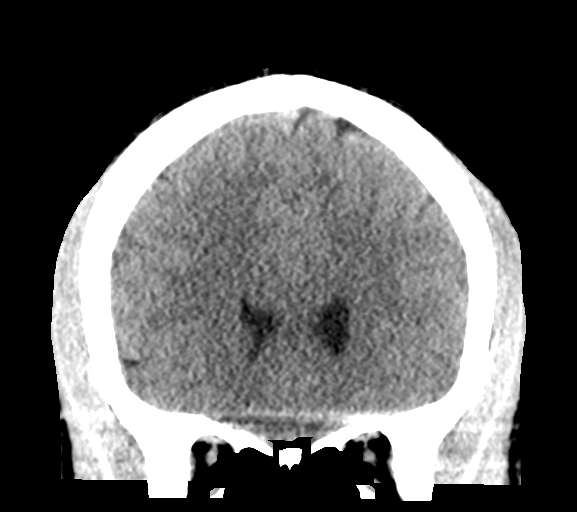
[im 31/69  brain]
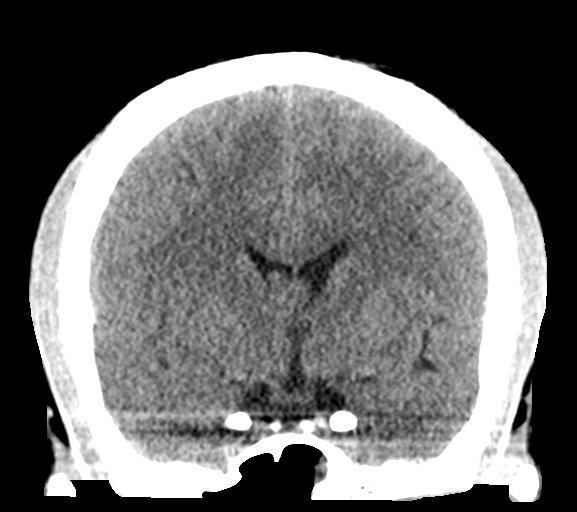
[im 38/69  brain]
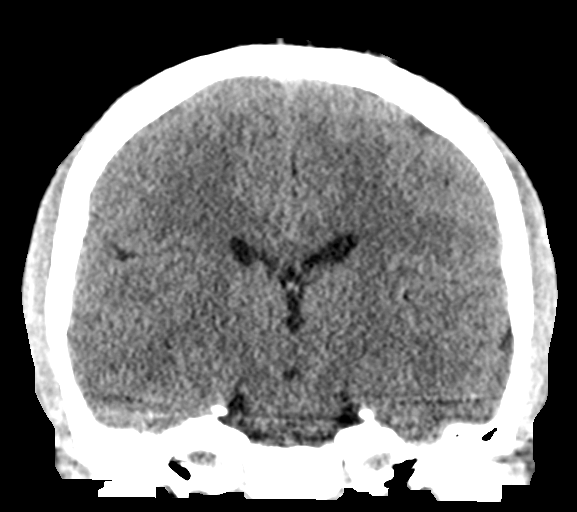

[Series 5: sagittal soft tissue · sagittal · 0.29mm/px · 3 of 57 slices shown]
[im 19/57  brain]
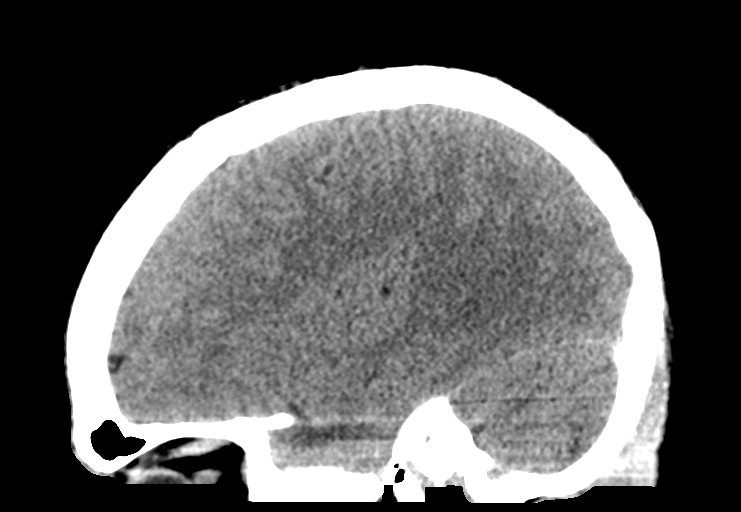
[im 29/57  brain]
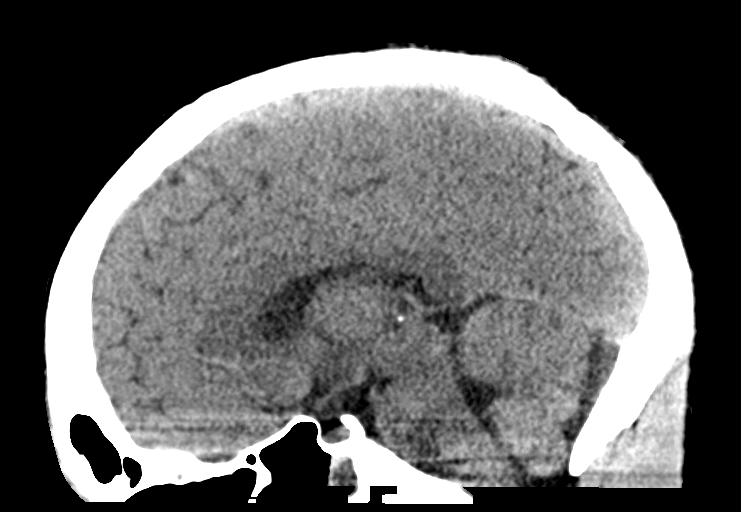
[im 38/57  brain]
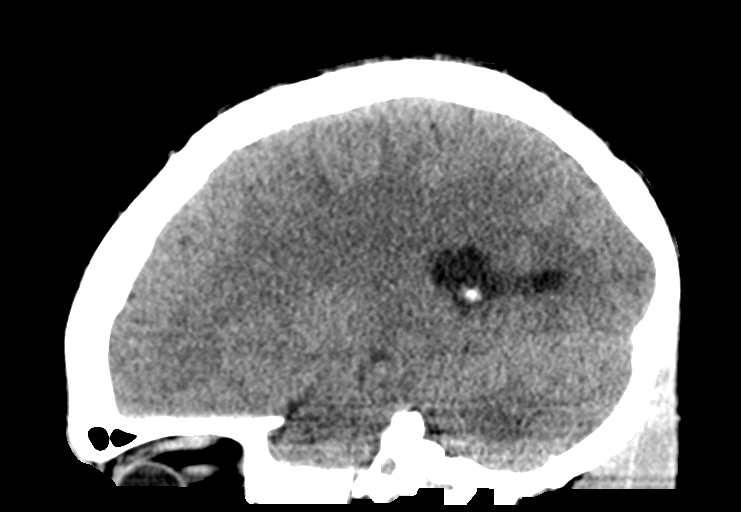

[15 of 46 positions shown; findings below may reference images not displayed]

FINDINGS: Brain: Ventricles, cisterns and other CSF spaces are within normal.
There is no mass, mass effect, shift of midline structures or acute
hemorrhage. No evidence of acute infarction.

Vascular: No hyperdense vessel or unexpected calcification.

Skull: Normal. Negative for fracture or focal lesion.

Sinuses/Orbits: Orbits are normal. Paranasal sinuses are well
developed as there is opacification over the left sphenoid sinus and
minimally over the posterior left ethmoid air cells. Mastoid air
cells are clear.

Other: None.
IMPRESSION: No acute findings.

Chronic inflammatory change involving the left sphenoid sinus and
posterior ethmoid air cells.

## 2018-10-20 IMAGING — US US ABDOMEN LIMITED
1 series · 14 of 15 positions shown · non-contrast
Comparison: None.

CLINICAL DATA: ?  Splenomegaly

EXAM:
ULTRASOUND ABDOMEN LIMITED

[Series 1: us abdomen limited · 14 of 15 slices shown]
[im 1/15]
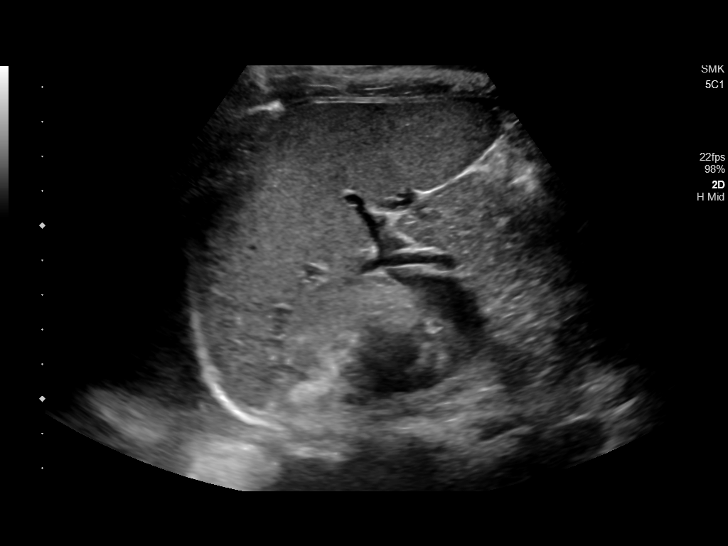
[im 2/15]
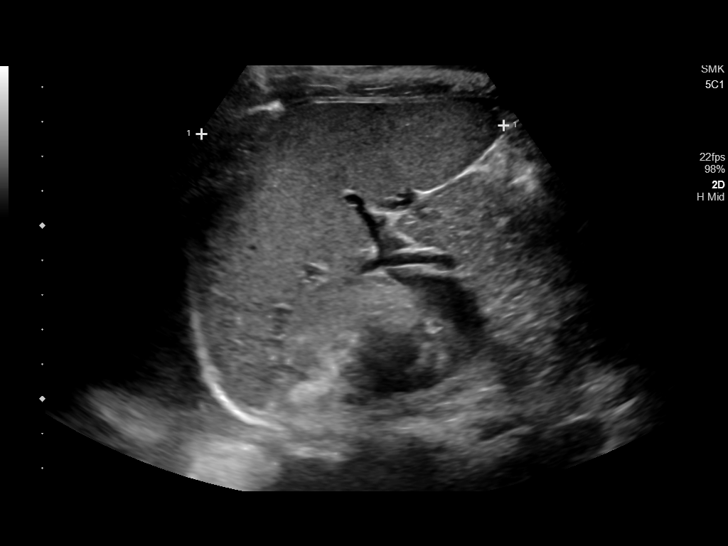
[im 3/15]
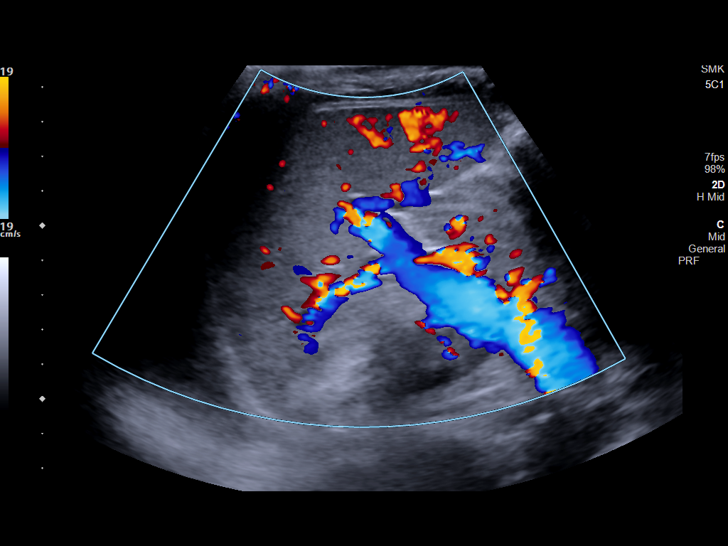
[im 4/15]
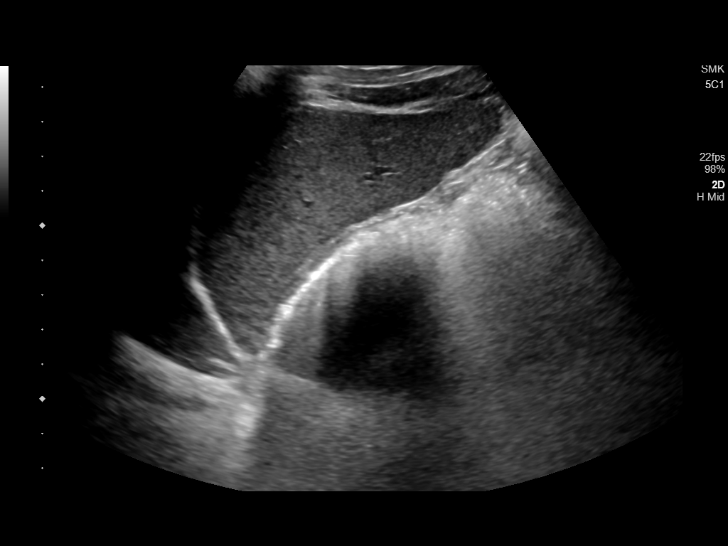
[im 5/15]
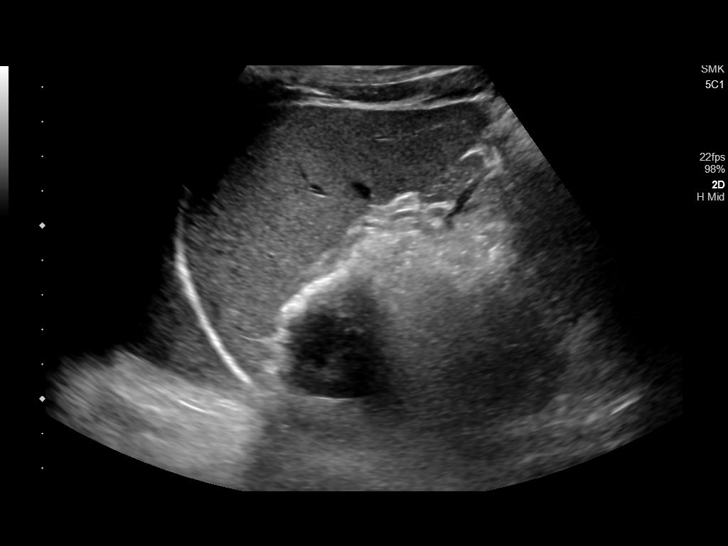
[im 6/15]
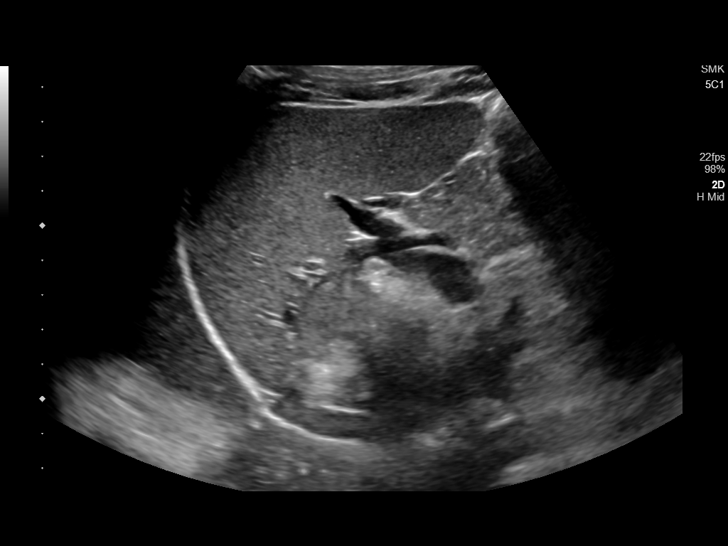
[im 7/15]
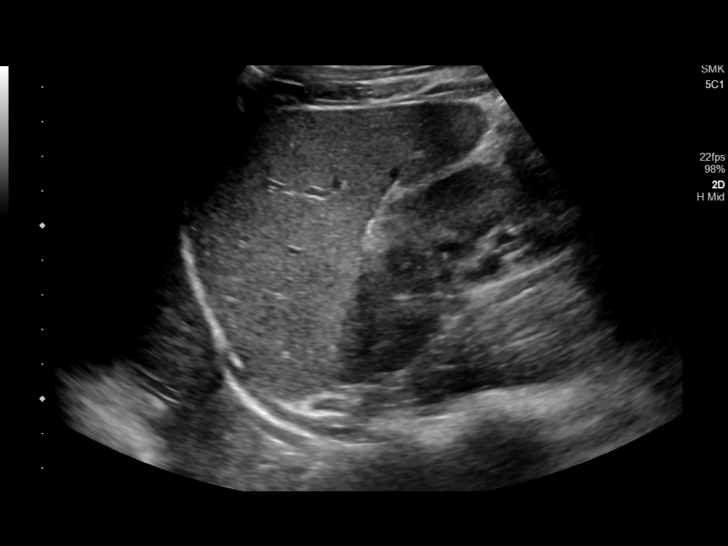
[im 9/15]
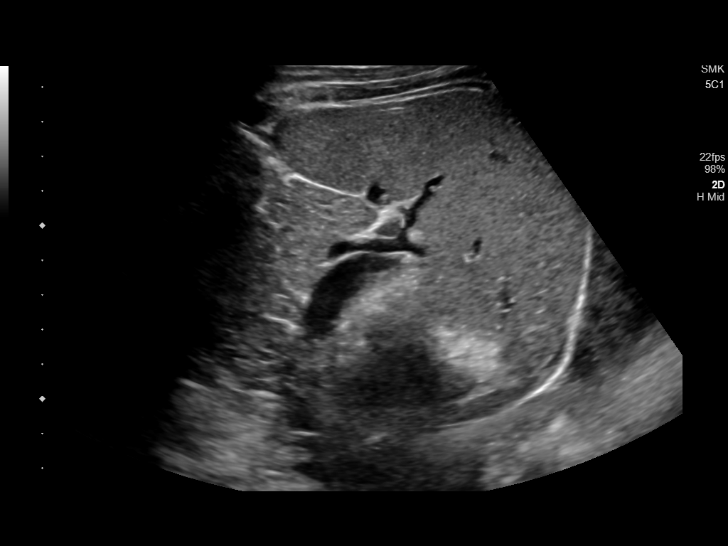
[im 10/15]
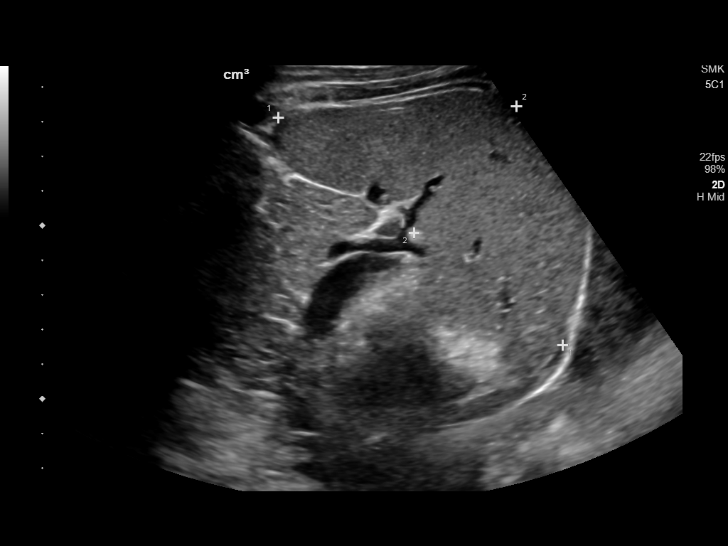
[im 11/15]
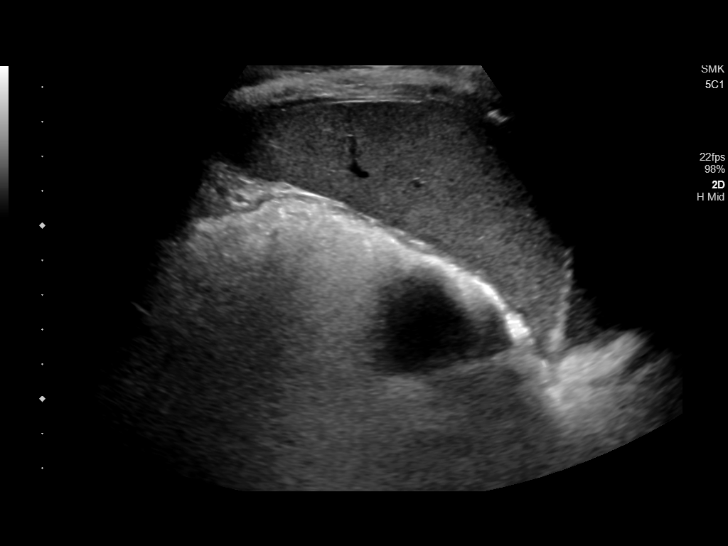
[im 12/15]
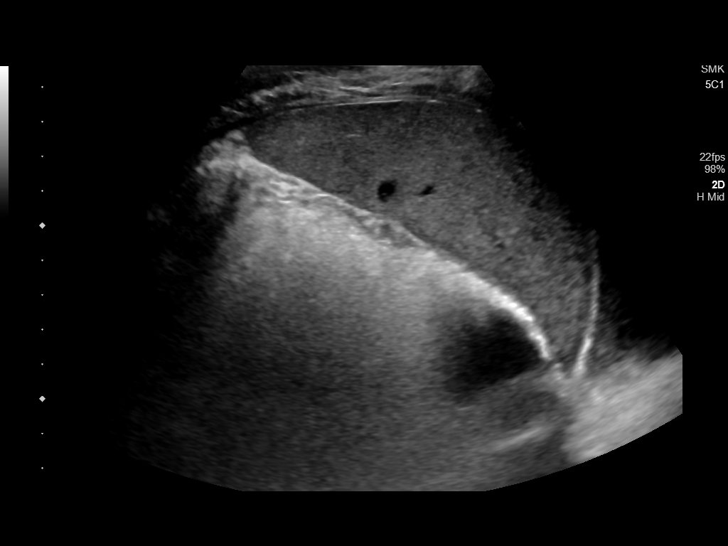
[im 13/15]
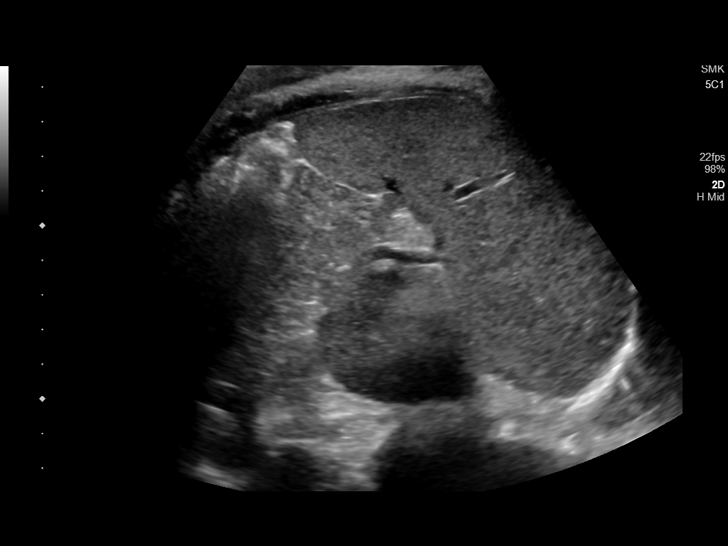
[im 14/15]
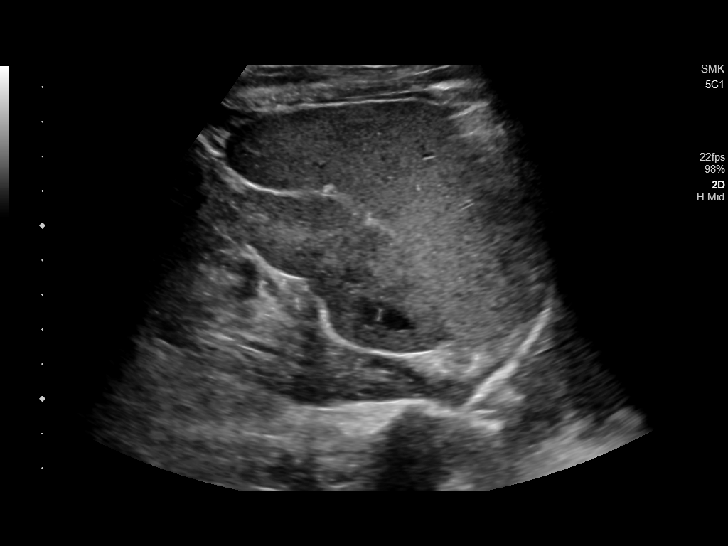
[im 15/15]
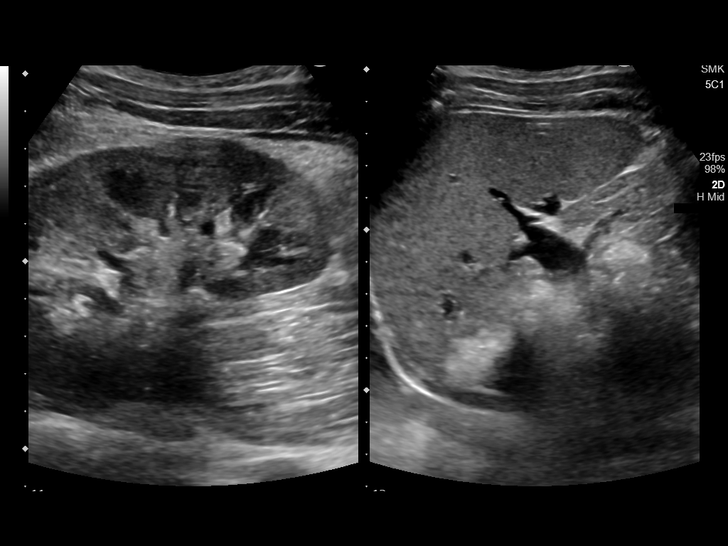

[14 of 15 positions shown; findings below may reference images not displayed]

FINDINGS: No focal splenic abnormality. The spleen measures 8.7 by 4.7 x
cm with calculated volume of 225.5 cubic cm.
IMPRESSION: Spleen is within normal limits.

## 2019-11-30 ENCOUNTER — Encounter (HOSPITAL_COMMUNITY): Payer: Self-pay | Admitting: Emergency Medicine

## 2019-11-30 ENCOUNTER — Emergency Department (HOSPITAL_COMMUNITY)
Admission: EM | Admit: 2019-11-30 | Discharge: 2019-12-03 | Disposition: A | Discharge status: Other Acute Inpatient Hospital | Payer: Self-pay | Attending: Emergency Medicine | Admitting: Emergency Medicine

## 2019-11-30 ENCOUNTER — Other Ambulatory Visit: Payer: Self-pay

## 2019-11-30 ENCOUNTER — Ambulatory Visit (HOSPITAL_COMMUNITY)
Admission: EM | Admit: 2019-11-30 | Discharge: 2019-11-30 | Disposition: A | Discharge status: Other Acute Inpatient Hospital | Payer: No Payment, Other | Attending: Psychiatry | Admitting: Psychiatry

## 2019-11-30 DIAGNOSIS — Z046 Encounter for general psychiatric examination, requested by authority: Secondary | ICD-10-CM

## 2019-11-30 DIAGNOSIS — F22 Delusional disorders: Secondary | ICD-10-CM | POA: Diagnosis not present

## 2019-11-30 DIAGNOSIS — F209 Schizophrenia, unspecified: Secondary | ICD-10-CM | POA: Diagnosis not present

## 2019-11-30 DIAGNOSIS — F17210 Nicotine dependence, cigarettes, uncomplicated: Secondary | ICD-10-CM | POA: Insufficient documentation

## 2019-11-30 DIAGNOSIS — F250 Schizoaffective disorder, bipolar type: Secondary | ICD-10-CM | POA: Insufficient documentation

## 2019-11-30 DIAGNOSIS — R259 Unspecified abnormal involuntary movements: Secondary | ICD-10-CM | POA: Insufficient documentation

## 2019-11-30 DIAGNOSIS — R454 Irritability and anger: Secondary | ICD-10-CM | POA: Diagnosis not present

## 2019-11-30 DIAGNOSIS — Z20822 Contact with and (suspected) exposure to covid-19: Secondary | ICD-10-CM | POA: Insufficient documentation

## 2019-11-30 LAB — RAPID URINE DRUG SCREEN, HOSP PERFORMED
Amphetamines: NOT DETECTED
Barbiturates: NOT DETECTED
Benzodiazepines: NOT DETECTED
Cocaine: NOT DETECTED
Opiates: NOT DETECTED
Tetrahydrocannabinol: NOT DETECTED

## 2019-11-30 LAB — CBC
HCT: 38.6 % — ABNORMAL LOW (ref 39.0–52.0)
Hemoglobin: 12.4 g/dL — ABNORMAL LOW (ref 13.0–17.0)
MCH: 24.8 pg — ABNORMAL LOW (ref 26.0–34.0)
MCHC: 32.1 g/dL (ref 30.0–36.0)
MCV: 77.4 fL — ABNORMAL LOW (ref 80.0–100.0)
Platelets: 348 10*3/uL (ref 150–400)
RBC: 4.99 MIL/uL (ref 4.22–5.81)
RDW: 13.5 % (ref 11.5–15.5)
WBC: 3.8 10*3/uL — ABNORMAL LOW (ref 4.0–10.5)
nRBC: 0 % (ref 0.0–0.2)

## 2019-11-30 LAB — COMPREHENSIVE METABOLIC PANEL
ALT: 20 U/L (ref 0–44)
AST: 34 U/L (ref 15–41)
Albumin: 4.2 g/dL (ref 3.5–5.0)
Alkaline Phosphatase: 45 U/L (ref 38–126)
Anion gap: 10 (ref 5–15)
BUN: 10 mg/dL (ref 6–20)
CO2: 25 mmol/L (ref 22–32)
Calcium: 8.6 mg/dL — ABNORMAL LOW (ref 8.9–10.3)
Chloride: 101 mmol/L (ref 98–111)
Creatinine, Ser: 1.15 mg/dL (ref 0.61–1.24)
GFR calc Af Amer: >60 mL/min (ref >60)
GFR calc non Af Amer: >60 mL/min (ref >60)
Glucose, Bld: 91 mg/dL (ref 70–99)
Potassium: 4 mmol/L (ref 3.5–5.1)
Sodium: 136 mmol/L (ref 135–145)
Total Bilirubin: 0.5 mg/dL (ref 0.3–1.2)
Total Protein: 7.1 g/dL (ref 6.5–8.1)

## 2019-11-30 LAB — ACETAMINOPHEN LEVEL: Acetaminophen (Tylenol), Serum: <10 ug/mL — ABNORMAL LOW (ref 10–30)

## 2019-11-30 LAB — ETHANOL: Alcohol, Ethyl (B): 219 mg/dL — ABNORMAL HIGH (ref <10)

## 2019-11-30 LAB — SALICYLATE LEVEL: Salicylate Lvl: 8.4 mg/dL (ref 7.0–30.0)

## 2019-11-30 NOTE — ED Provider Notes (Signed)
[facility] Medical Screening Exam  Patient Name: Nathan Barnett. MRN: [id number] Date of Evaluation: 11/30/19 Chief Complaint: Chief Complaint/Presenting Problem: Patient presented to [facility] with his mother seeking an evaluation. .Patient has been diagnosed with schizophrenia in the past.  He has been living in [place] and was working for [employer], but states that he lost his job and he has been staying "here and there."  Mother states that patient has not been able to get a job because he does not present well due to his mental illness being unstable.  Mother states that patient's brother lives in [place] and he called patient's mother to let her know that patient was not doing well.  She states that patient has not been medications in a good while.  Patient states that he does not want to be on medications.  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  He states that one side is nice and cooperative whereas the otherone causes him to be erratic and screaming and mother states, "punching walls."  Patient states that he has been admitted to mental health facilities in the past, but states thjat he was not prescribed any medication.  Mother states that patient does not want to take his medication and subsequently falls apart when he is not taking them.  Patient denies any current SI/HI/AVH and states that he does nto use any alcohol or drugs. Patient states that he has not been eating or sleeping well. Diagnosis:  Final diagnoses:  None    History of Present illness: Nathan Barnett. is a 29 y.o. male.with bizarre behavior, paranoia, irritability and psychotic presentation. Patient was very guarded and grudingly cooperative. He originally agreed to come inpatient, and then changed his mind. He then agreed to come in observation, yet changed his  mind again. He was defensive throughout and observed to be smiling and laughing inappropriately. He continue to ask writer to repeat herself, with simple questions. He appears to be thought blocking and respoding to internal stimuli at times. He denies suicidal ideation and homicidal ideations.   Psychiatric Specialty Exam  Presentation  General Appearance:Bizarre;Disheveled  Eye Contact:Fleeting  Speech:Clear and Coherent;Slow;Other (comment) (blocked at times)  Speech Volume:Normal  Handedness:Right   Mood and Affect  Mood:Anxious;Irritable (defensive and paranoid)  Affect:Congruent;Inappropriate;Full Range   Thought Process  Thought Processes:Irrevelant  Descriptions of Associations:Tangential  Orientation:Full (Time, Place and Person)  Thought Content:Illogical;Scattered;Rumination  Hallucinations:None  Ideas of Reference:Paranoia  Suicidal Thoughts:No  Homicidal Thoughts:No   Sensorium  Memory:Immediate Fair;Recent Fair;Remote Fair  Judgment:Poor  Insight:Poor   Executive Functions  Concentration:Poor  Attention Span:Fair  Recall:Poor  Fund of Knowledge:Fair  Language:Fair   Psychomotor Activity  Psychomotor Activity:Increased;Restlessness;Other (comment) (pacing)   Assets  Assets:Communication Skills;Financial Resources/Insurance;Leisure Time   Sleep  Sleep:Poor  Number of hours: No data recorded  Physical Exam: Physical Exam ROS Blood pressure (!) 149/90, pulse (!) 114, temperature (!) 97.1 F (36.2 C), temperature source Tympanic, height 6' (1.829 m), weight 150 lb (68 kg), SpO2 99 %. Body mass index is 20.34 kg/m.  Musculoskeletal: Strength & Muscle Tone: within normal limits Gait & Station: normal Patient leans: N/A   [facility] MSE Discharge Disposition for Follow up and Recommendations: Based on my evaluation I certify that psychiatric inpatient services furnished can reasonably be expected to improve the patient's condition  which I recommend transfer to  an appropriate accepting facility.  Patient had to be placed under IVC. This nurse practitioner completed the IVC and affidavit and petition, we waited about 1 hour or more for a notary. Patient continues to be very psychotic and paranoia, walking the walks while we await for the notary public. Will transfer to local facility once papers aare notarized. Security and Sherriff have been notified and are monitoring the patient via CCTV. Will continue to monitor. Will recommend inpatient admission once he is medically cleared.   Maryagnes Amos, FNP 11/30/2019, 2:28 PM

## 2019-11-30 NOTE — ED Triage Notes (Signed)
Pt to triage via [employer] from [facility] with IVC papers.  Per papers, pt acting bizarre, paranoid, and psychotic.  Reports he was pacing the floors, smiling inappropriately, and laughing at times.  Per papers pt has also caused property damage at home.  Pt denies SI/HI and states he feels fine.  Pt denies any medical history.

## 2019-11-30 NOTE — Progress Notes (Signed)
Nathan Barnett was IVC and the sheriff arrived to transport him without incident.

## 2019-11-30 NOTE — ED Notes (Signed)
RN notified of elevated vitals

## 2019-11-30 NOTE — ED Notes (Signed)
No patient belonings

## 2019-11-30 NOTE — ED Notes (Signed)
Patient pacing hallway, screaming profanities, and hitting walls. Techs continue to monitor patient for safety.
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
## 2019-12-01 LAB — RESPIRATORY PANEL BY RT PCR (FLU A&B, COVID)
Influenza A by PCR: NEGATIVE
Influenza B by PCR: NEGATIVE
SARS Coronavirus 2 by RT PCR: NEGATIVE

## 2019-12-01 NOTE — BHH Counselor (Signed)
Pt remains at ED under IVC.  Pt has schizoaffective disorder and per report has not been compliant with medication.  Pt was reassessed.  Pt was blunted in affect.  Pt had little insight into why he was at the hospital -- ''I'm here because I need to get away from my family.''  Pt denied suicidal ideation, homicidal ideation, hallucination, and self-injurious behavior.  ''Just do what you have to do.''  Recommend continued inpatient.

## 2019-12-01 NOTE — ED Notes (Signed)
Lunch Tray Ordered @ 1715. 

## 2019-12-01 NOTE — ED Notes (Signed)
Breakfast Ordered 

## 2019-12-01 NOTE — ED Provider Notes (Signed)
[facility] EMERGENCY DEPARTMENT Provider Note   CSN: [id number] Arrival date & time: 11/30/19  1622     History Chief Complaint  Patient presents with   IVC    Nathan Barnett. is a 29 y.o. male.  The history is provided by the patient and medical records.   29 year old male with history of schizoaffective disorder, presenting to the ED under IVC petitioned by staff at [facility].  Patient was initially taken to their facility by his mother for psychiatric help.  Reportedly he has been off of his medications for quite some time and staying with brother in [place].  Brother called mother to notify her that he was not doing well mentally.  Mother reports he is essentially homeless and has been living in his car.  Apparently he has made some statements about wishing he was dead, has been punching walls at her home and have been talking with people who are not there.  While at [facility] patient began acting erratically, yelling, cursing, hitting doors, laughing inappropriately.  Patient refuses to talk to me here in the ED.  He states "I am quite tired right now honestly, I can't do this".    Per psychiatry, patient meets criteria for inpatient treatment.  He was sent here for medical clearance and awaiting bed placement.  History reviewed. No pertinent past medical history.  Patient Active Problem List   Diagnosis Date Noted   Schizoaffective disorder, bipolar type (HCC)    Other neutropenia (HCC)    Schizophrenia (HCC) 06/26/2017    History reviewed. No pertinent surgical history.     No family history on file.  Social History   Tobacco Use   Smoking status: Current Some Day Smoker    Types: Cigarettes   Smokeless tobacco: Never Used  Substance Use Topics   Alcohol use: Not Currently   Drug use: Not Currently    Home Medications Prior to Admission medications   Medication Sig Start Date End Date Taking? Authorizing Provider  haloperidol  (HALDOL) 10 MG tablet Take 1 tablet (10 mg total) by mouth at bedtime. 07/03/17   McNew, Ileene Hutchinson, MD  haloperidol decanoate (HALDOL DECANOATE) 100 MG/ML injection Inject 1 mL (100 mg total) into the muscle every 28 (twenty-eight) days. 07/31/17   McNew, Ileene Hutchinson, MD  lithium carbonate (LITHOBID) 300 MG CR tablet Take 1 tablet (300 mg total) by mouth at bedtime. 07/03/17   McNew, Ileene Hutchinson, MD    Allergies    Patient has no known allergies.  Review of Systems   Review of Systems  Psychiatric/Behavioral: Positive for behavioral problems.  All other systems reviewed and are negative.   Physical Exam Updated Vital Signs BP (!) 143/92 (BP Location: Left Arm)    Pulse 61    Temp 98.5 F (36.9 C) (Oral)    Resp 16    SpO2 100%   Physical Exam Vitals and nursing note reviewed.  Constitutional:      Appearance: He is well-developed.  HENT:     Head: Normocephalic and atraumatic.  Eyes:     Conjunctiva/sclera: Conjunctivae normal.     Pupils: Pupils are equal, round, and reactive to light.  Cardiovascular:     Rate and Rhythm: Normal rate and regular rhythm.     Heart sounds: Normal heart sounds.  Pulmonary:     Effort: Pulmonary effort is normal. No respiratory distress.     Breath sounds: Normal breath sounds. No rhonchi.  Abdominal:  General: Bowel sounds are normal.     Palpations: Abdomen is soft.     Tenderness: There is no abdominal tenderness. There is no rebound.  Musculoskeletal:        General: Normal range of motion.     Cervical back: Normal range of motion.  Skin:    General: Skin is warm and dry.  Neurological:     Mental Status: He is alert and oriented to person, place, and time.  Psychiatric:     Comments: Appears agitated, staring at the floor, unwilling to participate in exam     ED Results / Procedures / Treatments   Labs (all labs ordered are listed, but only abnormal results are displayed) Labs Reviewed  COMPREHENSIVE METABOLIC PANEL - Abnormal; Notable  for the following components:      Result Value   Calcium 8.6 (*)    All other components within normal limits  ETHANOL - Abnormal; Notable for the following components:   Alcohol, Ethyl (B) 219 (*)    All other components within normal limits  ACETAMINOPHEN LEVEL - Abnormal; Notable for the following components:   Acetaminophen (Tylenol), Serum <10 (*)    All other components within normal limits  CBC - Abnormal; Notable for the following components:   WBC 3.8 (*)    Hemoglobin 12.4 (*)    HCT 38.6 (*)    MCV 77.4 (*)    MCH 24.8 (*)    All other components within normal limits  RESPIRATORY PANEL BY RT PCR (FLU A&B, COVID)  SALICYLATE LEVEL  RAPID URINE DRUG SCREEN, HOSP PERFORMED    EKG None  Radiology No results found.  Procedures Procedures (including critical care time)  Medications Ordered in ED Medications - No data to display  ED Course  I have reviewed the triage vital signs and the nursing notes.  Pertinent labs & imaging results that were available during my care of the patient were reviewed by me and considered in my medical decision making (see chart for details).    MDM Rules/Calculators/A&P  28 y.o. M here under IVC petitioned by staff at [facility].  He was taken there by his mother after report from sibling that he was not doing well from a mental standpoint.  He has been off his medications for quite a while.  Apparently he made some statements about wishing he was dead and has displayed some aggressive behavior.  Here, patient seems agitated.  He will not really talk to me stating "I am really tired, I already answered these questions".  Labs as above, ethanol 219.  RT panel pending.   Awaiting IP placement.  Final Clinical Impression(s) / ED Diagnoses Final diagnoses:  Involuntary commitment    Rx / DC Orders ED Discharge Orders    None       Garlon Hatchet, PA-C 12/01/19 0938    Pricilla Loveless, MD 12/01/19 [phone]

## 2019-12-02 MED ORDER — LORAZEPAM 1 MG PO TABS
1.0000 mg | ORAL_TABLET | Freq: Once | ORAL | Status: AC
  Administered 2019-12-02: 1 mg via ORAL
  Filled 2019-12-02: qty 1

## 2019-12-02 NOTE — Progress Notes (Signed)
RN spoke to mom.  Mom asking for update on pt's status.  RN took down mom's contact information and informed [facility] ED that pt's mom requesting call back.  RN spoke to Keddie in ED.    Mom's phone number [phone].  Mom's name is Sulane.  Kriste Basque said she would forward mom's contact information to pt's nurse.

## 2019-12-02 NOTE — ED Notes (Signed)
Please call mom [name] with an update [phone]

## 2019-12-03 ENCOUNTER — Encounter (HOSPITAL_COMMUNITY): Payer: Self-pay | Admitting: Behavioral Health

## 2019-12-03 ENCOUNTER — Other Ambulatory Visit: Payer: Self-pay

## 2019-12-03 ENCOUNTER — Inpatient Hospital Stay (HOSPITAL_COMMUNITY)
Admission: AD | Admit: 2019-12-03 | Discharge: 2019-12-09 | DRG: 885 | Disposition: A | Discharge status: Home / Self Care | Payer: Federal, State, Local not specified - Other | Attending: Psychiatry | Admitting: Psychiatry

## 2019-12-03 DIAGNOSIS — F22 Delusional disorders: Secondary | ICD-10-CM | POA: Diagnosis present

## 2019-12-03 DIAGNOSIS — Z9114 Patient's other noncompliance with medication regimen: Secondary | ICD-10-CM | POA: Diagnosis not present

## 2019-12-03 DIAGNOSIS — F200 Paranoid schizophrenia: Secondary | ICD-10-CM | POA: Diagnosis not present

## 2019-12-03 DIAGNOSIS — F29 Unspecified psychosis not due to a substance or known physiological condition: Secondary | ICD-10-CM | POA: Diagnosis present

## 2019-12-03 DIAGNOSIS — F32A Depression, unspecified: Secondary | ICD-10-CM | POA: Diagnosis present

## 2019-12-03 DIAGNOSIS — G4700 Insomnia, unspecified: Secondary | ICD-10-CM | POA: Diagnosis present

## 2019-12-03 DIAGNOSIS — F259 Schizoaffective disorder, unspecified: Secondary | ICD-10-CM | POA: Diagnosis present

## 2019-12-03 DIAGNOSIS — F1010 Alcohol abuse, uncomplicated: Secondary | ICD-10-CM | POA: Diagnosis present

## 2019-12-03 DIAGNOSIS — Z79899 Other long term (current) drug therapy: Secondary | ICD-10-CM

## 2019-12-03 DIAGNOSIS — F17210 Nicotine dependence, cigarettes, uncomplicated: Secondary | ICD-10-CM | POA: Diagnosis present

## 2019-12-03 LAB — LIPID PANEL
Cholesterol: 157 mg/dL (ref 0–200)
HDL: 68 mg/dL (ref >40)
LDL Cholesterol: 62 mg/dL (ref 0–99)
Total CHOL/HDL Ratio: 2.3 RATIO
Triglycerides: 135 mg/dL (ref <150)
VLDL: 27 mg/dL (ref 0–40)

## 2019-12-03 LAB — HEMOGLOBIN A1C
Hgb A1c MFr Bld: 4.8 % (ref 4.8–5.6)
Mean Plasma Glucose: 91.06 mg/dL

## 2019-12-03 LAB — TSH: TSH: 1.542 u[IU]/mL (ref 0.350–4.500)

## 2019-12-03 MED ORDER — HALOPERIDOL 5 MG PO TABS
5.0000 mg | ORAL_TABLET | Freq: Every day | ORAL | Status: DC
  Administered 2019-12-03: 5 mg via ORAL
  Filled 2019-12-03: qty 1

## 2019-12-03 MED ORDER — HALOPERIDOL 5 MG PO TABS
5.0000 mg | ORAL_TABLET | Freq: Every day | ORAL | Status: DC
  Filled 2019-12-03: qty 1

## 2019-12-03 MED ORDER — LITHIUM CARBONATE 150 MG PO CAPS
150.0000 mg | ORAL_CAPSULE | Freq: Every day | ORAL | Status: DC
  Filled 2019-12-03: qty 1

## 2019-12-03 MED ORDER — LORAZEPAM 1 MG PO TABS
2.0000 mg | ORAL_TABLET | Freq: Four times a day (QID) | ORAL | Status: DC | PRN
  Administered 2019-12-03: 2 mg via ORAL
  Filled 2019-12-03: qty 2

## 2019-12-03 MED ORDER — LITHIUM CARBONATE 150 MG PO CAPS
150.0000 mg | ORAL_CAPSULE | Freq: Every day | ORAL | Status: DC
  Administered 2019-12-03: 150 mg via ORAL
  Filled 2019-12-03 (×2): qty 1

## 2019-12-03 NOTE — ED Notes (Signed)
Attempted to call report to [facility]- was told RN was giving nighttime meds out and would give me a call back

## 2019-12-03 NOTE — Progress Notes (Signed)
Notified staffing of sitter being required for this pt. No sitter available at this time.

## 2019-12-03 NOTE — Progress Notes (Signed)
Pt agitated at this time, provider notified

## 2019-12-03 NOTE — Progress Notes (Addendum)
Pt accepted to [facility], [address] [facility], BED [phone]     Dr. Jola Babinski is the attending provider.    Call report to [phone]    Pt is IVC.    Pt may be transported by [facility]   Pt scheduled  to arrive at [facility] at 8pm.  CSW unable to reach ED nurse.  Will continue to follow up.   Ladoris Gene MSW,LCSWA,LCASA Clinical Social Worker  [place] Disposition, CSW [phone] (cell)

## 2019-12-03 NOTE — Progress Notes (Signed)
Patient ID: Nathan Shears., male   DOB: 1991/01/19, 29 y.o.   MRN: [id number]   Psychiatric reassessment   In brief; Nathan Barnett is a 29 year old male who presented to the [facility] for psychiatric evaluation on  11/30/2019. Patients psychiatric history is significant for schizophrenia and per review of chart, patient was IVC'd by his mother following erratic/bizarre behaviors described as," screaming and punching walls, paranoia, irritability and psychotic presentation. Per chart review, patient has been non-complaint with his medications.   During this evaluation, patient was alert and oriented x4. He was guarded and irritable but cooperative. Eye contact poor and appearance disheveled. He looked around the room numerous times appearing paranoid. Thought blocking was also observed.  He did not  elaborate much on why he ws sin the hospital only that," because my mother felt like I needed to come here to blow off some steam." He denied any mental health symptoms and history although both are documented.  He denied feeling depressed, suicidal, homicidal. Denied visual or auditory allucinations.or paranoia. He reported that he had never been on psychotropic medications in the past although this is to documented. He reported that he does not want to go on medications. He admitted to some alcohol use reporting use as," once per week or a couple of times per month." His Ethanol on admission was 219. Her denied other substance abuse or use. Denied access to firearms. His irritability grew as the evaluation continued until he finally said," just do what you need to do." The evaluation ended at that time.   Disposition: Based on this evaluation, patient continues to present with psychosis. As such, I am continuing to recommend inpatient psychiatric hospitalization. [facility] will notify ED staff if appropriate  beds are available here providing bed assignment and time that patient can arrive, If no beds are available  patient will be referred to an appropriate facility. Patient COVID status is negative.

## 2019-12-03 NOTE — ED Notes (Signed)
[facility] called by Justyn Langham to transport pt to [facility]

## 2019-12-03 NOTE — ED Provider Notes (Signed)
Emergency Medicine Observation Re-evaluation Note  Nathan Barnett. is a 29 y.o. male, seen on rounds today.  Pt initially presented to the ED for complaints of IVC Currently, the patient is sitting in a chair in a dark room, no complaints or needs at this time.  Physical Exam  BP 124/73 (BP Location: Left Arm)   Pulse 63   Temp 97.8 F (36.6 C) (Oral)   Resp 18   SpO2 98%  Physical Exam General: alert, non toxic, no distress Lungs: respirations even and unlabored Psych: flat affect, withdrawn  ED Course / MDM  EKG:EKG Interpretation  Date/Time:  Tuesday December 03 2019 13:14:00 EDT Ventricular Rate:  57 PR Interval:  148 QRS Duration: 86 QT Interval:  400 QTC Calculation: 389 R Axis:   87 Text Interpretation: Sinus bradycardia Otherwise normal ECG No old tracing to compare Confirmed by Dione Booze ([id number]) on 12/03/2019 3:43:19 PM    I have reviewed the labs performed to date as well as medications administered while in observation.  Recent changes in the last 24 hours include placement at [facility], transport at 8PM. Received report from nurses, patient agitated, punching the walls.   Plan  Current plan is for transport to [facility] at 8. Patient is under full IVC at this time. Given order for PO ativan if needed for agitation.    Jeannie Fend, PA-C 12/03/19 Randolm Idol, MD 12/06/19 [phone]

## 2019-12-04 DIAGNOSIS — F29 Unspecified psychosis not due to a substance or known physiological condition: Secondary | ICD-10-CM | POA: Diagnosis present

## 2019-12-04 DIAGNOSIS — F200 Paranoid schizophrenia: Secondary | ICD-10-CM

## 2019-12-04 LAB — LITHIUM LEVEL: Lithium Lvl: 0.16 mmol/L — ABNORMAL LOW (ref 0.60–1.20)

## 2019-12-04 LAB — IRON AND TIBC
Iron: 40 ug/dL — ABNORMAL LOW (ref 45–182)
Saturation Ratios: 11 % — ABNORMAL LOW (ref 17.9–39.5)
TIBC: 362 ug/dL (ref 250–450)
UIBC: 322 ug/dL

## 2019-12-04 LAB — RETICULOCYTES
Immature Retic Fract: 12.8 % (ref 2.3–15.9)
RBC.: 5.09 MIL/uL (ref 4.22–5.81)
Retic Count, Absolute: 44.3 10*3/uL (ref 19.0–186.0)
Retic Ct Pct: 0.9 % (ref 0.4–3.1)

## 2019-12-04 LAB — FOLATE: Folate: 48.9 ng/mL (ref >5.9)

## 2019-12-04 LAB — FERRITIN: Ferritin: 64 ng/mL (ref 24–336)

## 2019-12-04 LAB — VITAMIN B12: Vitamin B-12: 471 pg/mL (ref 180–914)

## 2019-12-04 MED ORDER — LORAZEPAM 1 MG PO TABS
1.0000 mg | ORAL_TABLET | Freq: Four times a day (QID) | ORAL | Status: DC | PRN
  Filled 2019-12-04 (×2): qty 1

## 2019-12-04 MED ORDER — HALOPERIDOL 5 MG PO TABS
5.0000 mg | ORAL_TABLET | Freq: Every day | ORAL | Status: DC
  Administered 2019-12-04 – 2019-12-06 (×3): 5 mg via ORAL
  Filled 2019-12-04 (×6): qty 1

## 2019-12-04 MED ORDER — ZIPRASIDONE MESYLATE 20 MG IM SOLR: 20.0000 mg | Freq: Four times a day (QID) | INTRAMUSCULAR | Status: DC | PRN

## 2019-12-04 MED ORDER — LITHIUM CARBONATE 300 MG PO CAPS
300.0000 mg | ORAL_CAPSULE | Freq: Two times a day (BID) | ORAL | Status: DC
  Administered 2019-12-04: 300 mg via ORAL
  Filled 2019-12-04 (×4): qty 1

## 2019-12-04 MED ORDER — LITHIUM CARBONATE 300 MG PO CAPS
300.0000 mg | ORAL_CAPSULE | Freq: Every day | ORAL | Status: DC
  Administered 2019-12-05: 300 mg via ORAL
  Filled 2019-12-04 (×3): qty 1

## 2019-12-04 MED ORDER — OLANZAPINE 10 MG PO TBDP
10.0000 mg | ORAL_TABLET | Freq: Three times a day (TID) | ORAL | Status: DC | PRN
  Administered 2019-12-07: 10 mg via ORAL
  Filled 2019-12-04: qty 1

## 2019-12-04 MED ORDER — FOLIC ACID 1 MG PO TABS
1.0000 mg | ORAL_TABLET | Freq: Every day | ORAL | Status: DC
  Administered 2019-12-04 – 2019-12-09 (×6): 1 mg via ORAL
  Filled 2019-12-04 (×8): qty 1

## 2019-12-04 MED ORDER — LORAZEPAM 1 MG PO TABS
1.0000 mg | ORAL_TABLET | Freq: Four times a day (QID) | ORAL | Status: DC | PRN
  Administered 2019-12-07 – 2019-12-08 (×3): 1 mg via ORAL
  Filled 2019-12-04: qty 1

## 2019-12-04 MED ORDER — MAGNESIUM HYDROXIDE 400 MG/5ML PO SUSP: 30.0000 mL | Freq: Every day | ORAL | Status: DC | PRN

## 2019-12-04 MED ORDER — HALOPERIDOL 5 MG PO TABS
10.0000 mg | ORAL_TABLET | Freq: Every day | ORAL | Status: DC
  Administered 2019-12-04 – 2019-12-08 (×5): 10 mg via ORAL
  Filled 2019-12-04 (×6): qty 2
  Filled 2019-12-04: qty 14
  Filled 2019-12-04: qty 2

## 2019-12-04 MED ORDER — ACETAMINOPHEN 325 MG PO TABS: 650.0000 mg | ORAL_TABLET | Freq: Four times a day (QID) | ORAL | Status: DC | PRN

## 2019-12-04 MED ORDER — HYDROXYZINE HCL 25 MG PO TABS
25.0000 mg | ORAL_TABLET | Freq: Three times a day (TID) | ORAL | Status: DC | PRN
  Administered 2019-12-05 – 2019-12-07 (×3): 25 mg via ORAL
  Filled 2019-12-04: qty 10
  Filled 2019-12-04 (×3): qty 1

## 2019-12-04 MED ORDER — THIAMINE HCL 100 MG PO TABS
100.0000 mg | ORAL_TABLET | Freq: Every day | ORAL | Status: DC
  Administered 2019-12-04 – 2019-12-09 (×6): 100 mg via ORAL
  Filled 2019-12-04 (×8): qty 1

## 2019-12-04 MED ORDER — ALUM & MAG HYDROXIDE-SIMETH 200-200-20 MG/5ML PO SUSP: 30.0000 mL | ORAL | Status: DC | PRN

## 2019-12-04 MED ORDER — TRAZODONE HCL 50 MG PO TABS
50.0000 mg | ORAL_TABLET | Freq: Every evening | ORAL | Status: DC | PRN
  Administered 2019-12-04 – 2019-12-07 (×4): 50 mg via ORAL
  Filled 2019-12-04 (×4): qty 1

## 2019-12-04 NOTE — [facility] Suicide Risk Assessment (Signed)
[facility] Admission Suicide Risk Assessment   Nursing information obtained from:  Patient Demographic factors:  Male, Unemployed Current Mental Status:  NA Loss Factors:  NA Historical Factors:  NA Risk Reduction Factors:  NA  Total Time spent with patient: 30 minutes Principal Problem: <principal problem not specified> Diagnosis:  Active Problems:   Schizoaffective disorder (HCC)   Psychosis (HCC)  Subjective Data: Patient is seen and examined.  Patient is a 29 year old male with a reported past psychiatric history with schizophrenia who presented to the [facility] on 11/30/2019 with his mother.  His mother was seeking an evaluation.  The patient had been diagnosed with schizophrenia in the past.  Recently he had been living near [place], [place] and had a job, but recently lost it and had been staying "here and there".  The patient's brother lives in [place] as well, and I called the patient's mother to let her know that he was not doing well.  He had apparently not been on medications for quite a while.  He is a poor historian, but stated that he is taking medications in the hospital, but would not take them after discharge.  He stated that there have been some kind of an argument in the home, but would not go into detail.  The mother did tell the evaluation team that he will be fine 1 minute, and then will get angry and "punch walls".  The patient did admit that he had a poor appetite, and was not sleeping well.  He denied any auditory or visual hallucinations.  He denied any gross paranoid thinking.  His last psychiatric hospitalization within our system was in April and 2019.  He was diagnosed with schizophrenia at that time.  Also, at that time he denied all symptoms as he does with me.  Social work in the emergency department at that time received collateral from the mother that stated that his mental state had changed in the last several years.  The family  initially thought it was secondary to alcohol use, but he got into an alcohol treatment center and was told there that there was a primary mental illness as well.  He had been at a [facility] and was on medications, but stopped them.  At that time he had also been going to neighbors houses and arguing with them because he felt as though they were talking about him.  He was hospitalized there for 8 days, and his discharge medications included haloperidol 10 mg p.o. nightly, haloperidol decanoate 100 mg IM q. 28 days and lithium carbonate 300 mg p.o. nightly.  He was admitted to the hospital for evaluation and stabilization.  Continued Clinical Symptoms:  Alcohol Use Disorder Identification Test Final Score (AUDIT): 3 The "Alcohol Use Disorders Identification Test", Guidelines for Use in Primary Care, Second Edition.  World Science writer [facility]). Score between 0-7:  no or low risk or alcohol related problems. Score between 8-15:  moderate risk of alcohol related problems. Score between 16-19:  high risk of alcohol related problems. Score 20 or above:  warrants further diagnostic evaluation for alcohol dependence and treatment.   CLINICAL FACTORS:   Schizophrenia:   Less than 59 years old Paranoid or undifferentiated type   Musculoskeletal: Strength & Muscle Tone: within normal limits Gait & Station: normal Patient leans: N/A  Psychiatric Specialty Exam: Physical Exam Vitals and nursing note reviewed.  Constitutional:      Appearance: Normal appearance.  HENT:  Head: Normocephalic and atraumatic.  Pulmonary:     Effort: Pulmonary effort is normal.  Neurological:     General: No focal deficit present.     Mental Status: He is alert and oriented to person, place, and time.     Review of Systems  All other systems reviewed and are negative.   Blood pressure 110/80, pulse (!) 113, temperature (!) 97.5 F (36.4 C), temperature source Oral, resp. rate 18, height 5\' 11"   (1.803 m), weight 66.7 kg, SpO2 100 %.Body mass index is 20.5 kg/m.  General Appearance: Disheveled  Eye Contact:  Fair  Speech:  Normal Rate  Volume:  Decreased  Mood:  Dysphoric  Affect:  Congruent  Thought Process:  Goal Directed and Descriptions of Associations: Circumstantial  Orientation:  Full (Time, Place, and Person)  Thought Content:  Negative  Suicidal Thoughts:  No  Homicidal Thoughts:  No  Memory:  Immediate;   Poor Recent;   Poor Remote;   Poor  Judgement:  Impaired  Insight:  Lacking  Psychomotor Activity:  Normal  Concentration:  Concentration: Fair and Attention Span: Fair  Recall:  of Knowledge:  Fair  Language:  Fair  Akathisia:  Negative  Handed:  Right  AIMS (if indicated):     Assets:  Desire for Improvement Resilience  ADL's:  Intact  Cognition:  WNL  Sleep:  Number of Hours: 4      COGNITIVE FEATURES THAT CONTRIBUTE TO RISK:  Closed-mindedness    SUICIDE RISK:   Moderate:  Frequent suicidal ideation with limited intensity, and duration, some specificity in terms of plans, no associated intent, good self-control, limited dysphoria/symptomatology, some risk factors present, and identifiable protective factors, including available and accessible social support.  PLAN OF CARE: Patient is seen and examined.  Patient is a 29 year old male with the above-stated past psychiatric history who was admitted secondary to psychosis, agitation, mood swings.  He will be admitted to the hospital.  He will be integrated in the milieu.  He will be encouraged to attend groups.  We will go on and restart his haloperidol.  We will start at 5 mg p.o. daily and 10 mg p.o. nightly.  We will also go on and start his lithium carbonate.  We will start at 300 mg p.o. twice daily and titrate.  There was a lithium level ordered for 10/6, but apparently that was not done.  We will order that on previously drawn blood.  We will also put in place the olanzapine agitation  protocol.  Because of his alcohol we will also put in place lorazepam 1 mg p.o. every 6 hours as needed a CIWA greater than 10.  He will also have available trazodone for sleep.  Review of his admission laboratories revealed normal electrolytes including a creatinine of 1.15.  His AST was 34 and ALT was 20.  Lipid panel was normal.  His CBC showed a mild anemia with a hemoglobin of 12.4 hematocrit of 38.6.  His MCV was decreased at 77.4 and MCH was down to 24.8.  We will order anemia panel including B12 and folate.  We will go on and start B12 and folate as well.  Platelets were normal at 348,000.  His acetaminophen was less than 10, salicylate was 8.4.  Hemoglobin A1c was 4.8.  TSH was normal at 1.542.  His blood alcohol on admission was 219.  Salicylate was 8.4.  Drug screen was negative.  He is mildly tachycardic with a rate of  113.  He is afebrile and his blood pressure stable.  His EKG showed a sinus bradycardia with a QTc interval of 389.  I certify that inpatient services furnished can reasonably be expected to improve the patient's condition.   Antonieta Pert, MD 12/04/2019, 11:18 AM

## 2019-12-04 NOTE — H&P (Signed)
Psychiatric Admission Assessment Adult  Patient Identification: Nathan Barnett. MRN:  [id number] Date of Evaluation:  12/04/2019 Chief Complaint:  Schizoaffective disorder (HCC) [F25.9] Psychosis (HCC) [F29] Principal Diagnosis: <principal problem not specified> Diagnosis:  Active Problems:   Schizoaffective disorder (HCC)   Psychosis (HCC)  History of Present Illness: Patient is seen and examined.  Patient is a 29 year old male with a reported past psychiatric history with schizophrenia who presented to the [facility] on 11/30/2019 with his mother.  His mother was seeking an evaluation.  The patient had been diagnosed with schizophrenia in the past.  Recently he had been living near [place], [place] and had a job, but recently lost it and had been staying "here and there".  The patient's brother lives in [place] as well, and I called the patient's mother to let her know that he was not doing well.  He had apparently not been on medications for quite a while.  He is a poor historian, but stated that he is taking medications in the hospital, but would not take them after discharge.  He stated that there have been some kind of an argument in the home, but would not go into detail.  The mother did tell the evaluation team that he will be fine 1 minute, and then will get angry and "punch walls".  The patient did admit that he had a poor appetite, and was not sleeping well.  He denied any auditory or visual hallucinations.  He denied any gross paranoid thinking.  His last psychiatric hospitalization within our system was in April and 2019.  He was diagnosed with schizophrenia at that time.  Also, at that time he denied all symptoms as he does with me.  Social work in the emergency department at that time received collateral from the mother that stated that his mental state had changed in the last several years.  The family initially thought it was secondary to alcohol  use, but he got into an alcohol treatment center and was told there that there was a primary mental illness as well.  He had been at a [facility] and was on medications, but stopped them.  At that time he had also been going to neighbors houses and arguing with them because he felt as though they were talking about him.  He was hospitalized there for 8 days, and his discharge medications included haloperidol 10 mg p.o. nightly, haloperidol decanoate 100 mg IM q. 28 days and lithium carbonate 300 mg p.o. nightly.  He was admitted to the hospital for evaluation and stabilization.  Associated Signs/Symptoms: Depression Symptoms:  insomnia, disturbed sleep, Duration of Depression Symptoms: No data recorded (Hypo) Manic Symptoms:  Impulsivity, Irritable Mood, Labiality of Mood, Anxiety Symptoms:  Excessive Worry, Psychotic Symptoms:  Delusions, Paranoia, Duration of Psychotic Symptoms: No data recorded PTSD Symptoms: Negative Total Time spent with patient: 45 minutes  Past Psychiatric History: He is a poor historian so it is difficult to get any real details. His last psychiatric hospitalization within our system was in April 2019.  Is the patient at risk to self? No.  Has the patient been a risk to self in the past 6 months? No.  Has the patient been a risk to self within the distant past? No.  Is the patient a risk to others? Yes.    Has the patient been a risk to others in the past 6 months? No.  Has the patient been a risk to  others within the distant past? Yes.     Prior Inpatient Therapy:   Prior Outpatient Therapy:    Alcohol Screening: 1. How often do you have a drink containing alcohol?: Monthly or less 2. How many drinks containing alcohol do you have on a typical day when you are drinking?: 3 or 4 3. How often do you have six or more drinks on one occasion?: Less than monthly AUDIT-C Score: 3 4. How often during the last year have you found that you were not able to  stop drinking once you had started?: Never 5. How often during the last year have you failed to do what was normally expected from you because of drinking?: Never 6. How often during the last year have you needed a first drink in the morning to get yourself going after a heavy drinking session?: Never 7. How often during the last year have you had a feeling of guilt of remorse after drinking?: Never 8. How often during the last year have you been unable to remember what happened the night before because you had been drinking?: Never 9. Have you or someone else been injured as a result of your drinking?: No 10. Has a relative or friend or a doctor or another health worker been concerned about your drinking or suggested you cut down?: No Alcohol Use Disorder Identification Test Final Score (AUDIT): 3 Alcohol Brief Interventions/Follow-up: Alcohol Education Substance Abuse History in the last 12 months:  Yes.   Consequences of Substance Abuse: NA Previous Psychotropic Medications: Yes  Psychological Evaluations: Yes  Past Medical History: History reviewed. No pertinent past medical history. History reviewed. No pertinent surgical history. Family History: History reviewed. No pertinent family history. Family Psychiatric  History: Denied Tobacco Screening: Have you used any form of tobacco in the last 30 days? (Cigarettes, Smokeless Tobacco, Cigars, and/or Pipes): Yes Tobacco use, Select all that apply: 5 or more cigarettes per day Are you interested in Tobacco Cessation Medications?: No, patient refused Counseled patient on smoking cessation including recognizing danger situations, developing coping skills and basic information about quitting provided: Refused/Declined practical counseling Social History:  Social History   Substance and Sexual Activity  Alcohol Use Yes     Social History   Substance and Sexual Activity  Drug Use Not Currently    Additional Social History: Marital status:  Single Are you sexually active?: No What is your sexual orientation?: Heterosexual Does patient have children?: No                         Allergies:  No Known Allergies Lab Results:  Results for orders placed or performed during the hospital encounter of 11/30/19 (from the past 48 hour(s))  Hemoglobin A1c     Status: None   Collection Time: 12/03/19  2:05 PM  Result Value Ref Range   Hgb A1c MFr Bld 4.8 4.8 - 5.6 %    Comment: (NOTE) Pre diabetes:          5.7%-6.4%  Diabetes:              >6.4%  Glycemic control for   <7.0% adults with diabetes    Mean Plasma Glucose 91.06 mg/dL    Comment: Performed at [facility], [address], [place], [place] [address]  Lipid panel     Status: None   Collection Time: 12/03/19  2:05 PM  Result Value Ref Range   Cholesterol 157 0 - 200 mg/dL  Triglycerides 135 <150 mg/dL   HDL 68 >16>40 mg/dL   Total CHOL/HDL Ratio 2.3 RATIO   VLDL 27 0 - 40 mg/dL   LDL Cholesterol 62 0 - 99 mg/dL    Comment:        Total Cholesterol/HDL:CHD Risk Coronary Heart Disease Risk Table                     Men   Women  1/2 Average Risk   3.4   3.3  Average Risk       5.0   4.4  2 X Average Risk   9.6   7.1  3 X Average Risk  23.4   11.0        Use the calculated Patient Ratio above and the CHD Risk Table to determine the patient's CHD Risk.        ATP III CLASSIFICATION (LDL):  <100     mg/dL   Optimal  109-604100-129  mg/dL   Near or Above                    Optimal  130-159  mg/dL   Borderline  [id number]  mg/dL   High  >191>190     mg/dL   Very High Performed at [facility], [address] [place], [place] [address]   TSH     Status: None   Collection Time: 12/03/19  2:05 PM  Result Value Ref Range   TSH 1.542 0.350 - 4.500 uIU/mL    Comment: Performed by a 3rd Generation assay with a functional sensitivity of <=0.01 uIU/mL. Performed at [facility], [address] [place], [place] [address]     Blood Alcohol level:   Lab Results  Component Value Date   ETH 219 (H) 11/30/2019   ETH <10 06/24/2017    Metabolic Disorder Labs:  Lab Results  Component Value Date   HGBA1C 4.8 12/03/2019   MPG 91.06 12/03/2019   MPG 85.32 06/27/2017   No results found for: PROLACTIN Lab Results  Component Value Date   CHOL 157 12/03/2019   TRIG 135 12/03/2019   HDL 68 12/03/2019   CHOLHDL 2.3 12/03/2019   VLDL 27 12/03/2019   LDLCALC 62 12/03/2019   LDLCALC 77 06/27/2017    Current Medications: Current Facility-Administered Medications  Medication Dose Route Frequency Provider Last Rate Last Admin  . acetaminophen (TYLENOL) tablet 650 mg  650 mg Oral Q6H PRN Antonieta Pertlary, Rene Sizelove Lawson, MD      . alum & mag hydroxide-simeth (MAALOX/MYLANTA) 200-200-20 MG/5ML suspension 30 mL  30 mL Oral Q4H PRN Antonieta Pertlary, Ishitha Roper Lawson, MD      . folic acid (FOLVITE) tablet 1 mg  1 mg Oral Daily Antonieta Pertlary, Gregroy Dombkowski Lawson, MD      . haloperidol (HALDOL) tablet 10 mg  10 mg Oral QHS Antonieta Pertlary, Khallid Pasillas Lawson, MD      . haloperidol (HALDOL) tablet 5 mg  5 mg Oral Daily Antonieta Pertlary, Kaydie Petsch Lawson, MD   5 mg at 12/04/19 [id number]  . hydrOXYzine (ATARAX/VISTARIL) tablet 25 mg  25 mg Oral TID PRN Antonieta Pertlary, Mirielle Byrum Lawson, MD      . Melene Muller[START ON 12/05/2019] lithium carbonate capsule 300 mg  300 mg Oral QHS Antonieta Pertlary, Linden Tagliaferro Lawson, MD      . OLANZapine zydis (ZYPREXA) disintegrating tablet 10 mg  10 mg Oral Q8H PRN Antonieta Pertlary, Emonee Winkowski Lawson, MD       And  . LORazepam (ATIVAN) tablet 1  mg  1 mg Oral Q6H PRN Antonieta Pert, MD       And  . ziprasidone (GEODON) injection 20 mg  20 mg Intramuscular Q6H PRN Antonieta Pert, MD      . LORazepam (ATIVAN) tablet 1 mg  1 mg Oral Q6H PRN Antonieta Pert, MD      . magnesium hydroxide (MILK OF MAGNESIA) suspension 30 mL  30 mL Oral Daily PRN Antonieta Pert, MD      . thiamine tablet 100 mg  100 mg Oral Daily Antonieta Pert, MD      . traZODone (DESYREL) tablet 50 mg  50 mg Oral QHS PRN Antonieta Pert, MD       PTA  Medications: Medications Prior to Admission  Medication Sig Dispense Refill Last Dose  . haloperidol (HALDOL) 10 MG tablet Take 1 tablet (10 mg total) by mouth at bedtime. (Patient not taking: Reported on 12/01/2019) 30 tablet 0   . haloperidol decanoate (HALDOL DECANOATE) 100 MG/ML injection Inject 1 mL (100 mg total) into the muscle every 28 (twenty-eight) days. (Patient not taking: Reported on 12/01/2019) 1 mL    . lithium carbonate (LITHOBID) 300 MG CR tablet Take 1 tablet (300 mg total) by mouth at bedtime. (Patient not taking: Reported on 12/01/2019) 30 tablet 0     Musculoskeletal: Strength & Muscle Tone: within normal limits Gait & Station: normal Patient leans: N/A  Psychiatric Specialty Exam: Physical Exam Vitals and nursing note reviewed.  HENT:     Head: Normocephalic and atraumatic.  Pulmonary:     Effort: Pulmonary effort is normal.  Neurological:     General: No focal deficit present.     Mental Status: He is alert.     Review of Systems  Blood pressure 110/80, pulse (!) 113, temperature (!) 97.5 F (36.4 C), temperature source Oral, resp. rate 18, height 5\' 11"  (1.803 m), weight 66.7 kg, SpO2 100 %.Body mass index is 20.5 kg/m.  General Appearance: Disheveled  Eye Contact:  Fair  Speech:  Normal Rate  Volume:  Decreased  Mood:  Dysphoric  Affect:  Congruent  Thought Process:  Goal Directed and Descriptions of Associations: Circumstantial  Orientation:  Full (Time, Place, and Person)  Thought Content:  Negative  Suicidal Thoughts:  No  Homicidal Thoughts:  No  Memory:  Immediate;   Poor Recent;   Poor Remote;   Poor  Judgement:  Impaired  Insight:  Lacking  Psychomotor Activity:  Normal  Concentration:  Concentration: Fair  Recall:  of Knowledge:  Fair  Language:  Fair  Akathisia:  Negative  Handed:  Right  AIMS (if indicated):     Assets:  Desire for Improvement Resilience  ADL's:  Intact  Cognition:  WNL  Sleep:  Number of Hours: 4     Treatment Plan Summary: Daily contact with patient to assess and evaluate symptoms and progress in treatment, Medication management and Plan : Patient is seen and examined.  Patient is a 29 year old male with the above-stated past psychiatric history who was admitted secondary to psychosis, agitation, mood swings.  He will be admitted to the hospital.  He will be integrated in the milieu.  He will be encouraged to attend groups.  We will go on and restart his haloperidol.  We will start at 5 mg p.o. daily and 10 mg p.o. nightly.  We will also go on and start his lithium carbonate.  We will start at 300 mg  p.o. twice daily and titrate.  There was a lithium level ordered for 10/6, but apparently that was not done.  We will order that on previously drawn blood.  We will also put in place the olanzapine agitation protocol.  Because of his alcohol we will also put in place lorazepam 1 mg p.o. every 6 hours as needed a CIWA greater than 10.  He will also have available trazodone for sleep.  Review of his admission laboratories revealed normal electrolytes including a creatinine of 1.15.  His AST was 34 and ALT was 20.  Lipid panel was normal.  His CBC showed a mild anemia with a hemoglobin of 12.4 hematocrit of 38.6.  His MCV was decreased at 77.4 and MCH was down to 24.8.  We will order anemia panel including B12 and folate.  We will go on and start B12 and folate as well.  Platelets were normal at 348,000.  His acetaminophen was less than 10, salicylate was 8.4.  Hemoglobin A1c was 4.8.  TSH was normal at 1.542.  His blood alcohol on admission was 219.  Salicylate was 8.4.  Drug screen was negative.  He is mildly tachycardic with a rate of 113.  He is afebrile and his blood pressure stable.  His EKG showed a sinus bradycardia with a QTc interval of 389.  Observation Level/Precautions:  15 minute checks  Laboratory:  Chemistry Profile  Psychotherapy:    Medications:    Consultations:    Discharge Concerns:     Estimated LOS:  Other:     Physician Treatment Plan for Primary Diagnosis: <principal problem not specified> Long Term Goal(s): Improvement in symptoms so as ready for discharge  Short Term Goals: Ability to identify changes in lifestyle to reduce recurrence of condition will improve, Ability to verbalize feelings will improve, Ability to demonstrate self-control will improve, Ability to identify and develop effective coping behaviors will improve, Ability to maintain clinical measurements within normal limits will improve, Compliance with prescribed medications will improve and Ability to identify triggers associated with substance abuse/mental health issues will improve  Physician Treatment Plan for Secondary Diagnosis: Active Problems:   Schizoaffective disorder (HCC)   Psychosis (HCC)  Long Term Goal(s): Improvement in symptoms so as ready for discharge  Short Term Goals: Ability to identify changes in lifestyle to reduce recurrence of condition will improve, Ability to verbalize feelings will improve, Ability to demonstrate self-control will improve, Ability to identify and develop effective coping behaviors will improve, Ability to maintain clinical measurements within normal limits will improve, Compliance with prescribed medications will improve and Ability to identify triggers associated with substance abuse/mental health issues will improve  I certify that inpatient services furnished can reasonably be expected to improve the patient's condition.    Antonieta Pert, MD 10/6/20212:34 PM

## 2019-12-04 NOTE — [facility] Counselor (Signed)
CSW attempted assessment. Pt asleep and did not respond to verbal queues. Will attempt later.

## 2019-12-04 NOTE — [facility] Suicide Risk Assessment (Signed)
[facility] INPATIENT:  Family/Significant Other Suicide Prevention Education  Suicide Prevention Education:  Education Completed; Nathan Barnett (Mother)  [phone] [facility] Phone),  (name of family member/significant other) has been identified by the patient as the family member/significant other with whom the patient will be residing, and identified as the person(s) who will aid the patient in the event of a mental health crisis (suicidal ideations/suicide attempt).  With written consent from the patient, the family member/significant other has been provided the following suicide prevention education, prior to the and/or following the discharge of the patient.  The suicide prevention education provided includes the following:  Suicide risk factors  Suicide prevention and interventions  National Suicide Hotline telephone number  [facility] assessment telephone number  [facility] Emergency Assistance 911  [facility] and/or Residential Mobile Crisis Unit telephone number  Request made of family/significant other to:  Remove weapons (e.g., guns, rifles, knives), all items previously/currently identified as safety concern.    Remove drugs/medications (over-the-counter, prescriptions, illicit drugs), all items previously/currently identified as a safety concern.  The family member/significant other verbalizes understanding of the suicide prevention education information provided.  The family member/significant other agrees to remove the items of safety concern listed above.  Mom states that pt is living with herself and her husband. She states husband has a gun but that it is locked mom. Mom explains that pt was at [facility] about 3 years ago for 2 months. She stated he did well with a specific medication but that he did not continue it since and has been unable to hold a job. She was not happy with the medication he was on when at [facility] stating he was too sedated.  Pt mother recognizes that pt is still unwilling to take medication but states that pt just spoke with her over the phone and was willing for counseling. Mother explains she does not want [facility] and is going to seek therapists that she could private pay. Mom will call CSW when she finds a therapist and remembers medication that pt was on at [place]. Mother confirms that pt would be staying with her post-discharge.   Erin Sons 12/04/2019, 3:09 PM

## 2019-12-04 NOTE — Progress Notes (Signed)
Patient ID: Nathan Barnett., male   DOB: 22-Feb-1991, 29 y.o.   MRN: [id number]  Admission Note:  29 yr male who presents IVC in no acute distress for the treatment of non-compliance of medications and psychosis. Pt irritable, sarcastic and condescending during admission. Writer tried to explain information pt state"I've been to places like this , I know how it works". Writer tride to explain that our process may be a little different, but pt stated he knew everything , so he did not want writer to explain a lot of the information. Pt did curse at writer"FUCK YOU" during the admission. Writer tried to explain we were here to help him, but pt appeared not ready to accept the information at this time. Pt was very minimal with his answers and forwarded little information.  Per [facility] ASSESSMENT: diagnosed with schizophrenia in the past.  He has been living in [place] and was working for [employer], but states that he lost his job and he has been staying "here and there."  Mother states that patient has not been able to get a job because he does not present well due to his mental illness being unstable.  Mother states that patient's brother lives in [place] and he called patient's mother to let her know that patient was not doing well.  She states that patient has not been medications in a good while.  Patient states that he does not want to be on medications.  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  He states that one side is nice and cooperative whereas the otherone causes him to be erratic and screaming and mother states, "punching walls."  Patient states that he has been admitted to mental health facilities in the past, but states thjat he was not prescribed any medication.  Mother states that patient does not want to take his medication and subsequently falls apart when he  is not taking them.  Patient denies any current SI/HI/AVH and states that he does nto use any alcohol or drugs. Patient states that he has not been eating or sleeping well.  Mother states that patient had a mental break when he was attending college at [employer], but was able to finish a degree through [employer].  She states that patient has been hospitalized in the past for his mental health issues, with his last hospitalization at [facility].  Patient is generally non-compliant with his medications and she states that he used to drink some by history, but states that the alcohol only exacerbated his mental illness.  She states that patient has never been in a lot of trouble, but was arrested on one occasion for resisting an Technical sales engineer and being a public nuisance, creating a public disturbance while he was in a mental health crisis.  Patient is essentially homeless and lives out of a car.  She states that he has been decompensating, so she brought him home in order to try yo get some help for him.  She states that he has made statements that he wishes he was dead, he has been carrying on conversations with people who are not there and has been punching walls and yelling.  Mother states that she has been scared of him at times.  She states that he cannot return to her home.   A:Skin was assessed and found to be clear of any abnormal marks.  PT searched and no contraband found, POC and unit policies explained and understanding verbalized. Consents obtained. Food and fluids offered, and  accepted.  R: Pt had no additional questions or concerns.

## 2019-12-04 NOTE — [facility] Counselor (Signed)
Adult Comprehensive Assessment  Patient ID: Nathan Barnett., male   DOB: [birth year], 29 y.o.   MRN: [id number]  Information Source: Information source: Patient (pt records)  Current Stressors:  Patient states their primary concerns and needs for treatment are:: "Regroup in my head" When CSW asks for pt to elaborate pt states "just life things" Patient states their goals for this hospitilization and ongoing recovery are:: "try to stay positive" Educational / Learning stressors: denies. Graduated from [employer] in 2017 with business degree Employment / Job issues: unemployed. States "I'll be working one day" Family Relationships: Pt reports mother. Doesn't share about aditional family Financial / Lack of resources (include bankruptcy): pt reports  not having enough money Housing / Lack of housing: Lives with mother. Does not know if he will return there "My mom might set me up somewhere else" Physical health (include injuries & life threatening diseases): denies any probems Social relationships: does not report sny social relationships Substance abuse: reports drinking a few times per month. Reports he drinks a few cans of beer. Does not report any other use Bereavement / Loss: UTA  Living/Environment/Situation:  Living Arrangements: Parent Living conditions (as described by patient or guardian): Good Who else lives in the home?: Mom How long has patient lived in current situation?: "I just got back there from [place]" What is atmosphere in current home: Other (Comment) ("i don't care for it")  Family History:  Marital status: Single Are you sexually active?: No What is your sexual orientation?: Heterosexual Does patient have children?: No  Childhood History:  By whom was/is the patient raised?: Both parents Additional childhood history information: Parents divorced, patient lived with mother initially, then went to live with his father Description of patient's relationship with  caregiver when they were a child: Close to his mother, but father was verbally abusive How were you disciplined when you got in trouble as a child/adolescent?: "I didnt need any of that, "  Mother states that patient never got into trouble Does patient have siblings?: Yes Description of patient's current relationship with siblings: "Fine" Did patient suffer any verbal/emotional/physical/sexual abuse as a child?: No Did patient suffer from severe childhood neglect?: No Has patient ever been sexually abused/assaulted/raped as an adolescent or adult?: No Was the patient ever a victim of a crime or a disaster?: No Witnessed domestic violence?: No Has patient been affected by domestic violence as an adult?: No  Education:  Highest grade of school patient has completed: Chief Operating Officer degree Currently a Consulting civil engineer?: No Learning disability?: No  Employment/Work Situation:   Employment situation: Unemployed Patient's job has been impacted by current illness: No What is the longest time patient has a held a job?: 1 years Where was the patient employed at that time?: Therapist, nutritional Has patient ever been in the [employer]?: No  Financial Resources:   Surveyor, quantity resources: No income, Support from parents / caregiver Does patient have a Lawyer or guardian?: No  Alcohol/Substance Abuse:   What has been your use of drugs/alcohol within the last 12 months?: Occasional alcohol use "few time a month" "a few cans of beer" If attempted suicide, did drugs/alcohol play a role in this?: No Alcohol/Substance Abuse Treatment Hx: Denies past history Has alcohol/substance abuse ever caused legal problems?: No  Social Support System:   Forensic psychologist System: None Type of faith/religion: Atheist  Leisure/Recreation:   Do You Have Hobbies?: No  Strengths/Needs:   What is the patient's perception of their strengths?: UTA Patient states  these barriers may affect/interfere with  their treatment: none Patient states these barriers may affect their return to the community: none  Discharge Plan:   Currently receiving community mental health services: No Patient states concerns and preferences for aftercare planning are: Pt states he does not want any services Does patient have access to transportation?: Yes (states he has a car) Does patient have financial barriers related to discharge medications?: Yes Patient description of barriers related to discharge medications: Pt has no insurance. Will need samples and referral to uninsured clinic Plan for living situation after discharge: Pt states he is unsure if he can go to his mothers stating "she might set me up somewhere" Will patient be returning to same living situation after discharge?: No  Summary/Recommendations:   From ED Assessment: Patient presented to [facility] with his mother, Tana Conch,  seeking an evaluation. .Patient has been diagnosed with schizophrenia in the past.  He has been living in [place] and was working for [employer], but states that he lost his job and he has been staying "here and there."  Mother states that patient has not been able to get a job because he does not present well due to his mental illness being unstable.  Mother states that patient's brother lives in [place] and he called patient's mother to let her know that patient was not doing well.  She states that patient has not been medications in a good while.  Patient states that he does not want to be on medications.  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  He states that one side is nice and cooperative whereas the otherone causes him to be erratic and screaming and mother states, "punching walls."  Patient states that he has been admitted to mental health facilities in the past, but states thjat he was not  prescribed any medication.  Mother states that patient does not want to take his medication and subsequently falls apart when he is not taking them.   Pt is guarded and irritable during  CSW [facility] assessment. He gives short, vague responses to most questions. Pt states he lives with his mother in [place] since coming back from [place]. Pt is vague about his current stressors and only explains he needs to "regroup in my head." Pt states he has not recieved mental health services in the past and he does not want to establish any services. When asked if pt plans to return home with his mother he explains "she might set me up somewhere else" Pt is willing to sign consent for his mother.  Patient will benefit from crisis stabilization, medication evaluation, group therapy and psychoeducation, in addition to case management for discharge planning.  Fergie Sherbert P Aleria Maheu. 12/04/2019

## 2019-12-04 NOTE — Progress Notes (Signed)
Pt had minimal interaction, forwards little information, visibly angry    12/04/19 2200  Psych Admission Type (Psych Patients Only)  Admission Status Involuntary  Psychosocial Assessment  Patient Complaints Anxiety;Agitation  Eye Contact Fair  Facial Expression Angry;Animated;Anxious;Pensive  Affect Angry;Anxious;Irritable;Preoccupied  Speech Aggressive;Pressured  Interaction Cautious;Forwards little;Guarded  Motor Activity Fidgety;Posturing;Restless  Appearance/Hygiene Disheveled  Behavior Characteristics Cooperative  Mood Anxious;Angry  Thought Process  Coherency Concrete thinking  Content Blaming others;Preoccupation  Delusions Controlled;Persecutory  Perception Depersonalization  Hallucination None reported or observed  Judgment Poor  Confusion None  Danger to Self  Current suicidal ideation? Denies  Danger to Others  Danger to Others None reported or observed

## 2019-12-04 NOTE — Tx Team (Signed)
Initial Treatment Plan 12/04/2019 12:12 AM [name] HTX:[id number]    PATIENT STRESSORS: Marital or family conflict Medication change or noncompliance   PATIENT STRENGTHS: General fund of knowledge Motivation for treatment/growth   PATIENT IDENTIFIED PROBLEMS: psychosis  anger  "no"                 DISCHARGE CRITERIA:  Improved stabilization in mood, thinking, and/or behavior Verbal commitment to aftercare and medication compliance  PRELIMINARY DISCHARGE PLAN: Attend PHP/IOP Outpatient therapy  PATIENT/FAMILY INVOLVEMENT: This treatment plan has been presented to and reviewed with the patient, Keldon Lassen..  The patient and family have been given the opportunity to ask questions and make suggestions.  Delos Haring, RN 12/04/2019, 12:12 AM

## 2019-12-04 NOTE — Plan of Care (Signed)
Progress note  D: pt found in bed; compliant with medication administration. Pt presents anxious, agitated, fidgety, posturing, and with angry affect. With initial assessment the pt states "some bullshit" brought them to the hospital. Pt stated they couldn't go back to where they were living, but stated they had a place to be discharged. Pt was minimal with their assessment. Pt denied any physical complaints or pain. Pt continues to be reclusive to their room and hasn't been viewed in the milieu often. Pt denies si/hi/ah/vh and verbally agrees to approach staff if these become apparent or before harming themself/others while at [facility]hh.  A: Pt provided support and encouragement. Pt given medication per protocol and standing orders. Q42m safety checks implemented and continued.  R: Pt safe on the unit. Will continue to monitor.  Pt progressing in the following metrics  Problem: Activity: Goal: Will verbalize the importance of balancing activity with adequate rest periods Outcome: Progressing   Problem: Education: Goal: Knowledge of the prescribed therapeutic regimen will improve Outcome: Progressing   Problem: Health Behavior/Discharge Planning: Goal: Compliance with prescribed medication regimen will improve Outcome: Progressing

## 2019-12-04 NOTE — Progress Notes (Signed)
After pt brought in the room pt appeared very paranoid of roommate and very watchful

## 2019-12-04 NOTE — [facility] Group Notes (Signed)
[facility] LCSW Group Therapy  12/04/2019 2:30 PM  Type of Therapy:  Discharge Planning  Participation Level:  Did Not Attend  Participation Quality:  Did not attend  Affect:  Did not attend  Cognitive:  Did not attend  Insight:  Did not attend  Engagement in Therapy:  Did not attend  Modes of Intervention:  Did not attend  Summary of Progress/Problems: This patient was invited to attend group by CSW, however declined attendance.  Char Feltman A Charli Liberatore 12/04/2019, 2:30 PM

## 2019-12-04 NOTE — Tx Team (Signed)
Interdisciplinary Treatment and Diagnostic Plan Update  12/04/2019 Time of Session: [place] Nathan Barnett. MRN: [id number]  Principal Diagnosis: <principal problem not specified>  Secondary Diagnoses: Active Problems:   Schizoaffective disorder ([place])   Psychosis ([place])   Current Medications:  Current Facility-Administered Medications  Medication Dose Route Frequency Provider Last Rate Last Admin  . acetaminophen (TYLENOL) tablet 650 mg  650 mg Oral Q6H PRN Nathan Covert, MD      . alum & mag hydroxide-simeth (MAALOX/MYLANTA) 200-200-20 MG/5ML suspension 30 mL  30 mL Oral Q4H PRN Nathan Covert, MD      . haloperidol (HALDOL) tablet 10 mg  10 mg Oral QHS Nathan Covert, MD      . haloperidol (HALDOL) tablet 5 mg  5 mg Oral Daily Nathan Covert, MD   5 mg at 12/04/19 6063  . hydrOXYzine (ATARAX/VISTARIL) tablet 25 mg  25 mg Oral TID PRN Nathan Covert, MD      . lithium carbonate capsule 300 mg  300 mg Oral BID WC Nathan Covert, MD   300 mg at 12/04/19 0160  . OLANZapine zydis (ZYPREXA) disintegrating tablet 10 mg  10 mg Oral Q8H PRN Nathan Covert, MD       And  . LORazepam (ATIVAN) tablet 1 mg  1 mg Oral Q6H PRN Nathan Covert, MD       And  . ziprasidone (GEODON) injection 20 mg  20 mg Intramuscular Q6H PRN Nathan Covert, MD      . magnesium hydroxide (MILK OF MAGNESIA) suspension 30 mL  30 mL Oral Daily PRN Nathan Covert, MD      . traZODone (DESYREL) tablet 50 mg  50 mg Oral QHS PRN Nathan Covert, MD       PTA Medications: Medications Prior to Admission  Medication Sig Dispense Refill Last Dose  . haloperidol (HALDOL) 10 MG tablet Take 1 tablet (10 mg total) by mouth at bedtime. (Patient not taking: Reported on 12/01/2019) 30 tablet 0   . haloperidol decanoate (HALDOL DECANOATE) 100 MG/ML injection Inject 1 mL (100 mg total) into the muscle every 28 (twenty-eight) days. (Patient not taking: Reported on 12/01/2019) 1 mL    .  lithium carbonate (LITHOBID) 300 MG CR tablet Take 1 tablet (300 mg total) by mouth at bedtime. (Patient not taking: Reported on 12/01/2019) 30 tablet 0     Patient Stressors: Marital or family conflict Medication change or noncompliance  Patient Strengths: Technical sales engineer for treatment/growth  Treatment Modalities: Medication Management, Group therapy, Case management,  1 to 1 session with clinician, Psychoeducation, Recreational therapy.   Physician Treatment Plan for Primary Diagnosis: <principal problem not specified> Long Term Goal(s):     Short Term Goals:    Medication Management: Evaluate patient's response, side effects, and tolerance of medication regimen.  Therapeutic Interventions: 1 to 1 sessions, Unit Group sessions and Medication administration.  Evaluation of Outcomes: Not Met  Physician Treatment Plan for Secondary Diagnosis: Active Problems:   Schizoaffective disorder ([place])   Psychosis ([place])  Long Term Goal(s):     Short Term Goals:       Medication Management: Evaluate patient's response, side effects, and tolerance of medication regimen.  Therapeutic Interventions: 1 to 1 sessions, Unit Group sessions and Medication administration.  Evaluation of Outcomes: Not Met   RN Treatment Plan for Primary Diagnosis: <principal problem not specified> Long Term Goal(s): Knowledge of disease and therapeutic regimen to maintain health  will improve  Short Term Goals: Ability to verbalize frustration and anger appropriately will improve, Ability to demonstrate self-control, Ability to participate in decision making will improve and Compliance with prescribed medications will improve  Medication Management: RN will administer medications as ordered by provider, will assess and evaluate patient's response and provide education to patient for prescribed medication. RN will report any adverse and/or side effects to prescribing provider.  Therapeutic  Interventions: 1 on 1 counseling sessions, Psychoeducation, Medication administration, Evaluate responses to treatment, Monitor vital signs and CBGs as ordered, Perform/monitor CIWA, COWS, AIMS and Fall Risk screenings as ordered, Perform wound care treatments as ordered.  Evaluation of Outcomes: Not Met   LCSW Treatment Plan for Primary Diagnosis: <principal problem not specified> Long Term Goal(s): Safe transition to appropriate next level of care at discharge, Engage patient in therapeutic group addressing interpersonal concerns.  Short Term Goals: Engage patient in aftercare planning with referrals and resources, Increase ability to appropriately verbalize feelings, Increase emotional regulation and Increase skills for wellness and recovery  Therapeutic Interventions: Assess for all discharge needs, 1 to 1 time with Social worker, Explore available resources and support systems, Assess for adequacy in community support network, Educate family and significant other(s) on suicide prevention, Complete Psychosocial Assessment, Interpersonal group therapy.  Evaluation of Outcomes: Not Met   Progress in Treatment: Attending groups: No. Participating in groups: No. and As evidenced by:  just recently admitted.  Taking medication as prescribed: Yes. Toleration medication: Yes. Family/Significant other contact made: No, will contact:  SW to discuss with Pt during assessment. Patient understands diagnosis: Yes. Discussing patient identified problems/goals with staff: Yes. Medical problems stabilized or resolved: Yes. Denies suicidal/homicidal ideation: Yes. Issues/concerns per patient self-inventory: No. Other: None  New problem(s) identified: Yes, Describe:  Pt addmited for symptoms assosciated with current diagnosis of schizophrenia. He admits to recent med non-compliance.  New Short Term/Long Term Goal(s): SW will meet with Pt for completion of psychosocial assessment. SW will encourage  consent for collateral contact during hospital admission. Pt encourage to attend groups to learn new and transferable coping skills.  Patient Goals:  "Try to get some sleep while I'm here."  Discharge Plan or Barriers: SW will continue to assess.   Reason for Continuation of Hospitalization: Anxiety Delusions  Medication stabilization  Estimated Length of Stay: 3-5 Days  Attendees: Patient: Nathan Barnett. 12/04/2019 11:20 AM  Physician: Dr. Myles Lipps 12/04/2019 11:20 AM  Nursing: Venetia Constable, RN 12/04/2019 11:20 AM  RN Care Manager: 12/04/2019 11:20 AM  Social Worker: Freddi Che, LCSW 12/04/2019 11:20 AM  Recreational Therapist:  12/04/2019 11:20 AM  Other:  12/04/2019 11:20 AM  Other:  12/04/2019 11:20 AM  Other: 12/04/2019 11:20 AM    Scribe for Treatment Team: Freddi Che, LCSW 12/04/2019 11:20 AM

## 2019-12-04 NOTE — [facility] Group Notes (Signed)
Patient did not attend orientation group.  

## 2019-12-05 LAB — PROLACTIN: Prolactin: 25.4 ng/mL — ABNORMAL HIGH (ref 4.0–15.2)

## 2019-12-05 MED ORDER — BENZTROPINE MESYLATE 1 MG/ML IJ SOLN
INTRAMUSCULAR | Status: AC
  Administered 2019-12-05: 1 mg via INTRAMUSCULAR
  Filled 2019-12-05: qty 2

## 2019-12-05 MED ORDER — BENZTROPINE MESYLATE 0.5 MG PO TABS
0.5000 mg | ORAL_TABLET | Freq: Two times a day (BID) | ORAL | Status: DC
  Administered 2019-12-05: 0.5 mg via ORAL
  Filled 2019-12-05 (×4): qty 1

## 2019-12-05 MED ORDER — BENZTROPINE MESYLATE 1 MG/ML IJ SOLN
1.0000 mg | Freq: Once | INTRAMUSCULAR | Status: AC
  Filled 2019-12-05: qty 1

## 2019-12-05 MED ORDER — HALOPERIDOL DECANOATE 100 MG/ML IM SOLN
100.0000 mg | Freq: Once | INTRAMUSCULAR | Status: AC
  Administered 2019-12-05: 100 mg via INTRAMUSCULAR
  Filled 2019-12-05: qty 1

## 2019-12-05 MED ORDER — BENZTROPINE MESYLATE 1 MG PO TABS
1.0000 mg | ORAL_TABLET | Freq: Two times a day (BID) | ORAL | Status: DC
  Administered 2019-12-06 – 2019-12-09 (×7): 1 mg via ORAL
  Filled 2019-12-05: qty 14
  Filled 2019-12-05 (×9): qty 1
  Filled 2019-12-05: qty 14
  Filled 2019-12-05: qty 1

## 2019-12-05 NOTE — Progress Notes (Signed)
Pt seen pacing the unit some this evening     12/05/19 2000  Psych Admission Type (Psych Patients Only)  Admission Status Involuntary  Psychosocial Assessment  Patient Complaints Anxiety;Restlessness  Eye Contact Brief  Facial Expression Angry;Anxious  Affect Angry;Anxious;Irritable;Preoccupied  Speech Aggressive;Pressured  Interaction Cautious;Guarded;Evasive  Motor Activity Fidgety  Appearance/Hygiene Disheveled  Mood Anxious;Suspicious;Preoccupied  Thought Administrator, sports thinking  Content Preoccupation;Blaming others  Delusions Persecutory  Perception UTA  Hallucination None reported or observed  Judgment UTA  Confusion None  Danger to Self  Current suicidal ideation? Denies  Danger to Others  Danger to Others None reported or observed

## 2019-12-05 NOTE — Progress Notes (Signed)
Progress note  Pt reports eps symptoms. 1x order for cogentin IM was obtained from MD clary. Cogentin BID was increased to 1 mg. Medication provided. Pt resting now. Will continue to monitor.

## 2019-12-05 NOTE — Progress Notes (Signed)
D: Patient presents with irritable affect. When addressed by the RN patient responded angrily and rapidly. Patient denies SI/HI at this time. Patient also denies AH/VH at this time. Patient aware to notify staff or RN if this changes.  A: Provided positive reinforcement and encouragement.  R: Patient needs more reinforcement and encouragement.  [place] NOVEL CORONAVIRUS (COVID-19) DAILY CHECK-OFF SYMPTOMS - answer yes or no to each - every day NO YES  Have you had a fever in the past 24 hours?   Fever (Temp > 37.80C / 100F) X    Have you had any of these symptoms in the past 24 hours?  New Cough   Sore Throat    Shortness of Breath   Difficulty Breathing   Unexplained Body Aches   X    Have you had any one of these symptoms in the past 24 hours not related to allergies?    Runny Nose   Nasal Congestion   Sneezing   X    If you have had runny nose, nasal congestion, sneezing in the past 24 hours, has it worsened?   X    EXPOSURES - check yes or no X    Have you traveled outside the state in the past 14 days?   X    Have you been in contact with someone with a confirmed diagnosis of COVID-19 or PUI in the past 14 days without wearing appropriate PPE?   X    Have you been living in the same home as a person with confirmed diagnosis of COVID-19 or a PUI (household contact)?     X    Have you been diagnosed with COVID-19?     X                                                                                                                             What to do next: Answered NO to all: Answered YES to anything:    Proceed with unit schedule Follow the [facility] Inpatient Flowsheet.

## 2019-12-05 NOTE — Progress Notes (Signed)
[facility] MD Progress Note  12/05/2019 1:47 PM Nathan Barnett.  MRN:  [id number] Subjective: Patient is a 29 year old male with a reported past psychiatric history significant for schizophrenia who was admitted on 12/04/2019 secondary to noncompliance with medication, worsening paranoia, irritability and violent outburst.  Objective: Patient is seen and examined.  Patient is a 29 year old male with the above-stated past psychiatric history who is seen in follow-up.  He is much more irritable today.  His only question and only answer is "when am I going home".  He denied all other symptoms of illness.  His vital signs are stable, he is afebrile.  He slept 8.75 hours last night.  Review of his laboratories revealed vitamin B12 of 471.  His iron panel was essentially normal.  Lithium level was 0.16.  Prolactin was 25.4.  TSH was 1.542.  As stated previously, his blood alcohol on 10/2 was 219.  Principal Problem: <principal problem not specified> Diagnosis: Active Problems:   Schizoaffective disorder (HCC)   Psychosis (HCC)  Total Time spent with patient: 20 minutes  Past Psychiatric History: See admission H&P  Past Medical History: History reviewed. No pertinent past medical history. History reviewed. No pertinent surgical history. Family History: History reviewed. No pertinent family history. Family Psychiatric  History: See admission H&P Social History:  Social History   Substance and Sexual Activity  Alcohol Use Yes     Social History   Substance and Sexual Activity  Drug Use Not Currently    Social History   Socioeconomic History  . Marital status: Single    Spouse name: Not on file  . Number of children: Not on file  . Years of education: Not on file  . Highest education level: Not on file  Occupational History  . Not on file  Tobacco Use  . Smoking status: Current Some Day Smoker    Packs/day: 1.00    Years: 2.00    Pack years: 2.00    Types: Cigarettes  . Smokeless  tobacco: Never Used  Vaping Use  . Vaping Use: Never used  Substance and Sexual Activity  . Alcohol use: Yes  . Drug use: Not Currently  . Sexual activity: Not Currently  Other Topics Concern  . Not on file  Social History Narrative  . Not on file   Social Determinants of Health   Financial Resource Strain:   . Difficulty of Paying Living Expenses: Not on file  Food Insecurity:   . Worried About Programme researcher, broadcasting/film/video in the Last Year: Not on file  . Ran Out of Food in the Last Year: Not on file  Transportation Needs:   . Lack of Transportation (Medical): Not on file  . Lack of Transportation (Non-Medical): Not on file  Physical Activity:   . Days of Exercise per Week: Not on file  . Minutes of Exercise per Session: Not on file  Stress:   . Feeling of Stress : Not on file  Social Connections:   . Frequency of Communication with Friends and Family: Not on file  . Frequency of Social Gatherings with Friends and Family: Not on file  . Attends Religious Services: Not on file  . Active Member of Clubs or Organizations: Not on file  . Attends Banker Meetings: Not on file  . Marital Status: Not on file   Additional Social History:  Sleep: Good  Appetite:  Good  Current Medications: Current Facility-Administered Medications  Medication Dose Route Frequency Provider Last Rate Last Admin  . acetaminophen (TYLENOL) tablet 650 mg  650 mg Oral Q6H PRN Antonieta Pert, MD      . alum & mag hydroxide-simeth (MAALOX/MYLANTA) 200-200-20 MG/5ML suspension 30 mL  30 mL Oral Q4H PRN Antonieta Pert, MD      . folic acid (FOLVITE) tablet 1 mg  1 mg Oral Daily Antonieta Pert, MD   1 mg at 12/05/19 1221  . haloperidol (HALDOL) tablet 10 mg  10 mg Oral QHS Antonieta Pert, MD   10 mg at 12/04/19 2009  . haloperidol (HALDOL) tablet 5 mg  5 mg Oral Daily Antonieta Pert, MD   5 mg at 12/05/19 1221  . hydrOXYzine (ATARAX/VISTARIL)  tablet 25 mg  25 mg Oral TID PRN Antonieta Pert, MD      . lithium carbonate capsule 300 mg  300 mg Oral QHS Antonieta Pert, MD      . OLANZapine zydis (ZYPREXA) disintegrating tablet 10 mg  10 mg Oral Q8H PRN Antonieta Pert, MD       And  . LORazepam (ATIVAN) tablet 1 mg  1 mg Oral Q6H PRN Antonieta Pert, MD       And  . ziprasidone (GEODON) injection 20 mg  20 mg Intramuscular Q6H PRN Antonieta Pert, MD      . LORazepam (ATIVAN) tablet 1 mg  1 mg Oral Q6H PRN Antonieta Pert, MD      . magnesium hydroxide (MILK OF MAGNESIA) suspension 30 mL  30 mL Oral Daily PRN Antonieta Pert, MD      . thiamine tablet 100 mg  100 mg Oral Daily Antonieta Pert, MD   100 mg at 12/05/19 1221  . traZODone (DESYREL) tablet 50 mg  50 mg Oral QHS PRN Antonieta Pert, MD   50 mg at 12/04/19 2009    Lab Results:  Results for orders placed or performed during the hospital encounter of 12/03/19 (from the past 48 hour(s))  Lithium level     Status: Abnormal   Collection Time: 12/04/19  6:11 PM  Result Value Ref Range   Lithium Lvl 0.16 (L) 0.60 - 1.20 mmol/L    Comment: Performed at [facility], [address] [place], [place] [address]  Prolactin     Status: Abnormal   Collection Time: 12/04/19  6:11 PM  Result Value Ref Range   Prolactin 25.4 (H) 4.0 - 15.2 ng/mL    Comment: (NOTE) Performed At: [facility] [address] [place], [place] [id number] Jolene Schimke MD PR:[id number]   Vitamin B12     Status: None   Collection Time: 12/04/19  6:11 PM  Result Value Ref Range   Vitamin B-12 471 180 - 914 pg/mL    Comment: (NOTE) This assay is not validated for testing neonatal or myeloproliferative syndrome specimens for Vitamin B12 levels. Performed at [facility], [address], [place], [place] [address]   Folate     Status: None   Collection Time: 12/04/19  6:11 PM  Result Value Ref Range   Folate 48.9 >5.9 ng/mL     Comment: RESULTS CONFIRMED BY MANUAL DILUTION Performed at [facility], [address] [place], [place] [address]   Iron and TIBC     Status: Abnormal   Collection Time: 12/04/19  6:11  PM  Result Value Ref Range   Iron 40 (L) 45 - 182 ug/dL   TIBC 150 569 - 794 ug/dL   Saturation Ratios 11 (L) 17.9 - 39.5 %   UIBC 322 ug/dL    Comment: Performed at [facility], [address] [place], [place] [address]  Ferritin     Status: None   Collection Time: 12/04/19  6:11 PM  Result Value Ref Range   Ferritin 64 24 - 336 ng/mL    Comment: Performed at [facility], [address], [place], [place] [address]  Reticulocytes     Status: None   Collection Time: 12/04/19  6:11 PM  Result Value Ref Range   Retic Ct Pct 0.9 0.4 - 3.1 %   RBC. 5.09 4.22 - 5.81 MIL/uL   Retic Count, Absolute 44.3 19.0 - 186.0 K/uL   Immature Retic Fract 12.8 2.3 - 15.9 %    Comment: Performed at [facility], [address], [place], [place] [address]    Blood Alcohol level:  Lab Results  Component Value Date   ETH 219 (H) 11/30/2019   ETH <10 06/24/2017    Metabolic Disorder Labs: Lab Results  Component Value Date   HGBA1C 4.8 12/03/2019   MPG 91.06 12/03/2019   MPG 85.32 06/27/2017   Lab Results  Component Value Date   PROLACTIN 25.4 (H) 12/04/2019   Lab Results  Component Value Date   CHOL 157 12/03/2019   TRIG 135 12/03/2019   HDL 68 12/03/2019   CHOLHDL 2.3 12/03/2019   VLDL 27 12/03/2019   LDLCALC 62 12/03/2019   LDLCALC 77 06/27/2017    Physical Findings: AIMS: Facial and Oral Movements Muscles of Facial Expression: None, normal Lips and Perioral Area: None, normal Jaw: None, normal Tongue: None, normal,Extremity Movements Upper (arms, wrists, hands, fingers): None, normal Lower (legs, knees, ankles, toes): None, normal, Trunk Movements Neck, shoulders, hips: None, normal, Overall Severity Severity of  abnormal movements (highest score from questions above): None, normal Incapacitation due to abnormal movements: None, normal Patient's awareness of abnormal movements (rate only patient's report): No Awareness, Dental Status Current problems with teeth and/or dentures?: No Does patient usually wear dentures?: No  CIWA:  CIWA-Ar Total: 0 COWS:  COWS Total Score: 0  Musculoskeletal: Strength & Muscle Tone: within normal limits Gait & Station: normal Patient leans: N/A  Psychiatric Specialty Exam: Physical Exam Vitals and nursing note reviewed.  Constitutional:      Appearance: Normal appearance.  HENT:     Head: Normocephalic and atraumatic.  Pulmonary:     Effort: Pulmonary effort is normal.  Neurological:     General: No focal deficit present.     Mental Status: He is alert and oriented to person, place, and time.     Review of Systems  All other systems reviewed and are negative.   Blood pressure 110/80, pulse (!) 113, temperature (!) 97.5 F (36.4 C), temperature source Oral, resp. rate 18, height 5\' 11"  (1.803 m), weight 66.7 kg, SpO2 100 %.Body mass index is 20.5 kg/m.  General Appearance: Casual  Eye Contact:  Minimal  Speech:  Normal Rate  Volume:  Increased  Mood:  Irritable  Affect:  Labile  Thought Process:  Goal Directed and Descriptions of Associations: Circumstantial  Orientation:  Negative  Thought Content:  Negative  Suicidal Thoughts:  No  Homicidal Thoughts:  No  Memory:  Immediate;   Poor Recent;   Poor Remote;  Poor  Judgement:  Impaired  Insight:  Lacking  Psychomotor Activity:  Increased  Concentration:  Concentration: Fair and Attention Span: Fair  Recall:  [employer]iservFair  Fund of Knowledge:  Fair  Language:  Fair  Akathisia:  Negative  Handed:  Right  AIMS (if indicated):     Assets:  Desire for Improvement Resilience  ADL's:  Intact  Cognition:  WNL  Sleep:  Number of Hours: 8.75     Treatment Plan Summary: Daily contact with patient to  assess and evaluate symptoms and progress in treatment, Medication management and Plan : Patient is seen and examined.  Patient is a 29 year old male with the above-stated past psychiatric history who is seen in follow-up.   Diagnosis: 1.  Reported history of schizophrenia. 2.  Alcohol use disorder 3.  Anemia  Pertinent findings on examination today: 1.  More irritable than yesterday. 2.  Denies all symptoms. 3.  Good sleep.  Plan: 1.  Continue folic acid 1 mg p.o. daily for nutritional supplementation. 2.  Change Haldol to 10 mg p.o. twice daily for psychosis and mood stability. 3.  Continue hydroxyzine 25 mg p.o. 3 times daily as needed anxiety. 4.  Continue lithium carbonate 300 mg p.o. nightly for mood stability. 5.  Continue lorazepam 1 mg p.o. every 6 hours as needed a CIWA greater than 10. 6.  Continue Zyprexa agitation protocol as needed. 7.  Continue thiamine 100 mg p.o. daily for nutritional supplementation. 8.  Continue trazodone 50 mg p.o. nightly as needed insomnia. 9.  We will go on and give him the long-acting haloperidol decanoate 100 mg IM today. 10.  Disposition planning-in progress.  Antonieta PertGreg Lawson Ninoshka Wainwright, MD 12/05/2019, 1:47 PM

## 2019-12-05 NOTE — [facility] Group Notes (Signed)
Patient did not attend orientation group.  

## 2019-12-05 NOTE — Progress Notes (Signed)
Recreation Therapy Notes  INPATIENT RECREATION THERAPY ASSESSMENT  Patient Details Name: Nathan Barnett. MRN: [id number] DOB: 07-17-1990 Today's Date: 12/05/2019       Information Obtained From: Chart Review  Able to Participate in Assessment/Interview: No  Reason for Admission (Per Patient): Med Non-Compliance, Other (Comments) (Psychosis)  Patient Stressors: Other (Comment) (Unemployed; Finances; Housing)  Coping Skills:    (None identified)  Leisure Interests (2+):   (None identified)  [place] of Residence:  Film/video editor  Patient Main Form of Transportation: Set designer  Patient Strengths:  None identified  Patient Identified Areas of Improvement:  None identified  Patient Goal for Hospitalization:  Be positive  Staff Intervention Plan: Collaborate with Interdisciplinary Treatment Team, Group Attendance  Consent to Intern Participation: N/A    Caroll Rancher, LRT/CTRS  Lillia Abed, Yannick Steuber A 12/05/2019, 1:51 PM

## 2019-12-05 NOTE — Progress Notes (Signed)
Recreation Therapy Notes  Date: 10.7.21 Time: 1000 Location: 500 [address] Dayroom  Group Topic: Wellness  Goal Area(s) Addresses:  Patient will define components of whole wellness. Patient will verbalize benefit of whole wellness.  Intervention: Music  Activity: Exercise.  LRT led patients in a series of stretches.  Patients then took turns leading group in exercises of their choosing.  Patients could take breaks and get water as needed.  Education: Wellness, Building control surveyor.   Education Outcome: Acknowledges education/In group clarification offered/Needs additional education.   Clinical Observations/Feedback: Pt did not attend group session.     Caroll Rancher, LRT/CTRS         Caroll Rancher A 12/05/2019 10:43 AM

## 2019-12-05 NOTE — [facility] Group Notes (Signed)
Occupational Therapy Group Note Date: 12/05/2019 Group Topic/Focus: Relaxation  Group Description: Group encouraged increased engagement and participation through a guided imagery mindfulness practice to promote relaxation and symptom management. Patients were encouraged to share their own relaxation strategies/practices and were then encouraged to follow along with the guided imagery script provided. Patients reported benefit and more relaxed post group.   Therapeutic Goal(s): Identify personal relaxation strategies and techniques to reduce stress and emotional distress  Participation Level:  Patient did not attend OT group session despite personal invitation.     Plan: Continue to engage patient in OT groups 2 - 3x/week.  12/05/2019  Donovon Micheletti, MOT, OTR/L 

## 2019-12-06 MED ORDER — LITHIUM CITRATE 300 MG/5 ML PO SYRP
450.0000 mg | Freq: Every day | ORAL | Status: DC
  Administered 2019-12-06 – 2019-12-08 (×3): 450 mg via ORAL
  Filled 2019-12-06 (×4): qty 7.5

## 2019-12-06 NOTE — Progress Notes (Signed)
[facility] NOVEL CORONAVIRUS (COVID-19) DAILY CHECK-OFF SYMPTOMS - answer yes or no to each - every day NO YES  Have you had a fever in the past 24 hours?   Fever (Temp > 37.80C / 100F) X   Have you had any of these symptoms in the past 24 hours?  New Cough   Sore Throat    Shortness of Breath   Difficulty Breathing   Unexplained Body Aches   X   Have you had any one of these symptoms in the past 24 hours not related to allergies?    Runny Nose   Nasal Congestion   Sneezing   X   If you have had runny nose, nasal congestion, sneezing in the past 24 hours, has it worsened?  X   EXPOSURES - check yes or no X   Have you traveled outside the state in the past 14 days?  X   Have you been in contact with someone with a confirmed diagnosis of COVID-19 or PUI in the past 14 days without wearing appropriate PPE?  X   Have you been living in the same home as a person with confirmed diagnosis of COVID-19 or a PUI (household contact)?    X   Have you been diagnosed with COVID-19?    X              What to do next: Answered NO to all: Answered YES to anything:   Proceed with unit schedule Follow the [facility] Inpatient Flowsheet.   Pt visible at medication window on initial contact. Presented with blunted affect and logical speech. Irritable earlier in the shift but became more interactive with others and pleasant. stayed in the dayroom and watched TV with peers this evening. Pt did not attend scheduled groups held earlier this shift despite multiple prompts "Ok, I will come" but never did. Support and encouragement offered. Safety checks maintained without incidents. All medications given as ordered with verbal education and effects monitored. Writer encouraged pt to voice concerns.  Pt tolerates all medications and meals well. Denies adverse drug reactions. Safety maintained on and off unit.

## 2019-12-06 NOTE — Progress Notes (Signed)
[facility] MD Progress Note  12/06/2019 12:15 PM Nathan Barnett.  MRN:  [id number] Subjective:  Patient is a 29 year old male with a reported past psychiatric history significant for schizophrenia who was admitted on 12/04/2019 secondary to noncompliance with medication, worsening paranoia, irritability and violent outburst.  Objective: Patient is seen and examined.  Patient is a 29 year old male with the above-stated past psychiatric history seen in follow-up.  He is essentially unchanged from yesterday.  His only question today is "how much longer do I have to stay".  He denied every other question I asked him.  His vital signs are stable, he is afebrile.  He slept 7.75 hours last night.  No new laboratories from yesterday.  He received the Haldol long-acting injection 100 mg IM yesterday.  He was placed on standing Cogentin 1 mg p.o. twice daily secondary to concern for EPS symptoms after receiving the Haldol Decanoate injection.  His current Haldol oral dosage is 5 mg p.o. daily and 10 mg p.o. nightly.  His lithium dosage is still 300 mg p.o. nightly which is what he was on prior to admission.  His lithium level from 10/6 was 0.16.  Principal Problem: <principal problem not specified> Diagnosis: Active Problems:   Schizoaffective disorder (HCC)   Psychosis (HCC)  Total Time spent with patient: 20 minutes  Past Psychiatric History: See admission H&P  Past Medical History: History reviewed. No pertinent past medical history. History reviewed. No pertinent surgical history. Family History: History reviewed. No pertinent family history. Family Psychiatric  History: Admission H&P Social History:  Social History   Substance and Sexual Activity  Alcohol Use Yes     Social History   Substance and Sexual Activity  Drug Use Not Currently    Social History   Socioeconomic History  . Marital status: Single    Spouse name: Not on file  . Number of children: Not on file  . Years of  education: Not on file  . Highest education level: Not on file  Occupational History  . Not on file  Tobacco Use  . Smoking status: Current Some Day Smoker    Packs/day: 1.00    Years: 2.00    Pack years: 2.00    Types: Cigarettes  . Smokeless tobacco: Never Used  Vaping Use  . Vaping Use: Never used  Substance and Sexual Activity  . Alcohol use: Yes  . Drug use: Not Currently  . Sexual activity: Not Currently  Other Topics Concern  . Not on file  Social History Narrative  . Not on file   Social Determinants of Health   Financial Resource Strain:   . Difficulty of Paying Living Expenses: Not on file  Food Insecurity:   . Worried About Programme researcher, broadcasting/film/video in the Last Year: Not on file  . Ran Out of Food in the Last Year: Not on file  Transportation Needs:   . Lack of Transportation (Medical): Not on file  . Lack of Transportation (Non-Medical): Not on file  Physical Activity:   . Days of Exercise per Week: Not on file  . Minutes of Exercise per Session: Not on file  Stress:   . Feeling of Stress : Not on file  Social Connections:   . Frequency of Communication with Friends and Family: Not on file  . Frequency of Social Gatherings with Friends and Family: Not on file  . Attends Religious Services: Not on file  . Active Member of Clubs or Organizations: Not on file  .  Attends Banker Meetings: Not on file  . Marital Status: Not on file   Additional Social History:                         Sleep: Good  Appetite:  Good  Current Medications: Current Facility-Administered Medications  Medication Dose Route Frequency Provider Last Rate Last Admin  . acetaminophen (TYLENOL) tablet 650 mg  650 mg Oral Q6H PRN Antonieta Pert, MD      . alum & mag hydroxide-simeth (MAALOX/MYLANTA) 200-200-20 MG/5ML suspension 30 mL  30 mL Oral Q4H PRN Antonieta Pert, MD      . benztropine (COGENTIN) tablet 1 mg  1 mg Oral BID Antonieta Pert, MD   1 mg at  12/06/19 0930  . folic acid (FOLVITE) tablet 1 mg  1 mg Oral Daily Antonieta Pert, MD   1 mg at 12/06/19 0931  . haloperidol (HALDOL) tablet 10 mg  10 mg Oral QHS Antonieta Pert, MD   10 mg at 12/05/19 2058  . haloperidol (HALDOL) tablet 5 mg  5 mg Oral Daily Antonieta Pert, MD   5 mg at 12/06/19 0932  . hydrOXYzine (ATARAX/VISTARIL) tablet 25 mg  25 mg Oral TID PRN Antonieta Pert, MD   25 mg at 12/05/19 2058  . lithium carbonate capsule 300 mg  300 mg Oral QHS Antonieta Pert, MD   300 mg at 12/05/19 2059  . OLANZapine zydis (ZYPREXA) disintegrating tablet 10 mg  10 mg Oral Q8H PRN Antonieta Pert, MD       And  . LORazepam (ATIVAN) tablet 1 mg  1 mg Oral Q6H PRN Antonieta Pert, MD       And  . ziprasidone (GEODON) injection 20 mg  20 mg Intramuscular Q6H PRN Antonieta Pert, MD      . LORazepam (ATIVAN) tablet 1 mg  1 mg Oral Q6H PRN Antonieta Pert, MD      . magnesium hydroxide (MILK OF MAGNESIA) suspension 30 mL  30 mL Oral Daily PRN Antonieta Pert, MD      . thiamine tablet 100 mg  100 mg Oral Daily Antonieta Pert, MD   100 mg at 12/06/19 0931  . traZODone (DESYREL) tablet 50 mg  50 mg Oral QHS PRN Antonieta Pert, MD   50 mg at 12/05/19 2058    Lab Results:  Results for orders placed or performed during the hospital encounter of 12/03/19 (from the past 48 hour(s))  Lithium level     Status: Abnormal   Collection Time: 12/04/19  6:11 PM  Result Value Ref Range   Lithium Lvl 0.16 (L) 0.60 - 1.20 mmol/L    Comment: Performed at [facility], [address], [place], [place] [address]  Prolactin     Status: Abnormal   Collection Time: 12/04/19  6:11 PM  Result Value Ref Range   Prolactin 25.4 (H) 4.0 - 15.2 ng/mL    Comment: (NOTE) Performed At: [facility] [address] [place], [place] [id number] Jolene Schimke MD [id number]   Vitamin B12     Status: None   Collection Time: 12/04/19  6:11 PM  Result  Value Ref Range   Vitamin B-12 471 180 - 914 pg/mL    Comment: (NOTE) This assay is not validated for testing neonatal or myeloproliferative syndrome specimens for Vitamin B12 levels. Performed at [facility], [address] [place], [place] [address]   Folate     Status: None   Collection Time: 12/04/19  6:11 PM  Result Value Ref Range   Folate 48.9 >5.9 ng/mL    Comment: RESULTS CONFIRMED BY MANUAL DILUTION Performed at [facility], [address] [place], [place] [address]   Iron and TIBC     Status: Abnormal   Collection Time: 12/04/19  6:11 PM  Result Value Ref Range   Iron 40 (L) 45 - 182 ug/dL   TIBC 829362 562250 - 130450 ug/dL   Saturation Ratios 11 (L) 17.9 - 39.5 %   UIBC 322 ug/dL    Comment: Performed at [facility], [address] [place], [place] [address]  Ferritin     Status: None   Collection Time: 12/04/19  6:11 PM  Result Value Ref Range   Ferritin 64 24 - 336 ng/mL    Comment: Performed at [facility], [address] [place], [place] [address]  Reticulocytes     Status: None   Collection Time: 12/04/19  6:11 PM  Result Value Ref Range   Retic Ct Pct 0.9 0.4 - 3.1 %   RBC. 5.09 4.22 - 5.81 MIL/uL   Retic Count, Absolute 44.3 19.0 - 186.0 K/uL   Immature Retic Fract 12.8 2.3 - 15.9 %    Comment: Performed at [facility], [address] [place], [place] [address]    Blood Alcohol level:  Lab Results  Component Value Date   ETH 219 (H) 11/30/2019   ETH <10 06/24/2017    Metabolic Disorder Labs: Lab Results  Component Value Date   HGBA1C 4.8 12/03/2019   MPG 91.06 12/03/2019   MPG 85.32 06/27/2017   Lab Results  Component Value Date   PROLACTIN 25.4 (H) 12/04/2019   Lab Results  Component Value Date   CHOL 157 12/03/2019   TRIG 135 12/03/2019   HDL 68 12/03/2019   CHOLHDL 2.3 12/03/2019   VLDL 27 12/03/2019   LDLCALC 62 12/03/2019   LDLCALC 77  06/27/2017    Physical Findings: AIMS: Facial and Oral Movements Muscles of Facial Expression: None, normal Lips and Perioral Area: None, normal Jaw: None, normal Tongue: None, normal,Extremity Movements Upper (arms, wrists, hands, fingers): None, normal Lower (legs, knees, ankles, toes): None, normal, Trunk Movements Neck, shoulders, hips: None, normal, Overall Severity Severity of abnormal movements (highest score from questions above): None, normal Incapacitation due to abnormal movements: None, normal Patient's awareness of abnormal movements (rate only patient's report): No Awareness, Dental Status Current problems with teeth and/or dentures?: No Does patient usually wear dentures?: No  CIWA:  CIWA-Ar Total: 0 COWS:  COWS Total Score: 0  Musculoskeletal: Strength & Muscle Tone: within normal limits Gait & Station: normal Patient leans: N/A  Psychiatric Specialty Exam: Physical Exam Vitals and nursing note reviewed.  HENT:     Head: Normocephalic and atraumatic.  Pulmonary:     Effort: Pulmonary effort is normal.  Neurological:     General: No focal deficit present.     Mental Status: He is alert.     Review of Systems  All other systems reviewed and are negative.   Blood pressure 115/80, pulse (!) 103, temperature 97.8 F (36.6 C), temperature source Oral, resp. rate 16, height 5\' 11"  (1.803 m), weight 66.7 kg, SpO2 100 %.Body mass index is 20.5 kg/m.  General Appearance: Casual  Eye Contact:  Minimal  Speech:  Normal Rate  Volume:  Normal  Mood:  Irritable  Affect:  Congruent  Thought Process:  Goal Directed and Descriptions of Associations: Circumstantial  Orientation:  Negative  Thought Content:  Delusions and Paranoid Ideation  Suicidal Thoughts:  No  Homicidal Thoughts:  No  Memory:  Immediate;   Poor Recent;   Poor Remote;   Poor  Judgement:  Impaired  Insight:  Lacking  Psychomotor Activity:  Normal  Concentration:  Concentration: Fair and  Attention Span: Fair  Recall:  [employer] of Knowledge:  Fair  Language:  Fair  Akathisia:  Negative  Handed:  Right  AIMS (if indicated):     Assets:  Desire for Improvement Resilience  ADL's:  Intact  Cognition:  WNL  Sleep:  Number of Hours: 7.75     Treatment Plan Summary: Daily contact with patient to assess and evaluate symptoms and progress in treatment, Medication management and Plan : Patient is seen and examined.  Patient is a 29 year old male with the above-stated past psychiatric history who is seen in follow-up.   Diagnosis: 1.  Reported history of schizophrenia. 2.  Alcohol use disorder 3.  Anemia  Pertinent findings on examination today: 1.  Continued irritability. 2.  Denies all symptoms. 3.  Good sleep.  Plan: 1.  Continue folic acid 1 mg p.o. daily for nutritional supplementation. 2.  Decrease Haldol to 10 mg p.o. nightly for psychosis and mood stability. 3.  Continue hydroxyzine 25 mg p.o. 3 times daily as needed anxiety. 4.    Increase lithium carbonate to 450 mg p.o. nightly for mood stability. 5.  Continue lorazepam 1 mg p.o. every 6 hours as needed a CIWA greater than 10. 6.  Continue Zyprexa agitation protocol as needed. 7.  Continue thiamine 100 mg p.o. daily for nutritional supplementation. 8.  Continue trazodone 50 mg p.o. nightly as needed insomnia. 9.    He received the long-acting haloperidol decanoate 100 mg IM on 12/05/2019.. 10.  Continue Cogentin 1 mg p.o. twice daily for EPS symptoms. 11.  Disposition planning-in progress.  Antonieta Pert, MD 12/06/2019, 12:15 PM

## 2019-12-06 NOTE — [facility] Group Notes (Signed)
Patient did not attend orientation group or psycho-educational group.

## 2019-12-06 NOTE — Progress Notes (Signed)
Pt continues to forward little and remains guarded, but was seen in the dayroom watching TV much of the evening    12/06/19 2200  Psych Admission Type (Psych Patients Only)  Admission Status Involuntary  Psychosocial Assessment  Patient Complaints Anxiety  Eye Contact Brief  Facial Expression Angry;Anxious  Affect Angry;Anxious;Irritable;Preoccupied  Speech Aggressive;Pressured  Interaction Cautious;Guarded;Evasive  Motor Activity Fidgety  Appearance/Hygiene Disheveled  Behavior Characteristics Guarded  Mood Labile;Suspicious  Thought Process  Coherency Concrete thinking  Content Preoccupation;Blaming others  Delusions Persecutory  Perception Derealization  Hallucination None reported or observed  Judgment Poor  Confusion None  Danger to Self  Current suicidal ideation? Denies  Danger to Others  Danger to Others None reported or observed

## 2019-12-07 MED ORDER — TRAZODONE HCL 50 MG PO TABS
50.0000 mg | ORAL_TABLET | Freq: Every evening | ORAL | Status: DC | PRN
  Administered 2019-12-07 – 2019-12-08 (×3): 50 mg via ORAL
  Filled 2019-12-07 (×2): qty 1
  Filled 2019-12-07: qty 14
  Filled 2019-12-07: qty 1

## 2019-12-07 NOTE — Progress Notes (Signed)
[facility] MD Progress Note  12/07/2019 2:18 PM Nathan Barnett.  MRN:  [id number] Subjective:  Patient is a 29 year old male with a reported past psychiatric history significant for schizophrenia who was admitted on 12/04/2019 secondary to noncompliance with medication, worsening paranoia, irritability and violent outburst.  Objective: Patient is seen and examined.  Patient is a 29 year old male with the above-stated past psychiatric history seen in follow-up.  He seems to be slightly less irritable today.  He still has poor eye contact.  His speech is of normal volume and tone.  His only question today was "I am getting out of here on Monday, right?"  He has been compliant with his medications.  He has no insight to his issues.  Nursing notes have not revealed any problems with behavior during his time on the unit.  No new laboratories.  His vital signs are stable, he is afebrile.  He slept 7.5 hours.  With regard to his akathisia he stated that that was well balanced with the addition of the Cogentin.  Principal Problem: <principal problem not specified> Diagnosis: Active Problems:   Schizoaffective disorder (HCC)   Psychosis (HCC)  Total Time spent with patient: 15 minutes  Past Psychiatric History: See admission H&P  Past Medical History: History reviewed. No pertinent past medical history. History reviewed. No pertinent surgical history. Family History: History reviewed. No pertinent family history. Family Psychiatric  History: See admission H&P Social History:  Social History   Substance and Sexual Activity  Alcohol Use Yes     Social History   Substance and Sexual Activity  Drug Use Not Currently    Social History   Socioeconomic History  . Marital status: Single    Spouse name: Not on file  . Number of children: Not on file  . Years of education: Not on file  . Highest education level: Not on file  Occupational History  . Not on file  Tobacco Use  . Smoking status:  Current Some Day Smoker    Packs/day: 1.00    Years: 2.00    Pack years: 2.00    Types: Cigarettes  . Smokeless tobacco: Never Used  Vaping Use  . Vaping Use: Never used  Substance and Sexual Activity  . Alcohol use: Yes  . Drug use: Not Currently  . Sexual activity: Not Currently  Other Topics Concern  . Not on file  Social History Narrative  . Not on file   Social Determinants of Health   Financial Resource Strain:   . Difficulty of Paying Living Expenses: Not on file  Food Insecurity:   . Worried About Programme researcher, broadcasting/film/video in the Last Year: Not on file  . Ran Out of Food in the Last Year: Not on file  Transportation Needs:   . Lack of Transportation (Medical): Not on file  . Lack of Transportation (Non-Medical): Not on file  Physical Activity:   . Days of Exercise per Week: Not on file  . Minutes of Exercise per Session: Not on file  Stress:   . Feeling of Stress : Not on file  Social Connections:   . Frequency of Communication with Friends and Family: Not on file  . Frequency of Social Gatherings with Friends and Family: Not on file  . Attends Religious Services: Not on file  . Active Member of Clubs or Organizations: Not on file  . Attends Banker Meetings: Not on file  . Marital Status: Not on file   Additional Social  History:                         Sleep: Good  Appetite:  Good  Current Medications: Current Facility-Administered Medications  Medication Dose Route Frequency Provider Last Rate Last Admin  . acetaminophen (TYLENOL) tablet 650 mg  650 mg Oral Q6H PRN Antonieta Pert, MD      . alum & mag hydroxide-simeth (MAALOX/MYLANTA) 200-200-20 MG/5ML suspension 30 mL  30 mL Oral Q4H PRN Antonieta Pert, MD      . benztropine (COGENTIN) tablet 1 mg  1 mg Oral BID Antonieta Pert, MD   1 mg at 12/07/19 0906  . folic acid (FOLVITE) tablet 1 mg  1 mg Oral Daily Antonieta Pert, MD   1 mg at 12/07/19 3818  . haloperidol  (HALDOL) tablet 10 mg  10 mg Oral QHS Antonieta Pert, MD   10 mg at 12/06/19 2119  . hydrOXYzine (ATARAX/VISTARIL) tablet 25 mg  25 mg Oral TID PRN Antonieta Pert, MD   25 mg at 12/06/19 2119  . lithium citrate 300 MG/5ML solution 450 mg  450 mg Oral QHS Antonieta Pert, MD   450 mg at 12/06/19 2119  . OLANZapine zydis (ZYPREXA) disintegrating tablet 10 mg  10 mg Oral Q8H PRN Antonieta Pert, MD       And  . LORazepam (ATIVAN) tablet 1 mg  1 mg Oral Q6H PRN Antonieta Pert, MD       And  . ziprasidone (GEODON) injection 20 mg  20 mg Intramuscular Q6H PRN Antonieta Pert, MD      . LORazepam (ATIVAN) tablet 1 mg  1 mg Oral Q6H PRN Antonieta Pert, MD      . magnesium hydroxide (MILK OF MAGNESIA) suspension 30 mL  30 mL Oral Daily PRN Antonieta Pert, MD      . thiamine tablet 100 mg  100 mg Oral Daily Antonieta Pert, MD   100 mg at 12/07/19 0906  . traZODone (DESYREL) tablet 50 mg  50 mg Oral QHS PRN Antonieta Pert, MD   50 mg at 12/06/19 2119    Lab Results: No results found for this or any previous visit (from the past 48 hour(s)).  Blood Alcohol level:  Lab Results  Component Value Date   ETH 219 (H) 11/30/2019   ETH <10 06/24/2017    Metabolic Disorder Labs: Lab Results  Component Value Date   HGBA1C 4.8 12/03/2019   MPG 91.06 12/03/2019   MPG 85.32 06/27/2017   Lab Results  Component Value Date   PROLACTIN 25.4 (H) 12/04/2019   Lab Results  Component Value Date   CHOL 157 12/03/2019   TRIG 135 12/03/2019   HDL 68 12/03/2019   CHOLHDL 2.3 12/03/2019   VLDL 27 12/03/2019   LDLCALC 62 12/03/2019   LDLCALC 77 06/27/2017    Physical Findings: AIMS: Facial and Oral Movements Muscles of Facial Expression: None, normal Lips and Perioral Area: None, normal Jaw: None, normal Tongue: None, normal,Extremity Movements Upper (arms, wrists, hands, fingers): None, normal Lower (legs, knees, ankles, toes): None, normal, Trunk Movements Neck,  shoulders, hips: None, normal, Overall Severity Severity of abnormal movements (highest score from questions above): None, normal Incapacitation due to abnormal movements: None, normal Patient's awareness of abnormal movements (rate only patient's report): No Awareness, Dental Status Current problems with teeth and/or dentures?: No Does patient usually wear dentures?: No  CIWA:  CIWA-Ar Total: 0 COWS:  COWS Total Score: 0  Musculoskeletal: Strength & Muscle Tone: within normal limits Gait & Station: normal Patient leans: N/A  Psychiatric Specialty Exam: Physical Exam Vitals and nursing note reviewed.  Constitutional:      Appearance: Normal appearance.  HENT:     Head: Normocephalic and atraumatic.  Pulmonary:     Effort: Pulmonary effort is normal.  Neurological:     General: No focal deficit present.     Mental Status: He is alert and oriented to person, place, and time.     Review of Systems  All other systems reviewed and are negative.   Blood pressure 104/76, pulse 88, temperature (!) 97.5 F (36.4 C), temperature source Oral, resp. rate 18, height 5\' 11"  (1.803 m), weight 66.7 kg, SpO2 100 %.Body mass index is 20.5 kg/m.  General Appearance: Casual  Eye Contact:  Minimal  Speech:  Normal Rate  Volume:  Normal  Mood:  Dysphoric  Affect:  Congruent  Thought Process:  Coherent and Descriptions of Associations: Circumstantial  Orientation:  Full (Time, Place, and Person)  Thought Content:  Paranoid Ideation  Suicidal Thoughts:  No  Homicidal Thoughts:  No  Memory:  Immediate;   Fair Recent;   Fair Remote;   Fair  Judgement:  Impaired  Insight:  Lacking  Psychomotor Activity:  Normal  Concentration:  Concentration: Good and Attention Span: Good  Recall:  Good  Fund of Knowledge:  Good  Language:  Good  Akathisia:  Yes  Handed:  Right  AIMS (if indicated):     Assets:  Desire for Improvement Housing Resilience  ADL's:  Intact  Cognition:  WNL  Sleep:   Number of Hours: 7.5     Treatment Plan Summary: Daily contact with patient to assess and evaluate symptoms and progress in treatment, Medication management and Plan : Patient is seen and examined.  Patient is a 29 year old male with the above-stated past psychiatric history who is seen in follow-up.   Diagnosis: 1. Reported history of schizophrenia. 2. Alcohol use disorder 3. Anemia  Pertinent findings on examination today: 1.  Some decreased irritability. 2.  Denies all psychiatric symptoms. 3.  Sleep is good. 4.  Akathisia improved with Cogentin.  Plan: 1. Continue folic acid 1 mg p.o. daily for nutritional supplementation. 2.  Decrease Haldol to 10 mg p.o. nightly for psychosis and mood stability. 3. Continue hydroxyzine 25 mg p.o. 3 times daily as needed anxiety. 4.  Continue lithium carbonate to 450 mg p.o. nightly for mood stability. 5. Continue lorazepam 1 mg p.o. every 6 hours as needed a CIWA greater than 10. 6. Continue Zyprexa agitation protocol as needed. 7. Continue thiamine 100 mg p.o. daily for nutritional supplementation. 8. Continue trazodone 50 mg p.o. nightly as needed insomnia. 9.   He received the long-acting haloperidol decanoate 100 mg IM on 12/05/2019.. 10.  Continue Cogentin 1 mg p.o. twice daily for EPS symptoms. 11.  Disposition planning-in progress.  02/04/2020, MD 12/07/2019, 2:18 PM

## 2019-12-07 NOTE — [facility] Group Notes (Signed)
.  Psychoeducational Group Note    Date: 12-07-19 Time: 0900-1000    Goal Setting   Purpose of Group: This group helps to provide patients with the steps of setting a goal that is specific, measurable, attainable, realistic and time specific. A discussion on how we keep ourselves stuck with negative self talk.    Participation Level:  Did not attend   Seretha Estabrooks A  

## 2019-12-07 NOTE — Progress Notes (Signed)
Pt observed pacing in hall, chanting loudly, screamed in hall twice with closed fists in a agitated state this evening. Appears to be responding to internal stimuli. Per [employer] tech, pt continued chanting in cafeteria and screamed loudly twice "Fuck" while standing in line behind peers. Verbally abusive towards a male peer who was walking behind him to the dayroom  "Fuck you". Pt proceeded to lightly punching hall mirror on ceiling. Rates his anxiety, depression and hopelessness all 0/10. Confirms he slept well with good appetite on self inventory sheet.  PRN Zydis 10 mg PO given at 1706 with scheduled Cogentin. Emotional support and encouragement offered to pt throughout this shift. Q 15 minutes safety checks maintained. Scheduled medications given as ordered and effects monitored.  Pt seen sitting in dayroom at 1805 watching TV without screaming. Compliant with medications. Affect remains blunted with irritable mood. Safety maintained on and off unit.

## 2019-12-07 NOTE — Progress Notes (Signed)
Adult Psychoeducational Group Note  Date:  12/07/2019 Time:  12:57 AM  Group Topic/Focus:  Wrap-Up Group:   The focus of this group is to help patients review their daily goal of treatment and discuss progress on daily workbooks.  Participation Level:  Minimal  Participation Quality:  Appropriate  Affect:  Flat  Cognitive:  Appropriate  Insight: Limited  Engagement in Group:  Limited  Modes of Intervention:  Discussion  Additional Comments:  Pt stated his goal for today was to focus on his treatment plan. Pt stated he accomplished his goal today. Pt stated he was able to talk with his doctor and social worker about his care today. Pt rated his overall day a 10. Pt stated his relationship with his family and support system needs to be improved.  Pt stated he was able to attend all meals. Pt stated he took all medications provided today. Pt stated he felt better about himself today. Pt stated his appetite had improved today. Pt rated sleep last night was pretty good. Pt stated he was no physical pain today. Pt deny auditory or visual hallucinations. Pt denies thoughts of harming himself or others. Pt stated he would alert staff if anything changes.  Felipa Furnace 12/07/2019, 12:57 AM

## 2019-12-08 NOTE — [facility] Group Notes (Signed)
Adult Psychoeducational Group Not °Date:  12/08/2019 °Time:  0900-1045 °Group Topic/Focus: PROGRESSIVE RELAXATION. A group where deep breathing is taught and tensing and relaxation muscle groups is used. Imagery is used as well.  Pts are asked to imagine 3 pillars that hold them up when they are not able to hold themselves up. ° °Participation Level:  Did not attend ° ° °Nathan Barnett A °12/08/2019 ° ° ° °

## 2019-12-08 NOTE — Progress Notes (Signed)
[facility] MD Progress Note  12/08/2019 2:33 PM [name].  MRN:  [id number] Subjective:  Patient is a 29 year old male with a reported past psychiatric history significant for schizophrenia who was admitted on 12/04/2019 secondary to noncompliance with medication, worsening paranoia, irritability and violent outburst.  Objective: Patient is seen and examined.  Patient is a 29 year old male with the above-stated past psychiatric history who is seen in follow-up.  His irritability continues to decrease.  He is actually able to carry on somewhat of a conversation today.  He answers things a little bit more appropriately.  He denied any auditory or visual hallucinations.  He was down the hall a little bit earlier and it appeared as though he was responding to internal stimuli, but but he denied then stated "I was thinking about my future and is talking out loud".  He has been compliant with his medications.  He denied any current side effects of his medications.  His vital signs are stable, he is afebrile.  He slept 5.75 hours last night.  No new laboratories.  Principal Problem: <principal problem not specified> Diagnosis: Active Problems:   Schizoaffective disorder (HCC)   Psychosis (HCC)  Total Time spent with patient: 20 minutes  Past Psychiatric History: See admission H&P  Past Medical History: History reviewed. No pertinent past medical history. History reviewed. No pertinent surgical history. Family History: History reviewed. No pertinent family history. Family Psychiatric  History: See admission H&P Social History:  Social History   Substance and Sexual Activity  Alcohol Use Yes     Social History   Substance and Sexual Activity  Drug Use Not Currently    Social History   Socioeconomic History  . Marital status: Single    Spouse name: Not on file  . Number of children: Not on file  . Years of education: Not on file  . Highest education level: Not on file  Occupational  History  . Not on file  Tobacco Use  . Smoking status: Current Some Day Smoker    Packs/day: 1.00    Years: 2.00    Pack years: 2.00    Types: Cigarettes  . Smokeless tobacco: Never Used  Vaping Use  . Vaping Use: Never used  Substance and Sexual Activity  . Alcohol use: Yes  . Drug use: Not Currently  . Sexual activity: Not Currently  Other Topics Concern  . Not on file  Social History Narrative  . Not on file   Social Determinants of Health   Financial Resource Strain:   . Difficulty of Paying Living Expenses: Not on file  Food Insecurity:   . Worried About Programme researcher, broadcasting/film/video in the Last Year: Not on file  . Ran Out of Food in the Last Year: Not on file  Transportation Needs:   . Lack of Transportation (Medical): Not on file  . Lack of Transportation (Non-Medical): Not on file  Physical Activity:   . Days of Exercise per Week: Not on file  . Minutes of Exercise per Session: Not on file  Stress:   . Feeling of Stress : Not on file  Social Connections:   . Frequency of Communication with Friends and Family: Not on file  . Frequency of Social Gatherings with Friends and Family: Not on file  . Attends Religious Services: Not on file  . Active Member of Clubs or Organizations: Not on file  . Attends Banker Meetings: Not on file  . Marital Status: Not on  file   Additional Social History:                         Sleep: Fair  Appetite:  Good  Current Medications: Current Facility-Administered Medications  Medication Dose Route Frequency Provider Last Rate Last Admin  . acetaminophen (TYLENOL) tablet 650 mg  650 mg Oral Q6H PRN Antonieta Pert, MD      . alum & mag hydroxide-simeth (MAALOX/MYLANTA) 200-200-20 MG/5ML suspension 30 mL  30 mL Oral Q4H PRN Antonieta Pert, MD      . benztropine (COGENTIN) tablet 1 mg  1 mg Oral BID Antonieta Pert, MD   1 mg at 12/08/19 0917  . folic acid (FOLVITE) tablet 1 mg  1 mg Oral Daily Antonieta Pert, MD   1 mg at 12/08/19 4010  . haloperidol (HALDOL) tablet 10 mg  10 mg Oral QHS Antonieta Pert, MD   10 mg at 12/07/19 2027  . hydrOXYzine (ATARAX/VISTARIL) tablet 25 mg  25 mg Oral TID PRN Antonieta Pert, MD   25 mg at 12/07/19 2027  . lithium citrate 300 MG/5ML solution 450 mg  450 mg Oral QHS Antonieta Pert, MD   450 mg at 12/07/19 2026  . OLANZapine zydis (ZYPREXA) disintegrating tablet 10 mg  10 mg Oral Q8H PRN Antonieta Pert, MD   10 mg at 12/07/19 1706   And  . LORazepam (ATIVAN) tablet 1 mg  1 mg Oral Q6H PRN Antonieta Pert, MD       And  . ziprasidone (GEODON) injection 20 mg  20 mg Intramuscular Q6H PRN Antonieta Pert, MD      . LORazepam (ATIVAN) tablet 1 mg  1 mg Oral Q6H PRN Antonieta Pert, MD   1 mg at 12/07/19 2026  . magnesium hydroxide (MILK OF MAGNESIA) suspension 30 mL  30 mL Oral Daily PRN Antonieta Pert, MD      . thiamine tablet 100 mg  100 mg Oral Daily Antonieta Pert, MD   100 mg at 12/08/19 0916  . traZODone (DESYREL) tablet 50 mg  50 mg Oral QHS PRN,MR X 1 [name] A, NP   50 mg at 12/07/19 2221    Lab Results: No results found for this or any previous visit (from the past 48 hour(s)).  Blood Alcohol level:  Lab Results  Component Value Date   ETH 219 (H) 11/30/2019   ETH <10 06/24/2017    Metabolic Disorder Labs: Lab Results  Component Value Date   HGBA1C 4.8 12/03/2019   MPG 91.06 12/03/2019   MPG 85.32 06/27/2017   Lab Results  Component Value Date   PROLACTIN 25.4 (H) 12/04/2019   Lab Results  Component Value Date   CHOL 157 12/03/2019   TRIG 135 12/03/2019   HDL 68 12/03/2019   CHOLHDL 2.3 12/03/2019   VLDL 27 12/03/2019   LDLCALC 62 12/03/2019   LDLCALC 77 06/27/2017    Physical Findings: AIMS: Facial and Oral Movements Muscles of Facial Expression: None, normal Lips and Perioral Area: None, normal Jaw: None, normal Tongue: None, normal,Extremity Movements Upper (arms, wrists, hands,  fingers): None, normal Lower (legs, knees, ankles, toes): None, normal, Trunk Movements Neck, shoulders, hips: None, normal, Overall Severity Severity of abnormal movements (highest score from questions above): None, normal Incapacitation due to abnormal movements: None, normal Patient's awareness of abnormal movements (rate only patient's report): No Awareness, Dental Status Current problems  with teeth and/or dentures?: No Does patient usually wear dentures?: No  CIWA:  CIWA-Ar Total: 0 COWS:  COWS Total Score: 0  Musculoskeletal: Strength & Muscle Tone: within normal limits Gait & Station: normal Patient leans: N/A  Psychiatric Specialty Exam: Physical Exam Vitals and nursing note reviewed.  Constitutional:      Appearance: Normal appearance.  HENT:     Head: Normocephalic and atraumatic.  Pulmonary:     Effort: Pulmonary effort is normal.  Neurological:     General: No focal deficit present.     Mental Status: He is alert and oriented to person, place, and time.     Review of Systems  Blood pressure 126/81, pulse 88, temperature 98 F (36.7 C), temperature source Oral, resp. rate 16, height 5\' 11"  (1.803 m), weight 66.7 kg, SpO2 99 %.Body mass index is 20.5 kg/m.  General Appearance: Casual  Eye Contact:  Fair  Speech:  Normal Rate  Volume:  Normal  Mood:  Dysphoric  Affect:  Congruent  Thought Process:  Coherent and Descriptions of Associations: Circumstantial  Orientation:  Full (Time, Place, and Person)  Thought Content:  Delusions and Hallucinations: Auditory  Suicidal Thoughts:  No  Homicidal Thoughts:  No  Memory:  Immediate;   Fair Recent;   Fair Remote;   Fair  Judgement:  Intact  Insight:  Fair  Psychomotor Activity:  Increased  Concentration:  Concentration: Fair and Attention Span: Fair  Recall:  of Knowledge:  Fair  Language:  Fair  Akathisia:  Negative  Handed:  Right  AIMS (if indicated):     Assets:  Desire for  Improvement Resilience  ADL's:  Intact  Cognition:  WNL  Sleep:  Number of Hours: 5.75     Treatment Plan Summary: Daily contact with patient to assess and evaluate symptoms and progress in treatment, Medication management and Plan : Patient is seen and examined.  Patient is a 29 year old male with the above-stated past psychiatric history who is seen in follow-up.   Diagnosis: 1. Reported history of schizophrenia. 2. Alcohol use disorder 3. Anemia  Pertinent findings on examination today: 1.  Irritability continues to decrease. 2.  Patient denied all auditory or visual hallucinations, but he did appear to be responding to internal stimuli earlier today. 3.  Sleep is stable. 4.  Akathisia controlled with Cogentin.  Plan: 1. Continue folic acid 1 mg p.o. daily for nutritional supplementation. 2. Increase Haldol back to 15 mg p.o.nightlyfor psychosis and mood stability. 3. Continue hydroxyzine 25 mg p.o. 3 times daily as needed anxiety. 4. Continuelithium carbonate to450 mg p.o. nightly for mood stability. 5. Continue lorazepam 1 mg p.o. every 6 hours as needed a CIWA greater than 10. 6. Continue Zyprexa agitation protocol as needed. 7. Continue thiamine 100 mg p.o. daily for nutritional supplementation. 8. Continue trazodone 50 mg p.o. nightly as needed insomnia. 9.He received thelong-acting haloperidol decanoate 100 mg IM on 12/05/2019.. 10. Continue Cogentin 1 mg p.o. twice daily for EPS symptoms. 11. Lithium level, TSH and metabolic panel in a.m. tomorrow. 12.  Disposition planning-in progress.  02/04/2020, MD 12/08/2019, 2:33 PM

## 2019-12-08 NOTE — Progress Notes (Signed)
Adult Psychoeducational Group Note  Date:  12/08/2019 Time:  4:15 AM  Group Topic/Focus:  Wrap-Up Group:   The focus of this group is to help patients review their daily goal of treatment and discuss progress on daily workbooks.  Participation Level:  Did Not Attend  Participation Quality:  Did Not Attend  Affect:  Did Not Attend  Cognitive:  Did Not Attend  Insight: None  Engagement in Group:  Did Not Attend  Modes of Intervention:  Did Not Attend  Additional Comments:  Did not attend evening wrap up group tonight.  Felipa Furnace 12/08/2019, 4:15 AM

## 2019-12-08 NOTE — Progress Notes (Signed)
Patient has been observed pacing up and down the hallway and into the dayroom since change of shift. He has not shown any signs of aggression but potentially could become an issue. Writer offered him medication to help him relax and he wanted to try it. He received ativan. Will monitor effectiveness of medication given. Safety maintained with 15 min checks.

## 2019-12-08 NOTE — Progress Notes (Signed)
   12/08/19 0705  Vital Signs  Temp 98 F (36.7 C)  Temp Source Oral  Pulse Rate 88  Pulse Rate Source Dinamap  Resp 16  BP 126/81  BP Location Left Arm  BP Method Automatic  Patient Position (if appropriate) Lying  Oxygen Therapy  SpO2 99 %   D: Patient denies SI/HI/AVH. Pt. Denies anxiety and depression. Pt. Was isolative in the morning, but started pacing in the hall in the afternoon. A:  Patient took scheduled medicine.  Support and encouragement provided Routine safety checks conducted every 15 minutes. Patient  Informed to notify staff with any concerns.   R: Safety maintained.

## 2019-12-08 NOTE — Progress Notes (Signed)
D: Patient presents with irritable and anxious affect. Patient can be seen pacing the hallways. Patient denies SI/HI at this time. Patient also denies AH/VH at this time. Patient aware to notify staff or RN if this changes.  A: Provided positive reinforcement and encouragement. R: Patient not receptive to efforts.  [place] NOVEL CORONAVIRUS (COVID-19) DAILY CHECK-OFF SYMPTOMS - answer yes or no to each - every day NO YES  Have you had a fever in the past 24 hours?   Fever (Temp > 37.80C / 100F) X    Have you had any of these symptoms in the past 24 hours?  New Cough   Sore Throat    Shortness of Breath   Difficulty Breathing   Unexplained Body Aches   X    Have you had any one of these symptoms in the past 24 hours not related to allergies?    Runny Nose   Nasal Congestion   Sneezing   X    If you have had runny nose, nasal congestion, sneezing in the past 24 hours, has it worsened?   X    EXPOSURES - check yes or no X    Have you traveled outside the state in the past 14 days?   X    Have you been in contact with someone with a confirmed diagnosis of COVID-19 or PUI in the past 14 days without wearing appropriate PPE?   X    Have you been living in the same home as a person with confirmed diagnosis of COVID-19 or a PUI (household contact)?     X    Have you been diagnosed with COVID-19?     X                                                                                                                             What to do next: Answered NO to all: Answered YES to anything:    Proceed with unit schedule Follow the [facility] Inpatient Flowsheet.

## 2019-12-08 NOTE — Progress Notes (Signed)
   12/07/19 2040  COVID-19 Daily Checkoff  Have you had a fever (temp > 37.80C/100F)  in the past 24 hours?  No  If you have had runny nose, nasal congestion, sneezing in the past 24 hours, has it worsened? No  COVID-19 EXPOSURE  Have you traveled outside the state in the past 14 days? No  Have you been in contact with someone with a confirmed diagnosis of COVID-19 or PUI in the past 14 days without wearing appropriate PPE? No  Have you been living in the same home as a person with confirmed diagnosis of COVID-19 or a PUI (household contact)? No  Have you been diagnosed with COVID-19? No   

## 2019-12-09 LAB — TSH: TSH: 4.313 u[IU]/mL (ref 0.350–4.500)

## 2019-12-09 LAB — COMPREHENSIVE METABOLIC PANEL
ALT: 44 U/L (ref 0–44)
AST: 32 U/L (ref 15–41)
Albumin: 4.3 g/dL (ref 3.5–5.0)
Alkaline Phosphatase: 36 U/L — ABNORMAL LOW (ref 38–126)
Anion gap: 8 (ref 5–15)
BUN: 15 mg/dL (ref 6–20)
CO2: 25 mmol/L (ref 22–32)
Calcium: 9.4 mg/dL (ref 8.9–10.3)
Chloride: 102 mmol/L (ref 98–111)
Creatinine, Ser: 1.03 mg/dL (ref 0.61–1.24)
GFR, Estimated: >60 mL/min (ref >60)
Glucose, Bld: 94 mg/dL (ref 70–99)
Potassium: 4.2 mmol/L (ref 3.5–5.1)
Sodium: 135 mmol/L (ref 135–145)
Total Bilirubin: 0.9 mg/dL (ref 0.3–1.2)
Total Protein: 7.5 g/dL (ref 6.5–8.1)

## 2019-12-09 LAB — LITHIUM LEVEL: Lithium Lvl: 0.34 mmol/L — ABNORMAL LOW (ref 0.60–1.20)

## 2019-12-09 MED ORDER — LITHIUM CARBONATE ER 450 MG PO TBCR
450.0000 mg | EXTENDED_RELEASE_TABLET | Freq: Every day | ORAL | 0 refills | Status: DC
Start: 2019-12-09 — End: 2021-04-02

## 2019-12-09 MED ORDER — BENZTROPINE MESYLATE 1 MG PO TABS
1.0000 mg | ORAL_TABLET | Freq: Two times a day (BID) | ORAL | 0 refills | Status: DC
Start: 2019-12-09 — End: 2020-10-03

## 2019-12-09 MED ORDER — LITHIUM CARBONATE ER 450 MG PO TBCR
450.0000 mg | EXTENDED_RELEASE_TABLET | Freq: Every day | ORAL | Status: DC
  Filled 2019-12-09: qty 1

## 2019-12-09 MED ORDER — HYDROXYZINE HCL 25 MG PO TABS
25.0000 mg | ORAL_TABLET | Freq: Three times a day (TID) | ORAL | 0 refills | Status: DC | PRN
Start: 2019-12-09 — End: 2021-04-02

## 2019-12-09 MED ORDER — THIAMINE HCL 100 MG PO TABS: 100.0000 mg | ORAL_TABLET | Freq: Every day | ORAL | 0 refills | Status: DC

## 2019-12-09 MED ORDER — HALOPERIDOL 10 MG PO TABS
10.0000 mg | ORAL_TABLET | Freq: Every day | ORAL | 0 refills | Status: DC
Start: 2019-12-09 — End: 2020-10-03

## 2019-12-09 MED ORDER — TRAZODONE HCL 50 MG PO TABS
50.0000 mg | ORAL_TABLET | Freq: Every evening | ORAL | 0 refills | Status: DC | PRN
Start: 2019-12-09 — End: 2021-04-02

## 2019-12-09 MED ORDER — FOLIC ACID 1 MG PO TABS: 1.0000 mg | ORAL_TABLET | Freq: Every day | ORAL | 0 refills | Status: DC

## 2019-12-09 NOTE — [facility] Suicide Risk Assessment (Signed)
[facility] Discharge Suicide Risk Assessment   Principal Problem: <principal problem not specified> Discharge Diagnoses: Active Problems:   Schizoaffective disorder (HCC)   Psychosis (HCC)   Total Time spent with patient: 20 minutes  Musculoskeletal: Strength & Muscle Tone: within normal limits Gait & Station: normal Patient leans: N/A  Psychiatric Specialty Exam: Review of Systems  All other systems reviewed and are negative.   Blood pressure 98/61, pulse (!) 113, temperature 97.8 F (36.6 C), temperature source Oral, resp. rate 18, height 5\' 11"  (1.803 m), weight 66.7 kg, SpO2 99 %.Body mass index is 20.5 kg/m.  General Appearance: Casual  Eye Contact::  Fair  Speech:  Normal Rate409  Volume:  Normal  Mood:  Euthymic  Affect:  Congruent  Thought Process:  Goal Directed and Descriptions of Associations: Circumstantial  Orientation:  Full (Time, Place, and Person)  Thought Content:  Negative  Suicidal Thoughts:  No  Homicidal Thoughts:  No  Memory:  Immediate;   Fair Recent;   Fair Remote;   Fair  Judgement:  Intact  Insight:  Fair  Psychomotor Activity:  Normal  Concentration:  Fair  Recall:  [phone] of Knowledge:Fair  Language: Fair  Akathisia:  Negative  Handed:  Right  AIMS (if indicated):     Assets:  Desire for Improvement Housing Resilience  Sleep:  Number of Hours: 4.5  Cognition: WNL  ADL's:  Intact   Mental Status Per Nursing Assessment::   On Admission:  NA  Demographic Factors:  Male, Low socioeconomic status and Unemployed  Loss Factors: NA  Historical Factors: Impulsivity  Risk Reduction Factors:   Living with another person, especially a relative and Positive social support  Continued Clinical Symptoms:  Schizophrenia:   Less than 44 years old Paranoid or undifferentiated type  Cognitive Features That Contribute To Risk:  Thought constriction (tunnel vision)    Suicide Risk:  Minimal: No identifiable suicidal ideation.  Patients  presenting with no risk factors but with morbid ruminations; may be classified as minimal risk based on the severity of the depressive symptoms    Plan Of Care/Follow-up recommendations:  Activity:  ad lib  43, MD 12/09/2019, 7:46 AM

## 2019-12-09 NOTE — Tx Team (Signed)
Interdisciplinary Treatment and Diagnostic Plan Update  12/09/2019 Time of Session: 9:45am Nathan Barnett. MRN: [id number]  Principal Diagnosis: <principal problem not specified>  Secondary Diagnoses: Active Problems:   Schizoaffective disorder (HCC)   Psychosis (HCC)   Current Medications:  Current Facility-Administered Medications  Medication Dose Route Frequency Provider Last Rate Last Admin  . acetaminophen (TYLENOL) tablet 650 mg  650 mg Oral Q6H PRN Antonieta Pert, MD      . alum & mag hydroxide-simeth (MAALOX/MYLANTA) 200-200-20 MG/5ML suspension 30 mL  30 mL Oral Q4H PRN Antonieta Pert, MD      . benztropine (COGENTIN) tablet 1 mg  1 mg Oral BID Antonieta Pert, MD   1 mg at 12/09/19 0919  . folic acid (FOLVITE) tablet 1 mg  1 mg Oral Daily Antonieta Pert, MD   1 mg at 12/09/19 0919  . haloperidol (HALDOL) tablet 10 mg  10 mg Oral QHS Antonieta Pert, MD   10 mg at 12/08/19 2040  . hydrOXYzine (ATARAX/VISTARIL) tablet 25 mg  25 mg Oral TID PRN Antonieta Pert, MD   25 mg at 12/07/19 2027  . lithium carbonate (ESKALITH) CR tablet 450 mg  450 mg Oral QHS Aldean Baker, NP      . OLANZapine zydis (ZYPREXA) disintegrating tablet 10 mg  10 mg Oral Q8H PRN Antonieta Pert, MD   10 mg at 12/07/19 1706   And  . LORazepam (ATIVAN) tablet 1 mg  1 mg Oral Q6H PRN Antonieta Pert, MD       And  . ziprasidone (GEODON) injection 20 mg  20 mg Intramuscular Q6H PRN Antonieta Pert, MD      . LORazepam (ATIVAN) tablet 1 mg  1 mg Oral Q6H PRN Antonieta Pert, MD   1 mg at 12/08/19 2231  . magnesium hydroxide (MILK OF MAGNESIA) suspension 30 mL  30 mL Oral Daily PRN Antonieta Pert, MD      . thiamine tablet 100 mg  100 mg Oral Daily Antonieta Pert, MD   100 mg at 12/09/19 0919  . traZODone (DESYREL) tablet 50 mg  50 mg Oral QHS PRN,MR X 1 [name] A, NP   50 mg at 12/08/19 2231   PTA Medications: Medications Prior to Admission  Medication  Sig Dispense Refill Last Dose  . haloperidol (HALDOL) 10 MG tablet Take 1 tablet (10 mg total) by mouth at bedtime. (Patient not taking: Reported on 12/01/2019) 30 tablet 0   . haloperidol decanoate (HALDOL DECANOATE) 100 MG/ML injection Inject 1 mL (100 mg total) into the muscle every 28 (twenty-eight) days. (Patient not taking: Reported on 12/01/2019) 1 mL    . lithium carbonate (LITHOBID) 300 MG CR tablet Take 1 tablet (300 mg total) by mouth at bedtime. (Patient not taking: Reported on 12/01/2019) 30 tablet 0     Patient Stressors: Marital or family conflict Medication change or noncompliance  Patient Strengths: Geographical information systems officer for treatment/growth  Treatment Modalities: Medication Management, Group therapy, Case management,  1 to 1 session with clinician, Psychoeducation, Recreational therapy.   Physician Treatment Plan for Primary Diagnosis: <principal problem not specified> Long Term Goal(s): Improvement in symptoms so as ready for discharge Improvement in symptoms so as ready for discharge   Short Term Goals: Ability to identify changes in lifestyle to reduce recurrence of condition will improve Ability to verbalize feelings will improve Ability to demonstrate self-control will improve Ability to identify  and develop effective coping behaviors will improve Ability to maintain clinical measurements within normal limits will improve Compliance with prescribed medications will improve Ability to identify triggers associated with substance abuse/mental health issues will improve Ability to identify changes in lifestyle to reduce recurrence of condition will improve Ability to verbalize feelings will improve Ability to demonstrate self-control will improve Ability to identify and develop effective coping behaviors will improve Ability to maintain clinical measurements within normal limits will improve Compliance with prescribed medications will improve Ability to  identify triggers associated with substance abuse/mental health issues will improve  Medication Management: Evaluate patient's response, side effects, and tolerance of medication regimen.  Therapeutic Interventions: 1 to 1 sessions, Unit Group sessions and Medication administration.  Evaluation of Outcomes: Adequate for Discharge  Physician Treatment Plan for Secondary Diagnosis: Active Problems:   Schizoaffective disorder (HCC)   Psychosis (HCC)  Long Term Goal(s): Improvement in symptoms so as ready for discharge Improvement in symptoms so as ready for discharge   Short Term Goals: Ability to identify changes in lifestyle to reduce recurrence of condition will improve Ability to verbalize feelings will improve Ability to demonstrate self-control will improve Ability to identify and develop effective coping behaviors will improve Ability to maintain clinical measurements within normal limits will improve Compliance with prescribed medications will improve Ability to identify triggers associated with substance abuse/mental health issues will improve Ability to identify changes in lifestyle to reduce recurrence of condition will improve Ability to verbalize feelings will improve Ability to demonstrate self-control will improve Ability to identify and develop effective coping behaviors will improve Ability to maintain clinical measurements within normal limits will improve Compliance with prescribed medications will improve Ability to identify triggers associated with substance abuse/mental health issues will improve     Medication Management: Evaluate patient's response, side effects, and tolerance of medication regimen.  Therapeutic Interventions: 1 to 1 sessions, Unit Group sessions and Medication administration.  Evaluation of Outcomes: Adequate for Discharge   RN Treatment Plan for Primary Diagnosis: <principal problem not specified> Long Term Goal(s): Knowledge of disease  and therapeutic regimen to maintain health will improve  Short Term Goals: Ability to verbalize frustration and anger appropriately will improve, Ability to demonstrate self-control, Ability to participate in decision making will improve and Compliance with prescribed medications will improve  Medication Management: RN will administer medications as ordered by provider, will assess and evaluate patient's response and provide education to patient for prescribed medication. RN will report any adverse and/or side effects to prescribing provider.  Therapeutic Interventions: 1 on 1 counseling sessions, Psychoeducation, Medication administration, Evaluate responses to treatment, Monitor vital signs and CBGs as ordered, Perform/monitor CIWA, COWS, AIMS and Fall Risk screenings as ordered, Perform wound care treatments as ordered.  Evaluation of Outcomes: Adequate for Discharge   LCSW Treatment Plan for Primary Diagnosis: <principal problem not specified> Long Term Goal(s): Safe transition to appropriate next level of care at discharge, Engage patient in therapeutic group addressing interpersonal concerns.  Short Term Goals: Engage patient in aftercare planning with referrals and resources, Increase ability to appropriately verbalize feelings, Increase emotional regulation and Increase skills for wellness and recovery  Therapeutic Interventions: Assess for all discharge needs, 1 to 1 time with Social worker, Explore available resources and support systems, Assess for adequacy in community support network, Educate family and significant other(s) on suicide prevention, Complete Psychosocial Assessment, Interpersonal group therapy.  Evaluation of Outcomes: Adequate for Discharge   Progress in Treatment: Attending groups: No. Participating in groups: No.  Taking medication as prescribed: Yes. Toleration medication: Yes. Family/Significant other contact made: Yes, individual(s) contacted:  Mother   Patient understands diagnosis: Yes. Discussing patient identified problems/goals with staff: Yes. Medical problems stabilized or resolved: Yes. Denies suicidal/homicidal ideation: Yes. Issues/concerns per patient self-inventory: No.   New problem(s) identified: No, Describe:  None  New Short Term/Long Term Goal(s): medication stabilization, elimination of SI thoughts, development of comprehensive mental wellness plan.   Patient Goals:  "Try to get some sleep while I'm here."  Discharge Plan or Barriers: Pt will return home and follow up with local mental health providers for therapy and medication management.   Reason for Continuation of Hospitalization: Medication stabilization  Estimated Length of Stay: Adequate for discharge   Attendees: Patient: Nathan Barnett 12/09/2019   Physician:  12/09/2019   Nursing:  12/09/2019   RN Care Manager: Marciano Sequin, NP 12/09/2019   Social Worker: Melba Coon, LCSW 12/09/2019   Recreational Therapist:  12/09/2019   Other:  12/09/2019   Other:  12/09/2019   Other: 12/09/2019      Scribe for Treatment Team: Aram Beecham, LCSWA 12/09/2019 10:40 AM

## 2019-12-09 NOTE — Discharge Summary (Signed)
Physician Discharge Summary Note  Patient:  Nathan Barnett. is an 29 y.o., male MRN:  [id number] DOB:  04-25-1990 Patient phone:  [phone] (home)  Patient address:   [address] [place] [address],  Total Time spent with patient: 15 minutes  Date of Admission:  12/03/2019 Date of Discharge: 12/09/19  Reason for Admission: psychosis  Principal Problem: <principal problem not specified> Discharge Diagnoses: Active Problems:   Schizoaffective disorder (HCC)   Psychosis (HCC)   Past Psychiatric History: He is a poor historian so it is difficult to get any real details. His last psychiatric hospitalization within our system was in April 2019.  Past Medical History: History reviewed. No pertinent past medical history. History reviewed. No pertinent surgical history. Family History: History reviewed. No pertinent family history. Family Psychiatric  History: Denies Social History:  Social History   Substance and Sexual Activity  Alcohol Use Yes     Social History   Substance and Sexual Activity  Drug Use Not Currently    Social History   Socioeconomic History  . Marital status: Single    Spouse name: Not on file  . Number of children: Not on file  . Years of education: Not on file  . Highest education level: Not on file  Occupational History  . Not on file  Tobacco Use  . Smoking status: Current Some Day Smoker    Packs/day: 1.00    Years: 2.00    Pack years: 2.00    Types: Cigarettes  . Smokeless tobacco: Never Used  Vaping Use  . Vaping Use: Never used  Substance and Sexual Activity  . Alcohol use: Yes  . Drug use: Not Currently  . Sexual activity: Not Currently  Other Topics Concern  . Not on file  Social History Narrative  . Not on file   Social Determinants of Health   Financial Resource Strain:   . Difficulty of Paying Living Expenses: Not on file  Food Insecurity:   . Worried About Programme researcher, broadcasting/film/video in the Last Year: Not on file  .  Ran Out of Food in the Last Year: Not on file  Transportation Needs:   . Lack of Transportation (Medical): Not on file  . Lack of Transportation (Non-Medical): Not on file  Physical Activity:   . Days of Exercise per Week: Not on file  . Minutes of Exercise per Session: Not on file  Stress:   . Feeling of Stress : Not on file  Social Connections:   . Frequency of Communication with Friends and Family: Not on file  . Frequency of Social Gatherings with Friends and Family: Not on file  . Attends Religious Services: Not on file  . Active Member of Clubs or Organizations: Not on file  . Attends Banker Meetings: Not on file  . Marital Status: Not on file    Hospital Course:  From admission H&P: Patient is a 29 year old male with a reported past psychiatric history with schizophrenia who presented to the [facility] on 11/30/2019 with his mother. His mother was seeking an evaluation. The patient had been diagnosed with schizophrenia in the past. Recently he had been living near [place], [place] and had a job, but recently lost it and had been staying "here and there". The patient's brother lives in [place] as well, and I called the patient's mother to let her know that he was not doing well. He had apparently not been on medications for  quite a while. He is a poor historian, but stated that he is taking medications in the hospital, but would not take them after discharge. He stated that there have been some kind of an argument in the home, but would not go into detail. The mother did tell the evaluation team that he will be fine 1 minute, and then will get angry and "punch walls". The patient did admit that he had a poor appetite, and was not sleeping well. He denied any auditory or visual hallucinations. He denied any gross paranoid thinking. His last psychiatric hospitalization within our system was in April and 2019. He was diagnosed with  schizophrenia at that time. Also, at that time he denied all symptoms as he does with me. Social work in the emergency department at that time received collateral from the mother that stated that his mental state had changed in the last several years. The family initially thought it was secondary to alcohol use, but he got into an alcohol treatment center and was told there that there was a primary mental illness as well. He had been at a [facility] and was on medications, but stopped them. At that time he had also been going to neighbors houses and arguing with them because he felt as though they were talking about him. He was hospitalized there for 8 days, and his discharge medications included haloperidol 10 mg p.o. nightly, haloperidol decanoate 100 mg IM q. 28 days and lithium carbonate 300 mg p.o. nightly. He was admitted to the hospital for evaluation and stabilization.  Mr. Eakins was admitted for psychosis as described above. He remained on the [facility] unit for six days. He was started on Haldol, lithium, and Cogentin. He participated in group therapy on the unit. He responded well to treatment with no adverse effects reported. He has shown improved mood, affect, sleep, and interaction. He denies any SI/HI/AVH and contracts for safety. He is discharging on the medications listed below. He declined referrals for follow-up, stating that he preferred to pursue follow-up appointments on his own . Patient is provided with prescriptions for medications upon discharge. His mother is picking him up for discharge home.  Physical Findings: AIMS: Facial and Oral Movements Muscles of Facial Expression: None, normal Lips and Perioral Area: None, normal Jaw: None, normal Tongue: None, normal,Extremity Movements Upper (arms, wrists, hands, fingers): None, normal Lower (legs, knees, ankles, toes): None, normal, Trunk Movements Neck, shoulders, hips: None, normal, Overall Severity Severity of  abnormal movements (highest score from questions above): None, normal Incapacitation due to abnormal movements: None, normal Patient's awareness of abnormal movements (rate only patient's report): No Awareness, Dental Status Current problems with teeth and/or dentures?: No Does patient usually wear dentures?: No  CIWA:  CIWA-Ar Total: 0 COWS:  COWS Total Score: 0  Musculoskeletal: Strength & Muscle Tone: within normal limits Gait & Station: normal Patient leans: N/A  Psychiatric Specialty Exam: Physical Exam Vitals and nursing note reviewed.  Constitutional:      Appearance: He is well-developed.  Cardiovascular:     Rate and Rhythm: Normal rate.  Pulmonary:     Effort: Pulmonary effort is normal.  Neurological:     Mental Status: He is alert and oriented to person, place, and time.     Review of Systems  Constitutional: Negative.   Respiratory: Negative for cough and shortness of breath.   Psychiatric/Behavioral: Negative for agitation, behavioral problems, confusion, decreased concentration, dysphoric mood, hallucinations, self-injury, sleep disturbance and suicidal ideas. The  patient is not nervous/anxious and is not hyperactive.     Blood pressure 98/61, pulse (!) 113, temperature 97.8 F (36.6 C), temperature source Oral, resp. rate 18, height 5\' 11"  (1.803 m), weight 66.7 kg, SpO2 99 %.Body mass index is 20.5 kg/m.  See MD's discharge SRA     Have you used any form of tobacco in the last 30 days? (Cigarettes, Smokeless Tobacco, Cigars, and/or Pipes): Yes  Has this patient used any form of tobacco in the last 30 days? (Cigarettes, Smokeless Tobacco, Cigars, and/or Pipes)  No  Blood Alcohol level:  Lab Results  Component Value Date   ETH 219 (H) 11/30/2019   ETH <10 06/24/2017    Metabolic Disorder Labs:  Lab Results  Component Value Date   HGBA1C 4.8 12/03/2019   MPG 91.06 12/03/2019   MPG 85.32 06/27/2017   Lab Results  Component Value Date   PROLACTIN  25.4 (H) 12/04/2019   Lab Results  Component Value Date   CHOL 157 12/03/2019   TRIG 135 12/03/2019   HDL 68 12/03/2019   CHOLHDL 2.3 12/03/2019   VLDL 27 12/03/2019   LDLCALC 62 12/03/2019   LDLCALC 77 06/27/2017    See Psychiatric Specialty Exam and Suicide Risk Assessment completed by Attending Physician prior to discharge.  Discharge destination:  Home  Is patient on multiple antipsychotic therapies at discharge:  No   Has Patient had three or more failed trials of antipsychotic monotherapy by history:  No  Recommended Plan for Multiple Antipsychotic Therapies: NA  Discharge Instructions    Discharge instructions   Complete by: As directed    Activity as tolerated. Diet as recommended by primary care physician. Keep all scheduled follow-up appointments as recommended.     Allergies as of 12/09/2019   No Known Allergies     Medication List    STOP taking these medications   haloperidol decanoate 100 MG/ML injection Commonly known as: HALDOL DECANOATE     TAKE these medications     Indication  benztropine 1 MG tablet Commonly known as: COGENTIN Take 1 tablet (1 mg total) by mouth 2 (two) times daily.  Indication: Extrapyramidal Reaction caused by Medications   folic acid 1 MG tablet Commonly known as: FOLVITE Take 1 tablet (1 mg total) by mouth daily. Start taking on: December 10, 2019  Indication: Supplementation   haloperidol 10 MG tablet Commonly known as: HALDOL Take 1 tablet (10 mg total) by mouth at bedtime.  Indication: Schizophrenia   hydrOXYzine 25 MG tablet Commonly known as: ATARAX/VISTARIL Take 1 tablet (25 mg total) by mouth 3 (three) times daily as needed for anxiety.  Indication: Feeling Anxious   lithium carbonate 450 MG CR tablet Commonly known as: ESKALITH Take 1 tablet (450 mg total) by mouth at bedtime. What changed:   medication strength  how much to take  Indication: Manic-Depression   thiamine 100 MG tablet Take 1  tablet (100 mg total) by mouth daily. Start taking on: December 10, 2019  Indication: Supplementation   traZODone 50 MG tablet Commonly known as: DESYREL Take 1 tablet (50 mg total) by mouth at bedtime as needed and may repeat dose one time if needed for sleep.  Indication: Trouble Sleeping        Follow-up recommendations: Activity as tolerated. Diet as recommended by primary care physician. Keep all scheduled follow-up appointments as recommended.   Comments:   Patient is instructed to take all prescribed medications as recommended. Report any side effects or adverse  reactions to your outpatient psychiatrist. Patient is instructed to abstain from alcohol and illegal drugs while on prescription medications. In the event of worsening symptoms, patient is instructed to call the crisis hotline, 911, or go to the nearest emergency department for evaluation and treatment.  Signed: Aldean Baker, NP 12/09/2019, 3:08 PM

## 2019-12-09 NOTE — Progress Notes (Signed)
Recreation Therapy Notes  Date: 10.11.21 Time: 10:00 Location: [address] Day Room  Group Topic:  Goal Planning  Goal Area(s) Addresses:  Patient will be able to set a SMART goals. Patient will identify obstacles to reaching goals  Intervention: Worksheet  Activity: Patients were to identify goals they wish to complete in the next week, month, year and five years.  Patients were to also identify obstacles to reaching goals, what is needed to achieve goals and what they can begin doing to reach goals.  Education:  Discharge Planning, Coping Skills, Goal Planning  Education Outcome: Acknowledges Education/In Group Clarification Provided/Needs Additional Education  Clinical Observations: Pt did not attend group.    Caroll Rancher , LRT/CTRS         Caroll Rancher A 12/09/2019 12:09 PM

## 2019-12-09 NOTE — Progress Notes (Signed)
  [facility] Adult Case Management Discharge Plan :  Will you be returning to the same living situation after discharge:  Yes,  with mother.  At discharge, do you have transportation home?: Yes,  with mother.  Do you have the ability to pay for your medications: No.  Release of information consent forms completed and in the chart;  Patient's signature needed at discharge.  Patient to Follow up at: PT declined follow-up stating that "we have it taken care of.".    Next level of care provider has access to [facility] Link:no  Safety Planning and Suicide Prevention discussed: Yes,  with Tana Conch [phone]   Have you used any form of tobacco in the last 30 days? (Cigarettes, Smokeless Tobacco, Cigars, and/or Pipes): Yes  Has patient been referred to the Quitline?: Patient refused referral  Patient has been referred for addiction treatment: Pt. refused referral  Fredirick Lathe, LCSWA Clinicial Social Worker [employer]

## 2019-12-09 NOTE — [facility] Group Notes (Signed)
Patient did not attend orientation group.  

## 2019-12-09 NOTE — Progress Notes (Signed)
Adult Psychoeducational Group Note  Date:  12/09/2019 Time:  4:26 AM  Group Topic/Focus:  Wrap-Up Group:   The focus of this group is to help patients review their daily goal of treatment and discuss progress on daily workbooks.  Participation Level:  Minimal  Participation Quality:  Intrusive, Inattentive and Resistant  Affect:  Irritable and Resistant  Cognitive:  Disorganized and Confused  Insight: Limited  Engagement in Group:  Limited  Modes of Intervention:  Discussion  Additional Comments:  Pt stated his goal for today was to focus on his treatment plan. Pt stated he accomplished his goal today. Pt stated he was able to talk with his doctor and social worker about his care today. Pt rated his overall day a 10. Pt stated his relationship with his family and support system needs to be improved. Pt stated he was able to contact his Mother today, which improved his day. Pt stated he was able to attend all meals. Pt stated he took all medications provided today. Pt stated he felt better about himself today. Pt stated his appetite was pretty good today. Pt rated sleep last night was pretty good. Pt stated he was no physical pain today. Pt deny auditory or visual hallucinations. Pt denies thoughts of harming himself or others. Pt stated he would alert staff if anything changes.  Felipa Furnace 12/09/2019, 4:26 AM

## 2019-12-09 NOTE — Progress Notes (Signed)
Pt discharged to lobby. Pt was stable and appreciative at that time. All papers, samples and prescriptions were given and valuables returned. Verbal understanding expressed. Denies SI/HI and A/VH. Pt given opportunity to express concerns and ask questions.  

## 2020-01-28 ENCOUNTER — Encounter: Payer: Self-pay | Admitting: Emergency Medicine

## 2020-01-28 ENCOUNTER — Other Ambulatory Visit: Payer: Self-pay

## 2020-01-28 ENCOUNTER — Ambulatory Visit: Payer: Self-pay

## 2020-01-28 ENCOUNTER — Ambulatory Visit
Admission: EM | Admit: 2020-01-28 | Discharge: 2020-01-28 | Disposition: A | Discharge status: Home / Self Care | Payer: Self-pay | Attending: Emergency Medicine | Admitting: Emergency Medicine

## 2020-01-28 DIAGNOSIS — J209 Acute bronchitis, unspecified: Secondary | ICD-10-CM

## 2020-01-28 HISTORY — DX: Nonpsychotic mental disorder, unspecified: F48.9

## 2020-01-28 MED ORDER — AZITHROMYCIN 250 MG PO TABS: 250.0000 mg | ORAL_TABLET | Freq: Every day | ORAL | 0 refills | Status: DC

## 2020-01-28 NOTE — Discharge Instructions (Signed)
Take the antibiotic as directed.  Follow up with your primary care provider if your symptoms are not improving.     

## 2020-01-28 NOTE — ED Triage Notes (Signed)
Patient c/o productive cough w/ "yellow" sputum production and chest congestion x 5 days.   Patient denies fever or pain.   Patient has taken OTC cough and cold medication w/ no symptoms relief.

## 2020-01-28 NOTE — ED Provider Notes (Signed)
Nathan Barnett    CSN: [id number] Arrival date & time: 01/28/20  0947      History   Chief Complaint Chief Complaint  Patient presents with  . Cough    HPI Nathan Barnett. is a 29 y.o. male.   Patient presents with 5-day history of cough productive of yellow sputum and congestion.  He denies fever, sore throat, shortness of breath, vomiting, diarrhea, or other symptoms.  OTC cold medication attempted at home.  He smokes 1 ppd x 2 years.  His medical history includes schizophrenia, schizoaffective disorder, bipolar type, psychosis.  The history is provided by the patient and medical records.    Past Medical History:  Diagnosis Date  . Mental health problem     Patient Active Problem List   Diagnosis Date Noted  . Psychosis (HCC) 12/04/2019  . Schizoaffective disorder (HCC) 12/03/2019  . Schizoaffective disorder, bipolar type (HCC)   . Other neutropenia (HCC)   . Schizophrenia (HCC) 06/26/2017    History reviewed. No pertinent surgical history.     Home Medications    Prior to Admission medications   Medication Sig Start Date End Date Taking? Authorizing Provider  benztropine (COGENTIN) 1 MG tablet Take 1 tablet (1 mg total) by mouth 2 (two) times daily. 12/09/19  Yes Aldean Baker, NP  folic acid (FOLVITE) 1 MG tablet Take 1 tablet (1 mg total) by mouth daily. 12/10/19  Yes Aldean Baker, NP  haloperidol (HALDOL) 10 MG tablet Take 1 tablet (10 mg total) by mouth at bedtime. 12/09/19  Yes Aldean Baker, NP  lithium carbonate (ESKALITH) 450 MG CR tablet Take 1 tablet (450 mg total) by mouth at bedtime. 12/09/19  Yes Aldean Baker, NP  thiamine 100 MG tablet Take 1 tablet (100 mg total) by mouth daily. 12/10/19  Yes Aldean Baker, NP  traZODone (DESYREL) 50 MG tablet Take 1 tablet (50 mg total) by mouth at bedtime as needed and may repeat dose one time if needed for sleep. 12/09/19  Yes Aldean Baker, NP  azithromycin (ZITHROMAX) 250 MG tablet Take  1 tablet (250 mg total) by mouth daily. Take first 2 tablets together, then 1 every day until finished. 01/28/20   Mickie Bail, NP  hydrOXYzine (ATARAX/VISTARIL) 25 MG tablet Take 1 tablet (25 mg total) by mouth 3 (three) times daily as needed for anxiety. 12/09/19   Aldean Baker, NP    Family History History reviewed. No pertinent family history.  Social History Social History   Tobacco Use  . Smoking status: Current Some Day Smoker    Packs/day: 1.00    Years: 2.00    Pack years: 2.00    Types: Cigarettes  . Smokeless tobacco: Never Used  Vaping Use  . Vaping Use: Never used  Substance Use Topics  . Alcohol use: Yes  . Drug use: Not Currently     Allergies   Patient has no known allergies.   Review of Systems Review of Systems  Constitutional: Negative for chills and fever.  HENT: Positive for congestion. Negative for ear pain and sore throat.   Eyes: Negative for pain and visual disturbance.  Respiratory: Positive for cough. Negative for shortness of breath.   Cardiovascular: Negative for chest pain and palpitations.  Gastrointestinal: Negative for abdominal pain, diarrhea and vomiting.  Genitourinary: Negative for dysuria and hematuria.  Musculoskeletal: Negative for arthralgias and back pain.  Skin: Negative for color change and rash.  Neurological: Negative for  seizures and syncope.  All other systems reviewed and are negative.    Physical Exam Triage Vital Signs ED Triage Vitals [01/28/20 0959]  Enc Vitals Group     BP      Pulse      Resp      Temp      Temp src      SpO2      Weight      Height      Head Circumference      Peak Flow      Pain Score 0     Pain Loc      Pain Edu?      Excl. in GC?    No data found.  Updated Vital Signs BP 126/83 (BP Location: Right Arm)   Pulse 84   Temp 98.9 F (37.2 C) (Oral)   Resp 19   SpO2 97%   Visual Acuity Right Eye Distance:   Left Eye Distance:   Bilateral Distance:    Right Eye  Near:   Left Eye Near:    Bilateral Near:     Physical Exam Vitals and nursing note reviewed.  Constitutional:      General: He is not in acute distress.    Appearance: He is well-developed.  HENT:     Head: Normocephalic and atraumatic.     Right Ear: Tympanic membrane normal.     Left Ear: Tympanic membrane normal.     Nose: Nose normal.     Mouth/Throat:     Mouth: Mucous membranes are moist.     Pharynx: Oropharynx is clear.  Eyes:     Conjunctiva/sclera: Conjunctivae normal.  Cardiovascular:     Rate and Rhythm: Normal rate and regular rhythm.     Heart sounds: Normal heart sounds.  Pulmonary:     Effort: Pulmonary effort is normal. No respiratory distress.     Breath sounds: Rhonchi present. No wheezing.     Comments: Scattered rhonchi throughout. Abdominal:     Palpations: Abdomen is soft.     Tenderness: There is no abdominal tenderness. There is no guarding or rebound.  Musculoskeletal:     Cervical back: Neck supple.  Skin:    General: Skin is warm and dry.     Findings: No rash.  Neurological:     General: No focal deficit present.     Mental Status: He is alert and oriented to person, place, and time.     Gait: Gait normal.  Psychiatric:        Mood and Affect: Mood normal.        Behavior: Behavior normal.      UC Treatments / Results  Labs (all labs ordered are listed, but only abnormal results are displayed) Labs Reviewed - No data to display  EKG   Radiology No results found.  Procedures Procedures (including critical care time)  Medications Ordered in UC Medications - No data to display  Initial Impression / Assessment and Plan / UC Course  I have reviewed the triage vital signs and the nursing notes.  Pertinent labs & imaging results that were available during my care of the patient were reviewed by me and considered in my medical decision making (see chart for details).   Acute bronchitis.  Patient declines chest x-ray.  Patient  declines COVID or flu testing.  Treating with Zithromax.  Instructed patient to follow-up with his PCP if his symptoms are not improving.  Patient agrees to plan of  care.   Final Clinical Impressions(s) / UC Diagnoses   Final diagnoses:  Acute bronchitis, unspecified organism     Discharge Instructions     Take the antibiotic as directed.    Follow up with your primary care provider if your symptoms are not improving.       ED Prescriptions    Medication Sig Dispense Auth. Provider   azithromycin (ZITHROMAX) 250 MG tablet Take 1 tablet (250 mg total) by mouth daily. Take first 2 tablets together, then 1 every day until finished. 6 tablet Mickie Bail, NP     PDMP not reviewed this encounter.   Mickie Bail, NP 01/28/20 1024

## 2020-10-02 ENCOUNTER — Other Ambulatory Visit: Payer: Self-pay

## 2020-10-02 DIAGNOSIS — F250 Schizoaffective disorder, bipolar type: Secondary | ICD-10-CM | POA: Insufficient documentation
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
## 2020-10-03 ENCOUNTER — Emergency Department (HOSPITAL_COMMUNITY)
Admission: EM | Admit: 2020-10-03 | Discharge: 2020-10-05 | Disposition: A | Discharge status: Home / Self Care | Payer: Medicaid Other | Attending: Emergency Medicine | Admitting: Emergency Medicine

## 2020-10-03 ENCOUNTER — Ambulatory Visit (HOSPITAL_COMMUNITY)
Admission: EM | Admit: 2020-10-03 | Discharge: 2020-10-03 | Disposition: A | Discharge status: Other Acute Inpatient Hospital | Payer: Federal, State, Local not specified - Other | Attending: Urology | Admitting: Urology

## 2020-10-03 DIAGNOSIS — T56891A Toxic effect of other metals, accidental (unintentional), initial encounter: Secondary | ICD-10-CM | POA: Diagnosis present

## 2020-10-03 DIAGNOSIS — F250 Schizoaffective disorder, bipolar type: Secondary | ICD-10-CM

## 2020-10-03 DIAGNOSIS — F259 Schizoaffective disorder, unspecified: Secondary | ICD-10-CM

## 2020-10-03 DIAGNOSIS — F17210 Nicotine dependence, cigarettes, uncomplicated: Secondary | ICD-10-CM | POA: Insufficient documentation

## 2020-10-03 DIAGNOSIS — Z79899 Other long term (current) drug therapy: Secondary | ICD-10-CM | POA: Insufficient documentation

## 2020-10-03 DIAGNOSIS — Z20822 Contact with and (suspected) exposure to covid-19: Secondary | ICD-10-CM | POA: Insufficient documentation

## 2020-10-03 LAB — COMPREHENSIVE METABOLIC PANEL
ALT: 37 U/L (ref 0–44)
AST: 27 U/L (ref 15–41)
Albumin: 4.2 g/dL (ref 3.5–5.0)
Alkaline Phosphatase: 44 U/L (ref 38–126)
Anion gap: 7 (ref 5–15)
BUN: 8 mg/dL (ref 6–20)
CO2: 24 mmol/L (ref 22–32)
Calcium: 9.6 mg/dL (ref 8.9–10.3)
Chloride: 105 mmol/L (ref 98–111)
Creatinine, Ser: 1.31 mg/dL — ABNORMAL HIGH (ref 0.61–1.24)
GFR, Estimated: >60 mL/min (ref >60)
Glucose, Bld: 88 mg/dL (ref 70–99)
Potassium: 4 mmol/L (ref 3.5–5.1)
Sodium: 136 mmol/L (ref 135–145)
Total Bilirubin: 0.9 mg/dL (ref 0.3–1.2)
Total Protein: 7.1 g/dL (ref 6.5–8.1)

## 2020-10-03 LAB — CBC
HCT: 44.4 % (ref 39.0–52.0)
Hemoglobin: 13.9 g/dL (ref 13.0–17.0)
MCH: 23.3 pg — ABNORMAL LOW (ref 26.0–34.0)
MCHC: 31.3 g/dL (ref 30.0–36.0)
MCV: 74.5 fL — ABNORMAL LOW (ref 80.0–100.0)
Platelets: 365 10*3/uL (ref 150–400)
RBC: 5.96 MIL/uL — ABNORMAL HIGH (ref 4.22–5.81)
RDW: 14 % (ref 11.5–15.5)
WBC: 8.9 10*3/uL (ref 4.0–10.5)
nRBC: 0 % (ref 0.0–0.2)

## 2020-10-03 LAB — RESP PANEL BY RT-PCR (FLU A&B, COVID) ARPGX2
Influenza A by PCR: NEGATIVE
Influenza B by PCR: NEGATIVE
SARS Coronavirus 2 by RT PCR: NEGATIVE

## 2020-10-03 LAB — LITHIUM LEVEL
Lithium Lvl: 1.63 mmol/L (ref 0.60–1.20)
Lithium Lvl: 1.39 mmol/L — ABNORMAL HIGH (ref 0.60–1.20)

## 2020-10-03 LAB — SALICYLATE LEVEL: Salicylate Lvl: <7 mg/dL — ABNORMAL LOW (ref 7.0–30.0)

## 2020-10-03 LAB — RAPID URINE DRUG SCREEN, HOSP PERFORMED
Amphetamines: NOT DETECTED
Barbiturates: NOT DETECTED
Benzodiazepines: NOT DETECTED
Cocaine: NOT DETECTED
Opiates: NOT DETECTED
Tetrahydrocannabinol: NOT DETECTED

## 2020-10-03 LAB — ACETAMINOPHEN LEVEL: Acetaminophen (Tylenol), Serum: <10 ug/mL — ABNORMAL LOW (ref 10–30)

## 2020-10-03 LAB — ETHANOL: Alcohol, Ethyl (B): <10 mg/dL (ref <10)

## 2020-10-03 MED ORDER — LITHIUM CARBONATE ER 300 MG PO TBCR
600.0000 mg | EXTENDED_RELEASE_TABLET | Freq: Every day | ORAL | Status: DC
  Filled 2020-10-03 (×2): qty 2

## 2020-10-03 MED ORDER — HALOPERIDOL 5 MG PO TABS
10.0000 mg | ORAL_TABLET | Freq: Every day | ORAL | Status: DC
  Administered 2020-10-03 – 2020-10-04 (×2): 10 mg via ORAL
  Filled 2020-10-03 (×2): qty 2

## 2020-10-03 MED ORDER — BENZTROPINE MESYLATE 1 MG PO TABS
1.0000 mg | ORAL_TABLET | Freq: Two times a day (BID) | ORAL | Status: DC
  Administered 2020-10-03 – 2020-10-05 (×5): 1 mg via ORAL
  Filled 2020-10-03 (×6): qty 1

## 2020-10-03 MED ORDER — SODIUM CHLORIDE 0.9 % IV BOLUS
1000.0000 mL | Freq: Once | INTRAVENOUS | Status: AC
  Administered 2020-10-03: 1000 mL via INTRAVENOUS

## 2020-10-03 MED ORDER — HYDROXYZINE HCL 25 MG PO TABS
25.0000 mg | ORAL_TABLET | Freq: Three times a day (TID) | ORAL | Status: DC | PRN
  Administered 2020-10-03 – 2020-10-05 (×4): 25 mg via ORAL
  Filled 2020-10-03 (×4): qty 1

## 2020-10-03 NOTE — ED Notes (Signed)
Mother [phone] would like an update

## 2020-10-03 NOTE — ED Provider Notes (Signed)
Patient was seen on morning rounds, was notified of a critical lithium level of 1.63.  Patient had reportedly taken extra lithium approximately a week ago.  He remains calm, cooperative, does not appear altered.  Plan to check EKG, give 1 L fluid bolus, recheck of his lithium as well as Tylenol and salicylate.  Discussed findings with Dr. Lockie Mola, who agrees.  Attempted to call [facility] Poison Control w/ no answer. Repeat Lithium 1.39    Leone Brand 10/14/20 0858    Virgina Norfolk, DO 10/14/20 1535

## 2020-10-03 NOTE — ED Notes (Signed)
Safe transport called for patient to be taken to [facility] ED

## 2020-10-03 NOTE — ED Notes (Signed)
Pt ambulated self to bathroom.  

## 2020-10-03 NOTE — ED Notes (Signed)
Up to b/r, steady gait 

## 2020-10-03 NOTE — ED Notes (Signed)
Pt sitting at chair at bedside. Pt updated on plan of care. Pt denies any needs at this time.

## 2020-10-03 NOTE — ED Notes (Signed)
Denies needs or questions. Declines snack. Updated with plan and movement to h/w stretcher. Appears reserved and guarded.

## 2020-10-03 NOTE — ED Notes (Signed)
Pt multiple times getting up and walking around. Pt redirected back to bed and asked if he needed anything. Pt denies any needs.

## 2020-10-03 NOTE — ED Notes (Signed)
This RN spoke with pt and asked pt about the events leading up to this ED visit. Pt avoided eye contact with RN and said, "It's more of my mom's decision. She thought it would be the best place to go, and with my medicines and stuff." This RN asked pt if he is on medications at home, as well as if pt is keeping up with his home medications. Pt responded "yes" to both questions. Pt will not elaborate further to this RN about his [facility] or ED stay.

## 2020-10-03 NOTE — ED Notes (Signed)
Sudden one-word outburst sharply yelled from room. Denies questions or needs.

## 2020-10-03 NOTE — ED Notes (Signed)
Pt wanded by security. 

## 2020-10-03 NOTE — ED Notes (Signed)
Pt belongings inventoried and placed in [facility] locker #2

## 2020-10-03 NOTE — [facility] Notes (Signed)
Per Dola Argyle, patient meets criteria for inpatient treatment. There are no available or appropriate beds at [facility] today. CSW faxed referrals to the following facilities for review:  [facility]  [place] [facility]  TTS will continue to seek bed placement.  Crissie Reese, MSW, LCSW-A, LCAS-A Phone: [phone] Disposition/TOC
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
## 2020-10-03 NOTE — ED Notes (Signed)
Patient alert and verbal. Patient aware of transfer to [facility]. Patient transported via safe transport to [facility]. Report called to charge nurse Shanda Bumps RN. Patient voices no concerns and family is aware of transfer Patient escorted to meet safe transport driver by staff. All paperwork given to safe transport driver. Marland Kitchen

## 2020-10-03 NOTE — ED Triage Notes (Signed)
Came from [facility] - pt refuses to ellaborate any further. Pt calm and cooperative in triage.

## 2020-10-03 NOTE — ED Notes (Signed)
RN attempted to draw pt's blood for ordered lithium level. Unsuccessful due to pt tremors. Plan to offer PRN hydroxyzine for anxiety. Pt appears agitated but remains cooperative. Phlebotomist has been asked to see pt for blood draw.

## 2020-10-03 NOTE — ED Provider Notes (Signed)
[facility] EMERGENCY DEPARTMENT Provider Note   CSN: [id number] Arrival date & time: 10/03/20  0046     History No chief complaint on file.   [name]. is a 30 y.o. male.  30 year old male with a history of schizoaffective disorder, bipolar type presents from [facility] after mental health evaluation. Encouraged to be assessed by mother who was present at [facility] with the patient. Patient noted to be very reserved, uncooperative with respect to his encounter. Does state that he has been taking his prescribed medications. Has been recommended for inpatient psychiatric placement.  The history is provided by medical records. No language interpreter was used.      Past Medical History:  Diagnosis Date   Mental health problem     Patient Active Problem List   Diagnosis Date Noted   Psychosis (HCC) 12/04/2019   Schizoaffective disorder (HCC) 12/03/2019   Schizoaffective disorder, bipolar type (HCC)    Other neutropenia (HCC)    Schizophrenia (HCC) 06/26/2017    No past surgical history on file.     No family history on file.  Social History   Tobacco Use   Smoking status: Some Days    Packs/day: 1.00    Years: 2.00    Pack years: 2.00    Types: Cigarettes   Smokeless tobacco: Never  Vaping Use   Vaping Use: Never used  Substance Use Topics   Alcohol use: Yes   Drug use: Not Currently    Home Medications Prior to Admission medications   Medication Sig Start Date End Date Taking? Authorizing Provider  benztropine (COGENTIN) 1 MG tablet Take 1 tablet (1 mg total) by mouth 2 (two) times daily. 12/09/19  Yes Aldean Baker, NP  haloperidol (HALDOL) 10 MG tablet Take 1 tablet (10 mg total) by mouth at bedtime. 12/09/19  Yes Aldean Baker, NP  hydrOXYzine (ATARAX/VISTARIL) 25 MG tablet Take 1 tablet (25 mg total) by mouth 3 (three) times daily as needed for anxiety. 12/09/19  Yes Aldean Baker, NP  ARIPiprazole (ABILIFY) 10 MG tablet Take 10  mg by mouth daily. 09/29/20   [provider]  azithromycin (ZITHROMAX) 250 MG tablet Take 1 tablet (250 mg total) by mouth daily. Take first 2 tablets together, then 1 every day until finished. Patient not taking: Reported on 10/03/2020 01/28/20   Mickie Bail, NP  folic acid (FOLVITE) 1 MG tablet Take 1 tablet (1 mg total) by mouth daily. Patient not taking: Reported on 10/03/2020 12/10/19   Aldean Baker, NP  lithium carbonate (ESKALITH) 450 MG CR tablet Take 1 tablet (450 mg total) by mouth at bedtime. Patient not taking: Reported on 10/03/2020 12/09/19   Aldean Baker, NP  lithium carbonate (LITHOBID) 300 MG CR tablet Take 600 mg by mouth at bedtime. 09/29/20   [provider]  mirtazapine (REMERON) 15 MG tablet Take 15 mg by mouth at bedtime. 09/29/20   [provider]  thiamine 100 MG tablet Take 1 tablet (100 mg total) by mouth daily. Patient not taking: Reported on 10/03/2020 12/10/19   Aldean Baker, NP  traZODone (DESYREL) 50 MG tablet Take 1 tablet (50 mg total) by mouth at bedtime as needed and may repeat dose one time if needed for sleep. 12/09/19   Aldean Baker, NP    Allergies    Patient has no known allergies.  Review of Systems   Review of Systems Ten systems reviewed and are negative for acute  change, except as noted in the HPI.    Physical Exam Updated Vital Signs BP (!) 139/94   Pulse 86   Temp (!) 97.5 F (36.4 C) (Oral)   Resp 14   Ht 5\' 11"  (1.803 m)   Wt 66.7 kg   SpO2 100%   BMI 20.51 kg/m   Physical Exam Vitals and nursing note reviewed.  Constitutional:      General: He is not in acute distress.    Appearance: He is well-developed. He is not diaphoretic.  HENT:     Head: Normocephalic and atraumatic.  Eyes:     General: No scleral icterus.    Conjunctiva/sclera: Conjunctivae normal.  Pulmonary:     Effort: Pulmonary effort is normal. No respiratory distress.  Musculoskeletal:        General: Normal range of motion.      Cervical back: Normal range of motion.  Skin:    General: Skin is warm and dry.     Coloration: Skin is not pale.     Findings: No erythema or rash.  Neurological:     Mental Status: He is alert and oriented to person, place, and time.    ED Results / Procedures / Treatments   Labs (all labs ordered are listed, but only abnormal results are displayed) Labs Reviewed  COMPREHENSIVE METABOLIC PANEL - Abnormal; Notable for the following components:      Result Value   Creatinine, Ser 1.31 (*)    All other components within normal limits  CBC - Abnormal; Notable for the following components:   RBC 5.96 (*)    MCV 74.5 (*)    MCH 23.3 (*)    All other components within normal limits  RESP PANEL BY RT-PCR (FLU A&B, COVID) ARPGX2  ETHANOL  RAPID URINE DRUG SCREEN, HOSP PERFORMED    EKG None  Radiology No results found.  Procedures Procedures   Medications Ordered in ED Medications - No data to display  ED Course  I have reviewed the triage vital signs and the nursing notes.  Pertinent labs & imaging results that were available during my care of the patient were reviewed by me and considered in my medical decision making (see chart for details).    MDM Rules/Calculators/A&P                           Seen at [facility] and sent to ED for medical clearance. Patient recommended for inpatient psychiatric placement. Has been calm and cooperative since arrival, but will not participate in much of history. Most information was ascertained from the encounter documented at [facility] prior to arrival.    Final Clinical Impression(s) / ED Diagnoses Final diagnoses:  Schizoaffective disorder, unspecified type [facility])    Rx / DC Orders ED Discharge Orders     None        [facility], PA-C 10/03/20 0405    12/03/20, MD 10/03/20 [phone]

## 2020-10-03 NOTE — ED Notes (Signed)
EDPA rounded. Notified of critical Lithium level high 1.63.

## 2020-10-04 NOTE — ED Notes (Signed)
Updated pts mother.

## 2020-10-04 NOTE — ED Notes (Signed)
Pt continuing to rest in bed, NAD noted. Will obtain vitals when pt is awake

## 2020-10-04 NOTE — ED Notes (Signed)
This RN spoke to Dr. Deretha Emory about pt lithium levels. Told this RN to hold pt PO lithium at this time. Pt continues to pace in hallway. Denies any needs.

## 2020-10-04 NOTE — ED Notes (Signed)
Pt pacing back and forth. Denies any needs. Pt now sitting in chair at bedside.

## 2020-10-04 NOTE — BH Assessment (Signed)
Patient was seen for reassessment.  He continues to be anxious and somewhat guarded.  He states that he is not suicidal of homicidal and he denies any current psychosis.  However, based on his behavior/outbursts since he has arrived to the Emergency Department, patient could benefit from hospitalization for stabilization of his mental health symptoms.

## 2020-10-04 NOTE — ED Notes (Signed)
Patient resting comfortably at this time, will obtain VS when patient wakes up.

## 2020-10-04 NOTE — ED Notes (Signed)
TTS is ready for use, pt is in front of monitor waiting for call.

## 2020-10-04 NOTE — ED Notes (Signed)
TTS in use at this time. 

## 2020-10-04 NOTE — ED Notes (Signed)
Pt made aware than the mother was updated.

## 2020-10-05 ENCOUNTER — Encounter (HOSPITAL_COMMUNITY): Payer: Self-pay | Admitting: Registered Nurse

## 2020-10-05 DIAGNOSIS — F250 Schizoaffective disorder, bipolar type: Secondary | ICD-10-CM

## 2020-10-05 DIAGNOSIS — T56891A Toxic effect of other metals, accidental (unintentional), initial encounter: Secondary | ICD-10-CM | POA: Diagnosis present

## 2020-10-05 LAB — LITHIUM LEVEL: Lithium Lvl: 0.47 mmol/L — ABNORMAL LOW (ref 0.60–1.20)

## 2020-10-05 NOTE — ED Notes (Signed)
Talking to psych via TTS at this time.

## 2020-10-05 NOTE — Consult Note (Signed)
Telepsych Consultation   Reason for Consult: Psychiatric evaluation, medication reaction Referring Physician:  Nelly Rout, MD Location of Patient: [facility] ED Location of Provider: Other: Florinda Marker  Patient Identification: Nathan Barnett. MRN:  [id number] Principal Diagnosis: Lithium toxicity Diagnosis:  Principal Problem:   Lithium toxicity Active Problems:   Schizoaffective disorder, bipolar type (HCC)   Total Time spent with patient: 30 minutes  Subjective:   Nathan Barnett. is a 30 y.o. male patient admitted [facility] ED after present from [facility] where initially presented been brought in by his mother with complaints of patient needing to have his vitals checked and that his medications were not working.  Patient reporting he may have "overloaded on one of them."  HPI:  Nathan Barnett., 30 y.o., male patient seen via tele health by this provider, consulted with Dr. Earlene Plater; and chart reviewed on 10/05/20.  On evaluation Nathan Barnett. reports he is feeling "alright."  Patient reports he was brought to the hospital because he needed "a moment of rehabilitation, so he could feel calm and settle."  Patient denies suicidal/self-harm/homicidal ideation, auditory/visual hallucinations, and paranoia.  Patient reports he is sleeping "fine and eating without any difficulty.  Patient reports he is taking his medications now without any adverse reaction.  Patient asked what happened to make him take 4 to 5 tablets of his lithium and stated "I got tired of being on the edge of my seat.  I was not trying to kill myself."  Patient reporting he took the extra pills because of feeling anxious but not in a suicide attempt.  Patient states "I am feeling better now."   During evaluation Nathan Barnett. is sitting on side of bed in no acute distress.  He is alert, oriented x 4, calm and cooperative.  His mood is euthymic with congruent affect.  He does not appear to be  responding to internal/external stimuli or delusional thoughts.  Patient denies suicidal/self-harm/homicidal ideation, psychosis, and paranoia.  Patient answered question appropriately.   Attempted to contact patient's mother for collateral and inform patient psychiatrically cleared.  Left HIPAA compliant voice message    Past Psychiatric History: Schizoaffective disorder bipolar type  Risk to Self: No Risk to Others: No Prior Inpatient Therapy: Yes Prior Outpatient Therapy: Patient reported he does not have any outpatient psychiatric services at this time.  Reports his medication is prescribed by his primary care provider.  Past Medical History:  Past Medical History:  Diagnosis Date   Mental health problem    History reviewed. No pertinent surgical history. Family History: History reviewed. No pertinent family history. Family Psychiatric  History: Unaware Social History:  Social History   Substance and Sexual Activity  Alcohol Use Yes     Social History   Substance and Sexual Activity  Drug Use Not Currently    Social History   Socioeconomic History   Marital status: Single    Spouse name: Not on file   Number of children: Not on file   Years of education: Not on file   Highest education level: Not on file  Occupational History   Not on file  Tobacco Use   Smoking status: Some Days    Packs/day: 1.00    Years: 2.00    Pack years: 2.00    Types: Cigarettes   Smokeless tobacco: Never  Vaping Use   Vaping Use: Never used  Substance and Sexual Activity   Alcohol use: Yes  Drug use: Not Currently   Sexual activity: Not Currently  Other Topics Concern   Not on file  Social History Narrative   Not on file   Social Determinants of Health   Financial Resource Strain: Not on file  Food Insecurity: Not on file  Transportation Needs: Not on file  Physical Activity: Not on file  Stress: Not on file  Social Connections: Not on file   Additional Social History:     Allergies:  No Known Allergies  Labs:  Results for orders placed or performed during the hospital encounter of 10/03/20 (from the past 48 hour(s))  Lithium level     Status: Abnormal   Collection Time: 10/05/20 10:43 AM  Result Value Ref Range   Lithium Lvl 0.47 (L) 0.60 - 1.20 mmol/L    Comment: Performed at [facility], [address] [place], [place] [address]    Medications:  Current Facility-Administered Medications  Medication Dose Route Frequency Provider Last Rate Last Admin   benztropine (COGENTIN) tablet 1 mg  1 mg Oral BID Antony Madura, PA-C   1 mg at 10/05/20 4431   haloperidol (HALDOL) tablet 10 mg  10 mg Oral QHS Antony Madura, PA-C   10 mg at 10/04/20 2149   hydrOXYzine (ATARAX/VISTARIL) tablet 25 mg  25 mg Oral TID PRN Antony Madura, PA-C   25 mg at 10/05/20 5400   lithium carbonate (LITHOBID) CR tablet 600 mg  600 mg Oral QHS Antony Madura, PA-C       Current Outpatient Medications  Medication Sig Dispense Refill   ARIPiprazole (ABILIFY) 10 MG tablet Take 10 mg by mouth daily. Pt takes with 2 mg to equal 12 mg for a total dose     ARIPiprazole (ABILIFY) 2 MG tablet Take 2 mg by mouth daily. Pt takes with 10 mg for a total dose of 12 mg.     folic acid (FOLVITE) 1 MG tablet Take 1 tablet (1 mg total) by mouth daily. 30 tablet 0   hydrOXYzine (ATARAX/VISTARIL) 25 MG tablet Take 1 tablet (25 mg total) by mouth 3 (three) times daily as needed for anxiety. 30 tablet 0   lithium carbonate (ESKALITH) 450 MG CR tablet Take 1 tablet (450 mg total) by mouth at bedtime. 30 tablet 0   lithium carbonate (LITHOBID) 300 MG CR tablet Take 600 mg by mouth at bedtime.     mirtazapine (REMERON) 15 MG tablet Take 15 mg by mouth at bedtime.     thiamine 100 MG tablet Take 1 tablet (100 mg total) by mouth daily. 30 tablet 0   traZODone (DESYREL) 50 MG tablet Take 1 tablet (50 mg total) by mouth at bedtime as needed and may repeat dose one time if needed for sleep. (Patient not  taking: No sig reported) 30 tablet 0    Musculoskeletal: Strength & Muscle Tone: within normal limits Gait & Station: normal Patient leans: N/A    Psychiatric Specialty Exam:  Presentation  General Appearance: Appropriate for Environment  Eye Contact:Good  Speech:Clear and Coherent  Speech Volume:Normal  Handedness:Right   Mood and Affect  Mood:Euthymic  Affect:Appropriate; Congruent   Thought Process  Thought Processes:Coherent; Goal Directed  Descriptions of Associations:Intact  Orientation:Full (Time, Place and Person)  Thought Content:WDL  History of Schizophrenia/Schizoaffective disorder:Yes  Duration of Psychotic Symptoms:Greater than six months  Hallucinations:No data recorded Ideas of Reference:None  Suicidal Thoughts:Suicidal Thoughts: No  Homicidal Thoughts:Homicidal Thoughts: No   Sensorium  Memory:Immediate Good; Recent Good; Remote Good  Judgment:Intact  Insight:Present   Executive Functions  Concentration:Good  Attention Span:Good  Recall:Good  Fund of Knowledge:Good  Language:Good   Psychomotor Activity  Psychomotor Activity:Psychomotor Activity: Normal   Assets  Assets:Communication Skills; Desire for Improvement; Financial Resources/Insurance; Housing; Physical Health; Resilience; Social Support   Sleep  Sleep:Sleep: Good    Physical Exam: Physical Exam Vitals and nursing note reviewed. Exam conducted with a chaperone present.  Constitutional:      General: He is not in acute distress.    Appearance: Normal appearance. He is not ill-appearing.  Cardiovascular:     Rate and Rhythm: Normal rate.  Pulmonary:     Effort: Pulmonary effort is normal.  Neurological:     Mental Status: He is alert and oriented to person, place, and time.  Psychiatric:        Attention and Perception: Attention and perception normal. He does not perceive auditory or visual hallucinations.        Mood and Affect: Mood and affect  normal.        Speech: Speech normal.        Behavior: Behavior normal. Behavior is not agitated, slowed, aggressive or hyperactive. Behavior is cooperative.        Cognition and Memory: Cognition and memory normal.        Judgment: Judgment normal.   Review of Systems  Constitutional: Negative.   HENT: Negative.    Eyes: Negative.   Respiratory: Negative.    Cardiovascular: Negative.   Gastrointestinal: Negative.   Genitourinary: Negative.   Musculoskeletal: Negative.   Skin: Negative.   Neurological: Negative.   Endo/Heme/Allergies: Negative.   Psychiatric/Behavioral:  Negative for memory loss. Depression: Stable. Hallucinations: Denies. Substance abuse: Denies. Suicidal ideas: Denies.Nervous/anxious: Stable. Insomnia: Denies.   Blood pressure 123/79, pulse 89, temperature 97.8 F (36.6 C), resp. rate 16, height 5\' 11"  (1.803 m), weight 66.7 kg, SpO2 99 %. Body mass index is 20.51 kg/m.  Treatment Plan Summary: Plan psychiatrically clear.  Referral for outpatient psychiatric services for medication management and therapy.  Behavioral health coordinator will attached resources to AVS  Lithium level rechecked current level is 0.47.  Patient has been restarted on his lithium.  Disposition: Psychiatrically clear No evidence of imminent risk to self or others at present.   Patient does not meet criteria for psychiatric inpatient admission. Supportive therapy provided about ongoing stressors. Discussed crisis plan, support from social network, calling 911, coming to the Emergency Department, and calling Suicide Hotline. Referral to outpatient psychiatric services  This service was provided via telemedicine using a 2-way, interactive audio and video technology.  Names of all persons participating in this telemedicine service and their role in this encounter. Name: Role: NP  Name: Assunta Found Role: Psychiatrist  Name: Earlene Plater Role: Patient  Name: Birdena Jubilee, RN Role:   Secure message sent to patient's nurse  informing: Psychiatric consult completed.  Patient psychiatrically cleared.  Lithium level normal.  Referral to outpatient psychiatric services for medication management and therapy.  Behavioral Health Coordinator will add resources to AVS.  Please inform MD only default listed.     Lenetta Piche, NP 10/05/2020 12:17 PM

## 2020-10-05 NOTE — BH Assessment (Signed)
[facility] Assessment Progress Note   Per Shuvon Rankin, NP, this voluntary pt does not require psychiatric hospitalization at this time.  Pt is psychiatrically cleared.  Pt's registration record lists an address in [place], in [place].  Discharge instructions advise pt to follow up with [facility] in [place].  Pt's nurse, Ma Katrina, have been notified.  Doylene Canning, MA Triage Specialist [phone]

## 2020-10-05 NOTE — ED Notes (Signed)
MD is coming to talk to pt.

## 2020-10-05 NOTE — ED Notes (Signed)
Updated pt mother at this time regarding discharge plan. Waiting for psych to finish resources in AVS. Pending dc.

## 2020-10-05 NOTE — ED Notes (Signed)
Patient refused A Snack, and Drink.

## 2020-10-05 NOTE — Discharge Instructions (Signed)
For your behavioral health needs you are advised to follow up with [facility] at your earliest opportunity:       [facility]      [address]      [place], [place] [address]      [phone]

## 2020-10-05 NOTE — ED Notes (Signed)
Pt mother came in to pick up pt.

## 2020-10-05 NOTE — ED Notes (Signed)
Pt is cleared for discharge. RN spoke to pt mom who is coming in 20-30minutes to pick up pt.

## 2020-10-05 NOTE — ED Provider Notes (Signed)
Emergency Medicine Observation Re-evaluation Note  Nathan Barnett. is a 30 y.o. male, seen on rounds today.  Pt initially presented to the ED for complaints of No chief complaint on file. Currently, the patient is restful.  Laying in bed has already eaten breakfast.  Denies any symptoms or concerns.  Physical Exam  BP 137/85   Pulse 85   Temp 98.1 F (36.7 C) (Oral)   Resp 18   Ht 5\' 11"  (1.803 m)   Wt 66.7 kg   SpO2 98%   BMI 20.51 kg/m  Physical Exam CONSTITUTIONAL:  well-appearing, NAD NEURO:  Alert and oriented x 3, no focal deficits EYES:  pupils equal and reactive ENT/NECK:  trachea midline, no JVD CARDIO:  reg rate, reg rhythm, well-perfused PULM:  None labored breathing GI/GU:  Abdomen non-distended MSK/SPINE:  No gross deformities, no edema SKIN:  no rash obvious, atraumatic, no ecchymosis  PSYCH:  Appropriate speech and behavior   ED Course / MDM  EKG:EKG Interpretation  Date/Time:  Saturday October 03 2020 08:23:11 EDT Ventricular Rate:  87 PR Interval:  176 QRS Duration: 86 QT Interval:  368 QTC Calculation: 442 R Axis:   86 Text Interpretation: Normal sinus rhythm Normal ECG When compared with ECG of 12/03/2019, No significant change was found Confirmed by 02/02/2020 (Nathan Barnett) on 10/04/2020 2:16:15 AM  I have reviewed the labs performed to date as well as medications administered while in observation.  Recent changes in the last 24 hours include none.  Plan  Current plan is for Psychiatry reevaluated patient yesterday at 11:09 AM.  Is considering admission for psychiatric illness.  12/04/2020 Nathan Barnett. is not under involuntary commitment.     Nathan Barnett, Nathan Barnett 10/05/20 12/05/20    5053, DO 10/06/20 1745

## 2021-03-17 ENCOUNTER — Ambulatory Visit
Admission: RE | Admit: 2021-03-17 | Discharge: 2021-03-17 | Disposition: A | Discharge status: Home / Self Care | Payer: Self-pay | Source: Ambulatory Visit

## 2021-03-17 VITALS — BP 144/86 | HR 102 | Temp 98.1°F | Resp 18

## 2021-03-17 DIAGNOSIS — L608 Other nail disorders: Secondary | ICD-10-CM

## 2021-03-17 NOTE — Discharge Instructions (Addendum)
Acetone "nail polish removal" applied as directed on the bottle

## 2021-03-17 NOTE — ED Provider Notes (Signed)
[facility]    CSN: [id number] Arrival date & time: 03/17/21  1500      History   Chief Complaint Chief Complaint  Patient presents with   Nail Problem    HPI Katrell Subler. is a 31 y.o. male.   HPI  Nail Problem: Patient states that he has had discoloration of his right pointer finger. He states that this has been present for about 1 month. He is concerned that this is a fungal growth or from smoking cigarettes. He has tried rubbing alcohol for symptoms. He denies injury, pain, discharge or fever.   Past Medical History:  Diagnosis Date   Mental health problem     Patient Active Problem List   Diagnosis Date Noted   Lithium toxicity 10/05/2020   Psychosis ([place]) 12/04/2019   Schizoaffective disorder ([place]) 12/03/2019   Schizoaffective disorder, bipolar type ([place])    Other neutropenia ([place])    Schizophrenia ([place]) 06/26/2017    History reviewed. No pertinent surgical history.     Home Medications    Prior to Admission medications   Medication Sig Start Date End Date Taking? Authorizing Provider  hydrOXYzine (ATARAX/VISTARIL) 25 MG tablet Take 1 tablet (25 mg total) by mouth 3 (three) times daily as needed for anxiety. 12/09/19  Yes Connye Burkitt, NP  mirtazapine (REMERON) 15 MG tablet Take 15 mg by mouth at bedtime. 09/29/20  Yes [provider]  thiamine 100 MG tablet Take 1 tablet (100 mg total) by mouth daily. 12/10/19  Yes Connye Burkitt, NP  ARIPiprazole (ABILIFY) 10 MG tablet Take 10 mg by mouth daily. Pt takes with 2 mg to equal 12 mg for a total dose 09/29/20   [provider]  ARIPiprazole (ABILIFY) 2 MG tablet Take 2 mg by mouth daily. Pt takes with 10 mg for a total dose of 12 mg.    [provider]  benztropine (COGENTIN) 1 MG tablet Take 1 mg by mouth 2 (two) times daily. 03/02/21   [provider]  busPIRone (BUSPAR) 15 MG tablet Take 15 mg by mouth 2 (two) times daily. 02/24/21   [provider]  folic acid (FOLVITE) 1 MG tablet Take 1 tablet (1 mg total) by mouth daily. 12/10/19   Connye Burkitt, NP  lithium carbonate (ESKALITH) 450 MG CR tablet Take 1 tablet (450 mg total) by mouth at bedtime. 12/09/19   Connye Burkitt, NP  lithium carbonate (LITHOBID) 300 MG CR tablet Take 600 mg by mouth at bedtime. 09/29/20   [provider]  traZODone (DESYREL) 50 MG tablet Take 1 tablet (50 mg total) by mouth at bedtime as needed and may repeat dose one time if needed for sleep. Patient not taking: No sig reported 12/09/19   Connye Burkitt, NP  ziprasidone (GEODON) 20 MG capsule Take by mouth. 02/25/21   [provider]    Family History History reviewed. No pertinent family history.  Social History Social History   Tobacco Use   Smoking status: Some Days    Packs/day: 1.00    Years: 2.00    Pack years: 2.00    Types: Cigarettes   Smokeless tobacco: Never  Vaping Use   Vaping Use: Never used  Substance Use Topics   Alcohol use: Yes   Drug use: Not Currently     Allergies   Patient has no known allergies.   Review of Systems Review of Systems  As stated above in HPI Physical Exam  Triage Vital Signs ED Triage Vitals  Enc Vitals Group     BP 03/17/21 1511 (!) 144/86     Pulse Rate 03/17/21 1511 (!) 102     Resp 03/17/21 1511 18     Temp 03/17/21 1511 98.1 F (36.7 C)     Temp src --      SpO2 03/17/21 1511 97 %     Weight --      Height --      Head Circumference --      Peak Flow --      Pain Score 03/17/21 1512 0     Pain Loc --      Pain Edu? --      Excl. in [place]? --    No data found.  Updated Vital Signs BP (!) 144/86 (BP Location: Left Arm)    Pulse (!) 102    Temp 98.1 F (36.7 C)    Resp 18    SpO2 97%   Physical Exam Skin:    Comments: Tan/orange tint of the superficial right pointer fingernail. No edema, erythema or discharge. No hyperkeratosis. No other fingers affected.      UC Treatments / Results  Labs (all labs  ordered are listed, but only abnormal results are displayed) Labs Reviewed - No data to display  EKG   Radiology No results found.  Procedures Procedures (including critical care time)  Medications Ordered in UC Medications - No data to display  Initial Impression / Assessment and Plan / UC Course  I have reviewed the triage vital signs and the nursing notes.  Pertinent labs & imaging results that were available during my care of the patient were reviewed by me and considered in my medical decision making (see chart for details).     New. Appears to be discoloration secondary to his cigarette use. Discussed avoiding use and that the nail likely will need to grow out for coloration to resolve. He can use topical acetone from the nail polish removal section of the drug store to see if this helps. Follow up PRN.  Final Clinical Impressions(s) / UC Diagnoses   Final diagnoses:  None   Discharge Instructions   None    ED Prescriptions   None    PDMP not reviewed this encounter.   Hughie Closs, [place] 03/17/21 1550

## 2021-03-17 NOTE — ED Triage Notes (Signed)
Pt presents with right pointer finger nail discoloration.

## 2021-03-18 ENCOUNTER — Encounter: Payer: Self-pay | Admitting: Emergency Medicine

## 2021-03-18 ENCOUNTER — Other Ambulatory Visit: Payer: Self-pay

## 2021-03-18 ENCOUNTER — Emergency Department
Admission: EM | Admit: 2021-03-18 | Discharge: 2021-03-20 | Disposition: A | Discharge status: Home / Self Care | Payer: 59 | Attending: Emergency Medicine | Admitting: Emergency Medicine

## 2021-03-18 DIAGNOSIS — F29 Unspecified psychosis not due to a substance or known physiological condition: Secondary | ICD-10-CM | POA: Diagnosis present

## 2021-03-18 DIAGNOSIS — F23 Brief psychotic disorder: Secondary | ICD-10-CM

## 2021-03-18 DIAGNOSIS — F250 Schizoaffective disorder, bipolar type: Secondary | ICD-10-CM | POA: Diagnosis not present

## 2021-03-18 DIAGNOSIS — F201 Disorganized schizophrenia: Secondary | ICD-10-CM | POA: Diagnosis not present

## 2021-03-18 DIAGNOSIS — T56891A Toxic effect of other metals, accidental (unintentional), initial encounter: Secondary | ICD-10-CM | POA: Diagnosis present

## 2021-03-18 DIAGNOSIS — Z046 Encounter for general psychiatric examination, requested by authority: Secondary | ICD-10-CM | POA: Insufficient documentation

## 2021-03-18 DIAGNOSIS — F259 Schizoaffective disorder, unspecified: Secondary | ICD-10-CM | POA: Diagnosis not present

## 2021-03-18 DIAGNOSIS — Z20822 Contact with and (suspected) exposure to covid-19: Secondary | ICD-10-CM | POA: Insufficient documentation

## 2021-03-18 DIAGNOSIS — F209 Schizophrenia, unspecified: Secondary | ICD-10-CM | POA: Diagnosis present

## 2021-03-18 LAB — RESP PANEL BY RT-PCR (FLU A&B, COVID) ARPGX2
Influenza A by PCR: NEGATIVE
Influenza B by PCR: NEGATIVE
SARS Coronavirus 2 by RT PCR: NEGATIVE

## 2021-03-18 LAB — LITHIUM LEVEL: Lithium Lvl: 0.13 mmol/L — ABNORMAL LOW (ref 0.60–1.20)

## 2021-03-18 LAB — CBC
HCT: 46.4 % (ref 39.0–52.0)
Hemoglobin: 14.6 g/dL (ref 13.0–17.0)
MCH: 23.3 pg — ABNORMAL LOW (ref 26.0–34.0)
MCHC: 31.5 g/dL (ref 30.0–36.0)
MCV: 74 fL — ABNORMAL LOW (ref 80.0–100.0)
Platelets: 341 10*3/uL (ref 150–400)
RBC: 6.27 MIL/uL — ABNORMAL HIGH (ref 4.22–5.81)
RDW: 14.3 % (ref 11.5–15.5)
WBC: 7.1 10*3/uL (ref 4.0–10.5)
nRBC: 0 % (ref 0.0–0.2)

## 2021-03-18 LAB — COMPREHENSIVE METABOLIC PANEL
ALT: 22 U/L (ref 0–44)
AST: 20 U/L (ref 15–41)
Albumin: 4.5 g/dL (ref 3.5–5.0)
Alkaline Phosphatase: 51 U/L (ref 38–126)
Anion gap: 7 (ref 5–15)
BUN: 11 mg/dL (ref 6–20)
CO2: 25 mmol/L (ref 22–32)
Calcium: 9.2 mg/dL (ref 8.9–10.3)
Chloride: 105 mmol/L (ref 98–111)
Creatinine, Ser: 1.07 mg/dL (ref 0.61–1.24)
GFR, Estimated: >60 mL/min (ref >60)
Glucose, Bld: 83 mg/dL (ref 70–99)
Potassium: 4.2 mmol/L (ref 3.5–5.1)
Sodium: 137 mmol/L (ref 135–145)
Total Bilirubin: 0.6 mg/dL (ref 0.3–1.2)
Total Protein: 7.7 g/dL (ref 6.5–8.1)

## 2021-03-18 LAB — SALICYLATE LEVEL: Salicylate Lvl: <7 mg/dL — ABNORMAL LOW (ref 7.0–30.0)

## 2021-03-18 LAB — ACETAMINOPHEN LEVEL: Acetaminophen (Tylenol), Serum: <10 ug/mL — ABNORMAL LOW (ref 10–30)

## 2021-03-18 LAB — ETHANOL: Alcohol, Ethyl (B): <10 mg/dL (ref <10)

## 2021-03-18 NOTE — ED Triage Notes (Addendum)
Pt in via POV w/ mother, patient lives with mother.  Mother feels that his psych meds may need to be adjusted.  Reports worsening aggressive behavior, loud outbursts, and inappropriate language; mother reports behaviors have gradually worsened over the last 6 months since patient has been taken off of Abilify and put on Ziprasidone.    Patient with some outburst in triage, appears to be talking to himself.  Denies any visual/auditory hallucinations at this time.  Patient dressed out into hospital provided attire; belongings placed in labeled belongings bag and handed off to [employer]. Belongings include:  Electrical engineer Boxers [employer] Shorts Black Sweat Pants Architect

## 2021-03-18 NOTE — ED Provider Notes (Signed)
[facility] Provider Note    Event Date/Time   First MD Initiated Contact with Patient 03/18/21 2120     (approximate)   History   Psychiatric Evaluation   HPI  Phill Mathwig. is a 31 y.o. male with a past history of schizophrenia who was brought to the ED due to increasingly aggressive behavior, impulsive outbursts.  Mother is concerned that this may be due to medication change 6 months ago.  Patient denies any complaints, denies SI HI or hallucinations.  However, he does appear to be responding to internal stimuli and somewhat disorganized thought process.  Patient denies any injuries.     Physical Exam   Triage Vital Signs: ED Triage Vitals [03/18/21 1953]  Enc Vitals Group     BP (!) 158/88     Pulse Rate 90     Resp 17     Temp 98.7 F (37.1 C)     Temp Source Oral     SpO2 95 %     Weight 189 lb (85.7 kg)     Height 6' (1.829 m)     Head Circumference      Peak Flow      Pain Score      Pain Loc      Pain Edu?      Excl. in [place]?     Most recent vital signs: Vitals:   03/18/21 1953  BP: (!) 158/88  Pulse: 90  Resp: 17  Temp: 98.7 F (37.1 C)  SpO2: 95%     General: Awake, no distress.  Disorganized, internal stimuli CV:  Good peripheral perfusion.  Resp:  Normal effort.  Abd:  No distention.  Other:  Sitting upright, full range of motion   ED Results / Procedures / Treatments   Labs (all labs ordered are listed, but only abnormal results are displayed) Labs Reviewed  SALICYLATE LEVEL - Abnormal; Notable for the following components:      Result Value   Salicylate Lvl [id number] (*)    All other components within normal limits  ACETAMINOPHEN LEVEL - Abnormal; Notable for the following components:   Acetaminophen (Tylenol), Serum <10 (*)    All other components within normal limits  LITHIUM LEVEL - Abnormal; Notable for the following components:   Lithium Lvl 0.13 (*)    All other components within normal  limits  CBC - Abnormal; Notable for the following components:   RBC 6.27 (*)    MCV 74.0 (*)    MCH 23.3 (*)    All other components within normal limits  RESP PANEL BY RT-PCR (FLU A&B, COVID) ARPGX2  ETHANOL  COMPREHENSIVE METABOLIC PANEL  URINE DRUG SCREEN, QUALITATIVE ([facility] ONLY)     EKG     RADIOLOGY     PROCEDURES:  Critical Care performed: No  Procedures   MEDICATIONS ORDERED IN ED: Medications - No data to display   IMPRESSION / MDM / [place] / ED COURSE  I reviewed the triage vital signs and the nursing notes.                                   Patient presents with acute psychiatric compensation presenting danger to the patient and others.  IVC initiated.  Labs unremarkable.  Care discussed with psychiatry team who recommends inpatient management.  He is medically stable to proceed.  The patient has been placed  in psychiatric observation due to the need to provide a safe environment for the patient while obtaining psychiatric consultation and evaluation, as well as ongoing medical and medication management to treat the patient's condition.  The patient has been placed under full IVC at this time.       FINAL CLINICAL IMPRESSION(S) / ED DIAGNOSES   Final diagnoses:  Acute psychosis ([place])     Rx / DC Orders   ED Discharge Orders     None        Note:  This document was prepared using Dragon voice recognition software and may include unintentional dictation errors.   Carrie Mew, MD 03/18/21 [id number]

## 2021-03-18 NOTE — BH Assessment (Signed)
Comprehensive Clinical Assessment (CCA) Note  03/18/2021 Nathan Barnett. [id number]  Chief Complaint: Patient is a 31 year old male presenting to [facility] ED under IVC. Per triage note Pt in via POV w/ mother, patient lives with mother.  Mother feels that his psych meds may need to be adjusted.  Reports worsening aggressive behavior, loud outbursts, and inappropriate language; mother reports behaviors have gradually worsened over the last 6 months since patient has been taken off of Abilify and put on Ziprasidone. Patient with some outburst in triage, appears to be talking to himself.  Denies any visual/auditory hallucinations at this time. During assessment patient appears alert and oriented x4, withdrawn, guarded and irritable. Patient does not give much information and only answers yes and no to questions. When asked if he understands why he is here he reports "aren't yall supposed to tell me what to do next." Patient often replies with "I don't give a fuck" to some questions. He does report that he takes medications and is prescribed by his primary care physician and is able to report that he lives with his mother. Patient is also able to deny current SI/HI/AH/VH  Attempted to gain collateral information from patient's mother Gerrit Friends [phone] but no answer  Per Psyc NP Ysidro Evert patient is recommended for Inpatient Chief Complaint  Patient presents with   Psychiatric Evaluation   Visit Diagnosis: Schizoaffective disorder    CCA Screening, Triage and Referral (STR)  Patient Reported Information How did you hear about [name]? Legal System  Referral name: No data recorded Referral phone number: No data recorded  Whom do you see for routine medical problems? No data recorded Practice/Facility Name: No data recorded Practice/Facility Phone Number: No data recorded Name of Contact: No data recorded Contact Number: No data recorded Contact Fax Number: No data  recorded Prescriber Name: No data recorded Prescriber Address (if known): No data recorded  What Is the Reason for Your Visit/Call Today? Patient presents under IVC due to threatening behaviors with family  How Long Has This Been Causing You Problems? > than 6 months  What Do You Feel Would Help You the Most Today? Treatment for Depression or other mood problem   Have You Recently Been in Any Inpatient Treatment (Hospital/Detox/Crisis Center/28-Day Program)? No data recorded Name/Location of Program/Hospital:No data recorded How Long Were You There? No data recorded When Were You Discharged? No data recorded  Have You Ever Received Services From [facility] Before? No data recorded Who Do You See at [facility]? No data recorded  Have You Recently Had Any Thoughts About Hurting Yourself? No  Are You Planning to Commit Suicide/Harm Yourself At This time? No   Have you Recently Had Thoughts About [place]? No  Explanation: No data recorded  Have You Used Any Alcohol or Drugs in the Past 24 Hours? No  How Long Ago Did You Use Drugs or Alcohol? No data recorded What Did You Use and How Much? No data recorded  Do You Currently Have a Therapist/Psychiatrist? Yes  Name of Therapist/Psychiatrist: Unable to report what provider he goes to   Have You Been Recently Discharged From Any Office Practice or Programs? No  Explanation of Discharge From Practice/Program: No data recorded    CCA Screening Triage Referral Assessment Type of Contact: Face-to-Face  Is this Initial or Reassessment? No data recorded Date Telepsych consult ordered in [facility]:  No data recorded Time Telepsych consult ordered in [facility]:  No data recorded  Patient Reported  Information Reviewed? No data recorded Patient Left Without Being Seen? No data recorded Reason for Not Completing Assessment: No data recorded  Collateral Involvement: Mother, Gerrit Friends, provided collateral information   Does  Patient Have a Court Appointed Legal Guardian? No data recorded Name and Contact of Legal Guardian: No data recorded If Minor and Not Living with Parent(s), Who has Custody? No data recorded Is CPS involved or ever been involved? Never  Is APS involved or ever been involved? Never   Patient Determined To Be At Risk for Harm To Self or Others Based on Review of Patient Reported Information or Presenting Complaint? No  Method: No data recorded Availability of Means: No data recorded Intent: No data recorded Notification Required: No data recorded Additional Information for Danger to Others Potential: No data recorded Additional Comments for Danger to Others Potential: No data recorded Are There Guns or Other Weapons in Your Home? No data recorded Types of Guns/Weapons: No data recorded Are These Weapons Safely Secured?                            No data recorded Who Could Verify You Are Able To Have These Secured: No data recorded Do You Have any Outstanding Charges, Pending Court Dates, Parole/Probation? No data recorded Contacted To Inform of Risk of Harm To Self or Others: No data recorded  Location of Assessment: [facility] ED   Does Patient Present under Involuntary Commitment? Yes  IVC Papers Initial File Date: 03/18/21   [place] of Residence: [place]   Patient Currently Receiving the Following Services: Medication Management   Determination of Need: Emergent (2 hours)   Options For Referral: No data recorded    CCA Biopsychosocial Intake/Chief Complaint:  No data recorded Current Symptoms/Problems: No data recorded  Patient Reported Schizophrenia/Schizoaffective Diagnosis in Past: Yes   Strengths: Patient is able to communicate and care for himself  Preferences: No data recorded Abilities: No data recorded  Type of Services Patient Feels are Needed: No data recorded  Initial Clinical Notes/Concerns: No data recorded  Mental Health Symptoms Depression:    Irritability   Duration of Depressive symptoms:  Greater than two weeks   Mania:   None   Anxiety:    Irritability; Restlessness   Psychosis:   Affective flattening/alogia/avolition; Hallucinations   Duration of Psychotic symptoms:  Greater than six months   Trauma:   None   Obsessions:   None   Compulsions:   Poor Insight; "Driven" to perform behaviors/acts   Inattention:   None   Hyperactivity/Impulsivity:   None   Oppositional/Defiant Behaviors:   Aggression towards people/animals; Resentful; Temper   Emotional Irregularity:   None   Other Mood/Personality Symptoms:  No data recorded   Mental Status Exam Appearance and self-care  Stature:   Average   Weight:   Average weight   Clothing:   Casual   Grooming:   Normal   Cosmetic use:   None   Posture/gait:   Tense   Motor activity:   Agitated   Sensorium  Attention:   Normal   Concentration:   Normal   Orientation:   X5   Recall/memory:   Normal   Affect and Mood  Affect:   Anxious; Flat   Mood:   Angry; Irritable   Relating  Eye contact:   Avoided   Facial expression:   Angry; Tense   Attitude toward examiner:   Defensive; Guarded   Thought and Language  Speech flow:  Clear and Coherent   Thought content:   Appropriate to Mood and Circumstances   Preoccupation:   None   Hallucinations:   -- (Patient denies any hallucinations but mother reports patient is experiencing hallucinations)   Organization:  No data recorded  Affiliated Computer ServicesExecutive Functions  Fund of Knowledge:   Fair   Intelligence:   Average   Abstraction:   Normal   Judgement:   Fair   Dance movement psychotherapisteality Testing:   Adequate   Insight:   Lacking; Poor   Decision Making:   Impulsive   Social Functioning  Social Maturity:   Isolates   Social Judgement:   Heedless   Stress  Stressors:   Family conflict   Coping Ability:   Contractorxhausted   Skill Deficits:   None   Supports:   Family      Religion: Religion/Spirituality Are You A Religious Person?: No  Leisure/Recreation: Leisure / Recreation Do You Have Hobbies?: No  Exercise/Diet: Exercise/Diet Do You Exercise?: No Have You Gained or Lost A Significant Amount of Weight in the Past Six Months?: No Do You Follow a Special Diet?: No Do You Have Any Trouble Sleeping?: No   CCA Employment/Education Employment/Work Situation: Employment / Work Situation Employment Situation: Unemployed Patient's Job has Been Impacted by Current Illness: No Has Patient ever Been in Equities traderthe Military?: No  Education: Education Is Patient Currently Attending School?: No Did You Have An Individualized Education Program (IIEP): No Did You Have Any Difficulty At Progress EnergySchool?: No Patient's Education Has Been Impacted by Current Illness: No   CCA Family/Childhood History Family and Relationship History: Family history Marital status: Single Does patient have children?: No  Childhood History:  Childhood History By whom was/is the patient raised?: Both parents Did patient suffer any verbal/emotional/physical/sexual abuse as a child?: No Did patient suffer from severe childhood neglect?: No Has patient ever been sexually abused/assaulted/raped as an adolescent or adult?: No Was the patient ever a victim of a crime or a disaster?: No Witnessed domestic violence?: No Has patient been affected by domestic violence as an adult?: No  Child/Adolescent Assessment:     CCA Substance Use Alcohol/Drug Use: Alcohol / Drug Use Pain Medications: See MAR Prescriptions: See MAR Over the Counter: See MAR History of alcohol / drug use?: Yes Substance #1 Name of Substance 1: Previously reported that patient drinks alcohol                       ASAM's:  Six Dimensions of Multidimensional Assessment  Dimension 1:  Acute Intoxication and/or Withdrawal Potential:      Dimension 2:  Biomedical Conditions and Complications:       Dimension 3:  Emotional, Behavioral, or Cognitive Conditions and Complications:     Dimension 4:  Readiness to Change:     Dimension 5:  Relapse, Continued use, or Continued Problem Potential:     Dimension 6:  Recovery/Living Environment:     ASAM Severity Score:    ASAM Recommended Level of Treatment:     Substance use Disorder (SUD)    Recommendations for Services/Supports/Treatments:    DSM5 Diagnoses: Patient Active Problem List   Diagnosis Date Noted   Lithium toxicity 10/05/2020   Psychosis (HCC) 12/04/2019   Schizoaffective disorder (HCC) 12/03/2019   Schizoaffective disorder, bipolar type (HCC)    Other neutropenia (HCC)    Schizophrenia (HCC) 06/26/2017    Patient Centered Plan: Patient is on the following Treatment Plan(s):  Impulse Control  Referrals to Alternative Service(s): Referred to Alternative Service(s):   Place:   Date:   Time:    Referred to Alternative Service(s):   Place:   Date:   Time:    Referred to Alternative Service(s):   Place:   Date:   Time:    Referred to Alternative Service(s):   Place:   Date:   Time:     Antwan Bribiesca A Icie Kuznicki, LCAS-A

## 2021-03-18 NOTE — ED Notes (Signed)
Patient gives verbal permission for [name] to keep mother, Tana Conch updated with plan of care. She can be reached at [phone].

## 2021-03-18 NOTE — ED Notes (Signed)
Pt reports he is here to be evaluated, states he has been taking his medications as prescribed. Pt does not go into much detail, minimal responses, denies SI, HI, AVH

## 2021-03-19 DIAGNOSIS — F201 Disorganized schizophrenia: Secondary | ICD-10-CM

## 2021-03-19 MED ORDER — BENZTROPINE MESYLATE 1 MG PO TABS
1.0000 mg | ORAL_TABLET | Freq: Two times a day (BID) | ORAL | Status: DC
  Administered 2021-03-19 – 2021-03-20 (×2): 1 mg via ORAL
  Filled 2021-03-19 (×2): qty 1

## 2021-03-19 MED ORDER — ZIPRASIDONE HCL 40 MG PO CAPS
40.0000 mg | ORAL_CAPSULE | Freq: Every day | ORAL | Status: DC
  Administered 2021-03-19: 40 mg via ORAL
  Filled 2021-03-19 (×2): qty 1

## 2021-03-19 MED ORDER — ARIPIPRAZOLE 10 MG PO TABS
10.0000 mg | ORAL_TABLET | Freq: Every day | ORAL | Status: DC
Start: 2021-03-19 — End: 2021-03-20
  Administered 2021-03-19 – 2021-03-20 (×2): 10 mg via ORAL
  Filled 2021-03-19 (×2): qty 1

## 2021-03-19 MED ORDER — HYDROXYZINE PAMOATE 25 MG PO CAPS: 25.0000 mg | ORAL_CAPSULE | Freq: Three times a day (TID) | ORAL | Status: DC | PRN

## 2021-03-19 MED ORDER — ZIPRASIDONE HCL 20 MG PO CAPS
20.0000 mg | ORAL_CAPSULE | Freq: Every day | ORAL | Status: DC
Start: 2021-03-19 — End: 2021-03-20
  Administered 2021-03-20: 20 mg via ORAL
  Filled 2021-03-19 (×2): qty 1

## 2021-03-19 MED ORDER — HYDROXYZINE HCL 25 MG PO TABS
25.0000 mg | ORAL_TABLET | Freq: Three times a day (TID) | ORAL | Status: DC | PRN
  Administered 2021-03-19: 25 mg via ORAL
  Filled 2021-03-19: qty 1

## 2021-03-19 MED ORDER — ZIPRASIDONE HCL 20 MG PO CAPS: 20.0000 mg | ORAL_CAPSULE | Freq: Two times a day (BID) | ORAL | Status: DC

## 2021-03-19 MED ORDER — MIRTAZAPINE 15 MG PO TABS
15.0000 mg | ORAL_TABLET | Freq: Every day | ORAL | Status: DC
  Administered 2021-03-19: 15 mg via ORAL
  Filled 2021-03-19: qty 1

## 2021-03-19 MED ORDER — BUSPIRONE HCL 5 MG PO TABS
15.0000 mg | ORAL_TABLET | Freq: Two times a day (BID) | ORAL | Status: DC
  Administered 2021-03-19 – 2021-03-20 (×2): 15 mg via ORAL
  Filled 2021-03-19 (×2): qty 3

## 2021-03-19 MED ORDER — LITHIUM CARBONATE ER 300 MG PO TBCR
600.0000 mg | EXTENDED_RELEASE_TABLET | Freq: Every day | ORAL | Status: DC
  Administered 2021-03-19: 600 mg via ORAL
  Filled 2021-03-19 (×2): qty 2

## 2021-03-19 MED ORDER — TRAZODONE HCL 50 MG PO TABS
50.0000 mg | ORAL_TABLET | Freq: Once | ORAL | Status: AC
  Administered 2021-03-19: 50 mg via ORAL
  Filled 2021-03-19: qty 1

## 2021-03-19 MED ORDER — ARIPIPRAZOLE 2 MG PO TABS
2.0000 mg | ORAL_TABLET | Freq: Every day | ORAL | Status: DC
Start: 2021-03-19 — End: 2021-03-20
  Administered 2021-03-19 – 2021-03-20 (×2): 2 mg via ORAL
  Filled 2021-03-19 (×2): qty 1

## 2021-03-19 MED ORDER — THIAMINE HCL 100 MG PO TABS
100.0000 mg | ORAL_TABLET | Freq: Every day | ORAL | Status: DC
  Administered 2021-03-19 – 2021-03-20 (×2): 100 mg via ORAL
  Filled 2021-03-19 (×2): qty 1

## 2021-03-19 MED ORDER — LORAZEPAM 2 MG PO TABS
2.0000 mg | ORAL_TABLET | Freq: Once | ORAL | Status: AC
  Administered 2021-03-19: 2 mg via ORAL
  Filled 2021-03-19: qty 1

## 2021-03-19 MED ORDER — FOLIC ACID 1 MG PO TABS
1.0000 mg | ORAL_TABLET | Freq: Every day | ORAL | Status: DC
  Administered 2021-03-19 – 2021-03-20 (×2): 1 mg via ORAL
  Filled 2021-03-19 (×2): qty 1

## 2021-03-19 NOTE — ED Notes (Signed)
Appears to be having visual hallucinations, stomping on ground and banging bed. Pt then walked out to throw trash away and officer attempted to get trash and walk it to trash can for patient, pt then appeared to become agitated but then went back to room.

## 2021-03-19 NOTE — ED Notes (Signed)
Spoke to pt, pt agrees for medication to help sleep. Unsure what he takes at home. Secure chat to Dr. Fuller Plan at this time, will follow up.      Pt remains irritable and guarded

## 2021-03-19 NOTE — ED Notes (Signed)
Pt given sandwich tray and a cup of sprite.

## 2021-03-19 NOTE — ED Notes (Signed)
IVC pending psych inpatient 

## 2021-03-19 NOTE — ED Notes (Signed)
Pt up suddenly from bed, to restroom, no urine sample provided, pt back to bed once finished

## 2021-03-19 NOTE — ED Notes (Signed)
IVC-needs home meds

## 2021-03-19 NOTE — ED Notes (Signed)
Pt remains sitting, again yells out, yells out "no" when asked if he needs anything. Pt refuses any meds for emotions or for relaxing. Will continue to monitor.

## 2021-03-19 NOTE — ED Notes (Signed)
Pt suddenly yells out "fuck" very loudly. Security asks if pt needs anything, he states no. Then mumbles, "stupid faced woman asking me for sex."

## 2021-03-19 NOTE — ED Notes (Signed)
Pt is having conversation with objects that are not present, pt arguing with these hallucinations.

## 2021-03-19 NOTE — ED Provider Notes (Signed)
Emergency Medicine Observation Re-evaluation Note  Nathan Barnett. is a 31 y.o. male, seen on rounds today.  Pt initially presented to the ED for complaints of Psychiatric Evaluation  Currently, the patient was having issues with sleeping and starting to get a little agitated   Physical Exam  Blood pressure (!) 158/88, pulse 90, temperature 98.7 F (37.1 C), temperature source Oral, resp. rate 17, height 6' (1.829 m), weight 85.7 kg, SpO2 95 %.  Physical Exam General: agitated  Pulm: Normal WOB      ED Course / MDM     I have reviewed the labs performed to date as well as medications administered while in observation.  Recent changes in the last 24 hours include none   Plan   Current plan is to continue to wait for psych plan/placement if felt warranted  Patient is under full IVC at this time.  Patient was given his nighttime trazodone.  Home meds had not been validated by pharmacy but will be these restarted   Concha Se, MD 03/19/21 [phone]

## 2021-03-19 NOTE — ED Notes (Signed)
Pt can be heard stating, "fuck you" while laying there despite not having a conversation with anyone. Pt suddenly sits up on side of bed and is mumbling to self looking down at floor.

## 2021-03-19 NOTE — ED Notes (Signed)
Hospital meal provided.  100% consumed, pt tolerated w/o complaints.  Waste discarded appropriately.   

## 2021-03-19 NOTE — Consult Note (Signed)
[facility] Consult   Reason for Consult:Psychiatric Evaluation Referring Physician: Dr. Joni Fears Patient Identification: Nathan Barnett. MRN:  [id number] Principal Diagnosis: <principal problem not specified> Diagnosis:  Active Problems:   Schizophrenia (HCC)   Schizoaffective disorder, bipolar type (HCC)   Schizoaffective disorder (HCC)   Psychosis (HCC)   Lithium toxicity   Total Time spent with patient: 1 hour  Subjective:  Nathan Alexandria. is a 31 y.o. male patient presented to [facility] ED via POV with his mom at his side and voluntary. The patient was placed under involuntary commitment status by the EDP. Per the ED triage nurse's note, Pt in via POV w/ mother, patient lives with mother.  Mother feels that his psych meds may need to be adjusted.  Reports worsening aggressive behavior, loud outbursts, and inappropriate language; mother reports behaviors have gradually worsened over the last 6 months since patient has been taken off of Abilify and put on Ziprasidone.  The patient with some outburst in triage, appears to be talking to himself. Denies any visual/auditory hallucinations at this time. The patient presents with internal stimuli, such as asking him if he understands why he is here. The patient shared, "aren't yall supposed to tell me what to do next." The patient often replies with "I don't give a fuck" to some questions. The patient shared that he is prescribed medications by his primary care physician and agreed that he lives with his mother.    The patient was seen face-to-face by this provider; the chart was reviewed and consulted with Dr. Joni Fears on 03/18/2021 due to the patient's care. It was discussed with both providers that the patient does/does not meet the criteria to be admitted to the inpatient unit.  On evaluation, the patient is alert and oriented x,3 withdrawn, irritable, guarded but cooperative, and mood-congruent with affect. The  patient does appear to be responding to internal and external stimuli. The patient is presenting with some delusional thinking. The patient denies auditory or visual hallucinations. The patient is seen mumbling under his breath. The patient denies any suicidal, homicidal, or self-harm ideations. The patient is presenting with some psychotic behaviors.   HPI: Per Dr. Joni Fears, Nathan Barnett. is a 31 y.o. male with a past history of schizophrenia who was brought to the ED due to increasingly aggressive behavior, impulsive outbursts.  Mother is concerned that this may be due to medication change 6 months ago. Patient denies any complaints, denies SI HI or hallucinations.  However, he does appear to be responding to internal stimuli and somewhat disorganized thought process.   Patient denies any injuries.  Past Psychiatric History:   Risk to Self:   Risk to Others:   Prior Inpatient Therapy:   Prior Outpatient Therapy:    Past Medical History:  Past Medical History:  Diagnosis Date   Mental health problem    History reviewed. No pertinent surgical history. Family History: No family history on file. Family Psychiatric  History:  Social History:  Social History   Substance and Sexual Activity  Alcohol Use Yes     Social History   Substance and Sexual Activity  Drug Use Not Currently    Social History   Socioeconomic History   Marital status: Single    Spouse name: Not on file   Number of children: Not on file   Years of education: Not on file   Highest education level: Not on file  Occupational History   Not on file  Tobacco Use   Smoking status: Every Day    Packs/day: 2.00    Years: 2.00    Pack years: 4.00    Types: Cigarettes   Smokeless tobacco: Never  Vaping Use   Vaping Use: Never used  Substance and Sexual Activity   Alcohol use: Yes   Drug use: Not Currently   Sexual activity: Not Currently  Other Topics Concern   Not on file  Social History  Narrative   Not on file   Social Determinants of Health   Financial Resource Strain: Not on file  Food Insecurity: Not on file  Transportation Needs: Not on file  Physical Activity: Not on file  Stress: Not on file  Social Connections: Not on file   Additional Social History:    Allergies:  No Known Allergies  Labs:  Results for orders placed or performed during the hospital encounter of 03/18/21 (from the past 48 hour(s))  CBC     Status: Abnormal   Collection Time: 03/18/21  7:55 PM  Result Value Ref Range   WBC 7.1 4.0 - 10.5 K/uL   RBC 6.27 (H) 4.22 - 5.81 MIL/uL   Hemoglobin 14.6 13.0 - 17.0 g/dL   HCT 23.3 00.7 - 62.2 %   MCV 74.0 (L) 80.0 - 100.0 fL   MCH 23.3 (L) 26.0 - 34.0 pg   MCHC 31.5 30.0 - 36.0 g/dL   RDW 63.3 35.4 - 56.2 %   Platelets 341 150 - 400 K/uL   nRBC 0.0 0.0 - 0.2 %    Comment: Performed at [facility], [address] [place], [place] [address]  Comprehensive metabolic panel     Status: None   Collection Time: 03/18/21  7:55 PM  Result Value Ref Range   Sodium 137 135 - 145 mmol/L   Potassium 4.2 3.5 - 5.1 mmol/L   Chloride 105 98 - 111 mmol/L   CO2 25 22 - 32 mmol/L   Glucose, Bld 83 70 - 99 mg/dL    Comment: Glucose reference range applies only to samples taken after fasting for at least 8 hours.   BUN 11 6 - 20 mg/dL   Creatinine, Ser 3.73 0.61 - 1.24 mg/dL   Calcium 9.2 8.9 - 42.8 mg/dL   Total Protein 7.7 6.5 - 8.1 g/dL   Albumin 4.5 3.5 - 5.0 g/dL   AST 20 15 - 41 U/L   ALT 22 0 - 44 U/L   Alkaline Phosphatase 51 38 - 126 U/L   Total Bilirubin 0.6 0.3 - 1.2 mg/dL   GFR, Estimated >76 >81 mL/min    Comment: (NOTE) Calculated using the CKD-EPI Creatinine Equation (2021)    Anion gap 7 5 - 15    Comment: Performed at [facility], [address] [place], [place] [address]  Resp Panel by RT-PCR (Flu A&B, Covid) Nasopharyngeal Swab     Status: None   Collection Time: 03/18/21  9:56 PM   Specimen:  Nasopharyngeal Swab; Nasopharyngeal(NP) swabs in vial transport medium  Result Value Ref Range   SARS Coronavirus 2 by RT PCR NEGATIVE NEGATIVE    Comment: (NOTE) SARS-CoV-2 target nucleic acids are NOT DETECTED.  The SARS-CoV-2 RNA is generally detectable in upper respiratory specimens during the acute phase of infection. The lowest concentration of SARS-CoV-2 viral copies this assay can detect is 138 copies/mL. A negative result does not preclude SARS-Cov-2 infection and should not be used as the sole basis for treatment or other patient management  decisions. A negative result may occur with  improper specimen collection/handling, submission of specimen other than nasopharyngeal swab, presence of viral mutation(s) within the areas targeted by this assay, and inadequate number of viral copies(<138 copies/mL). A negative result must be combined with clinical observations, patient history, and epidemiological information. The expected result is Negative.  Fact Sheet for Patients:  EntrepreneurPulse.com.au  Fact Sheet for Healthcare Providers:  IncredibleEmployment.be  This test is no t yet approved or cleared by the [place] FDA and  has been authorized for detection and/or diagnosis of SARS-CoV-2 by FDA under an Emergency Use Authorization (EUA). This EUA will remain  in effect (meaning this test can be used) for the duration of the COVID-19 declaration under Section 564(b)(1) of the Act, 21 U.S.C.section 360bbb-3(b)(1), unless the authorization is terminated  or revoked sooner.       Influenza A by PCR NEGATIVE NEGATIVE   Influenza B by PCR NEGATIVE NEGATIVE    Comment: (NOTE) The Xpert Xpress SARS-CoV-2/FLU/RSV plus assay is intended as an aid in the diagnosis of influenza from Nasopharyngeal swab specimens and should not be used as a sole basis for treatment. Nasal washings and aspirates are unacceptable for Xpert Xpress  SARS-CoV-2/FLU/RSV testing.  Fact Sheet for Patients: EntrepreneurPulse.com.au  Fact Sheet for Healthcare Providers: IncredibleEmployment.be  This test is not yet approved or cleared by the [place] FDA and has been authorized for detection and/or diagnosis of SARS-CoV-2 by FDA under an Emergency Use Authorization (EUA). This EUA will remain in effect (meaning this test can be used) for the duration of the COVID-19 declaration under Section 564(b)(1) of the Act, 21 U.S.C. section 360bbb-3(b)(1), unless the authorization is terminated or revoked.  Performed at [facility], [address] [place], [place] [address]   Ethanol     Status: None   Collection Time: 03/18/21 10:59 PM  Result Value Ref Range   Alcohol, Ethyl (B) <10 <10 mg/dL    Comment: (NOTE) Lowest detectable limit for serum alcohol is 10 mg/dL.  For medical purposes only. Performed at [facility], [address] [place], [place] [id number]   Salicylate level     Status: Abnormal   Collection Time: 03/18/21 10:59 PM  Result Value Ref Range   Salicylate Lvl Q000111Q (L) 7.0 - 30.0 mg/dL    Comment: Performed at [facility], [address] [place], [place] [address]  Acetaminophen level     Status: Abnormal   Collection Time: 03/18/21 10:59 PM  Result Value Ref Range   Acetaminophen (Tylenol), Serum <10 (L) 10 - 30 ug/mL    Comment: (NOTE) Therapeutic concentrations vary significantly. A range of 10-30 ug/mL  may be an effective concentration for many patients. However, some  are best treated at concentrations outside of this range. Acetaminophen concentrations >150 ug/mL at 4 hours after ingestion  and >50 ug/mL at 12 hours after ingestion are often associated with  toxic reactions.  Performed at [facility], [address] [place], [place] [address]   Lithium level     Status: Abnormal   Collection Time: 03/18/21 10:59 PM   Result Value Ref Range   Lithium Lvl 0.13 (L) 0.60 - 1.20 mmol/L    Comment: Performed at [facility], [address] [place],  [address]    No current facility-administered medications for this encounter.   Current Outpatient Medications  Medication Sig Dispense Refill   ARIPiprazole (ABILIFY) 10 MG tablet Take 10 mg by mouth daily. Pt  takes with 2 mg to equal 12 mg for a total dose     ARIPiprazole (ABILIFY) 2 MG tablet Take 2 mg by mouth daily. Pt takes with 10 mg for a total dose of 12 mg.     benztropine (COGENTIN) 1 MG tablet Take 1 mg by mouth 2 (two) times daily.     busPIRone (BUSPAR) 15 MG tablet Take 15 mg by mouth 2 (two) times daily.     folic acid (FOLVITE) 1 MG tablet Take 1 tablet (1 mg total) by mouth daily. 30 tablet 0   hydrOXYzine (ATARAX/VISTARIL) 25 MG tablet Take 1 tablet (25 mg total) by mouth 3 (three) times daily as needed for anxiety. 30 tablet 0   lithium carbonate (ESKALITH) 450 MG CR tablet Take 1 tablet (450 mg total) by mouth at bedtime. 30 tablet 0   lithium carbonate (LITHOBID) 300 MG CR tablet Take 600 mg by mouth at bedtime.     mirtazapine (REMERON) 15 MG tablet Take 15 mg by mouth at bedtime.     thiamine 100 MG tablet Take 1 tablet (100 mg total) by mouth daily. 30 tablet 0   traZODone (DESYREL) 50 MG tablet Take 1 tablet (50 mg total) by mouth at bedtime as needed and may repeat dose one time if needed for sleep. (Patient not taking: No sig reported) 30 tablet 0   ziprasidone (GEODON) 20 MG capsule Take by mouth.      Musculoskeletal: Strength & Muscle Tone: within normal limits Gait & Station: normal Patient leans: N/A  Psychiatric Specialty Exam:  Presentation  General Appearance: Bizarre  Eye Contact:Minimal  Speech:Clear and Coherent  Speech Volume:Other (comment)  Handedness:Right   Mood and Affect  Mood:Anxious; Dysphoric; Irritable  Affect:Flat; Blunt; Congruent   Thought Process  Thought  Processes:Disorganized  Descriptions of Associations:Loose  Orientation:Full (Time, Place and Person)  Thought Content:Delusions; Paranoid Ideation; Scattered  History of Schizophrenia/Schizoaffective disorder:Yes  Duration of Psychotic Symptoms:Greater than six months  Hallucinations:Hallucinations: None  Ideas of Reference:Delusions; Paranoia  Suicidal Thoughts:Suicidal Thoughts: No  Homicidal Thoughts:Homicidal Thoughts: No   Sensorium  Memory:Immediate Fair; Recent Fair; Remote Fair  Judgment:Poor  Insight:Poor   Executive Functions  Concentration:Fair  Attention Span:Poor  [place]   Psychomotor Activity  Psychomotor Activity:Psychomotor Activity: Normal   Assets  Assets:Communication Skills; Desire for Improvement   Sleep  Sleep:Sleep: Poor Number of Hours of Sleep: 6   Physical Exam: Physical Exam Vitals and nursing note reviewed.  Constitutional:      Appearance: Normal appearance. He is normal weight.  HENT:     Head: Normocephalic and atraumatic.     Right Ear: External ear normal.     Left Ear: External ear normal.     Nose: Nose normal.  Cardiovascular:     Rate and Rhythm: Normal rate.     Pulses: Normal pulses.  Pulmonary:     Effort: Pulmonary effort is normal.  Musculoskeletal:        General: Normal range of motion.     Cervical back: Normal range of motion and neck supple.  Neurological:     General: No focal deficit present.     Mental Status: He is alert and oriented to person, place, and time.  Psychiatric:        Attention and Perception: He is inattentive.        Mood and Affect: Mood is anxious and depressed. Affect is inappropriate.  Speech: Speech is rapid and pressured.        Behavior: Behavior is agitated and withdrawn.        Thought Content: Thought content is paranoid and delusional.        Cognition and Memory: Cognition is impaired.        Judgment:  Judgment is impulsive and inappropriate.   ROS Blood pressure (!) 158/88, pulse 90, temperature 98.7 F (37.1 C), temperature source Oral, resp. rate 17, height 6' (1.829 m), weight 85.7 kg, SpO2 95 %. Body mass index is 25.63 kg/m.  Treatment Plan Summary: Plan Patient does meet criteria for psychiatric inpatient admission  Disposition: Recommend psychiatric Inpatient admission when medically cleared. Supportive therapy provided about ongoing stressors.  Caroline Sauger, NP 03/19/2021 12:58 AM

## 2021-03-19 NOTE — ED Notes (Addendum)
With permission of patient, returned phone call to mother and updated her on patient condition. [phone]

## 2021-03-19 NOTE — ED Notes (Signed)
Pt has remained sitting on side of bed since arrival. Pt suddenly starts to very loudly speak to self. When nurse goes over to pt he immediately states, "don't fucking talk to me" when nurse asks if pt needs anything he repeats, "don't talk to me" pt remains sitting same as he has. Will continue to monitor.

## 2021-03-19 NOTE — ED Notes (Signed)
[facility]-plan to admit  °

## 2021-03-19 NOTE — BH Assessment (Signed)
[facility] BMU- Unable to be accepted at this time due to acuity on unit and staffing, will check back later tonight 03/19/21  Referral information for Psychiatric Hospitalization faxed to;   [facility] ([phone]- [phone]),   [facility] ([phone]),  [address] [phone]),    [facility] [phone] -or- [phone]),   [facility] [phone]).

## 2021-03-19 NOTE — ED Notes (Signed)
Pt provided with sandwich tray and water per request. 

## 2021-03-19 NOTE — ED Notes (Signed)
Pt is experiencing some sort of internal stimuli, holding conversation with self at this time.

## 2021-03-20 ENCOUNTER — Inpatient Hospital Stay
Admission: RE | Admit: 2021-03-20 | Discharge: 2021-04-02 | DRG: 885 | Disposition: A | Discharge status: Home / Self Care | Payer: 59 | Source: Intra-hospital | Attending: Psychiatry | Admitting: Psychiatry

## 2021-03-20 ENCOUNTER — Encounter: Payer: Self-pay | Admitting: Psychiatry

## 2021-03-20 ENCOUNTER — Other Ambulatory Visit: Payer: Self-pay

## 2021-03-20 DIAGNOSIS — F32A Depression, unspecified: Secondary | ICD-10-CM | POA: Diagnosis present

## 2021-03-20 DIAGNOSIS — F29 Unspecified psychosis not due to a substance or known physiological condition: Secondary | ICD-10-CM | POA: Diagnosis not present

## 2021-03-20 DIAGNOSIS — F259 Schizoaffective disorder, unspecified: Principal | ICD-10-CM | POA: Diagnosis present

## 2021-03-20 DIAGNOSIS — I10 Essential (primary) hypertension: Secondary | ICD-10-CM | POA: Diagnosis present

## 2021-03-20 DIAGNOSIS — F17210 Nicotine dependence, cigarettes, uncomplicated: Secondary | ICD-10-CM | POA: Diagnosis present

## 2021-03-20 DIAGNOSIS — F200 Paranoid schizophrenia: Secondary | ICD-10-CM | POA: Diagnosis not present

## 2021-03-20 DIAGNOSIS — F250 Schizoaffective disorder, bipolar type: Secondary | ICD-10-CM | POA: Diagnosis not present

## 2021-03-20 DIAGNOSIS — Z79899 Other long term (current) drug therapy: Secondary | ICD-10-CM | POA: Diagnosis not present

## 2021-03-20 DIAGNOSIS — F203 Undifferentiated schizophrenia: Secondary | ICD-10-CM | POA: Diagnosis not present

## 2021-03-20 DIAGNOSIS — F209 Schizophrenia, unspecified: Secondary | ICD-10-CM | POA: Diagnosis present

## 2021-03-20 DIAGNOSIS — E519 Thiamine deficiency, unspecified: Secondary | ICD-10-CM | POA: Diagnosis present

## 2021-03-20 MED ORDER — ARIPIPRAZOLE 2 MG PO TABS: 2.0000 mg | ORAL_TABLET | Freq: Every day | ORAL | Status: DC

## 2021-03-20 MED ORDER — ACETAMINOPHEN 325 MG PO TABS
650.0000 mg | ORAL_TABLET | Freq: Four times a day (QID) | ORAL | Status: DC | PRN
  Administered 2021-03-23 – 2021-04-02 (×16): 650 mg via ORAL
  Filled 2021-03-20 (×16): qty 2

## 2021-03-20 MED ORDER — BENZTROPINE MESYLATE 1 MG PO TABS
1.0000 mg | ORAL_TABLET | Freq: Two times a day (BID) | ORAL | Status: DC
  Administered 2021-03-20 – 2021-04-02 (×25): 1 mg via ORAL
  Filled 2021-03-20 (×25): qty 1

## 2021-03-20 MED ORDER — ZIPRASIDONE HCL 20 MG PO CAPS
20.0000 mg | ORAL_CAPSULE | Freq: Every day | ORAL | Status: DC
Start: 2021-03-20 — End: 2021-03-22
  Administered 2021-03-21 – 2021-03-22 (×2): 20 mg via ORAL
  Filled 2021-03-20 (×4): qty 1

## 2021-03-20 MED ORDER — HYDROXYZINE HCL 25 MG PO TABS: 25.0000 mg | ORAL_TABLET | Freq: Three times a day (TID) | ORAL | Status: DC | PRN

## 2021-03-20 MED ORDER — THIAMINE HCL 100 MG PO TABS
100.0000 mg | ORAL_TABLET | Freq: Every day | ORAL | Status: DC
  Administered 2021-03-20 – 2021-04-02 (×14): 100 mg via ORAL
  Filled 2021-03-20 (×14): qty 1

## 2021-03-20 MED ORDER — BENZTROPINE MESYLATE 1 MG PO TABS: 1.0000 mg | ORAL_TABLET | Freq: Two times a day (BID) | ORAL | Status: DC

## 2021-03-20 MED ORDER — FOLIC ACID 1 MG PO TABS
1.0000 mg | ORAL_TABLET | Freq: Every day | ORAL | Status: DC
  Administered 2021-03-20 – 2021-04-02 (×14): 1 mg via ORAL
  Filled 2021-03-20 (×14): qty 1

## 2021-03-20 MED ORDER — HYDROXYZINE HCL 25 MG PO TABS
25.0000 mg | ORAL_TABLET | Freq: Three times a day (TID) | ORAL | Status: DC | PRN
  Administered 2021-03-21 – 2021-04-02 (×10): 25 mg via ORAL
  Filled 2021-03-20 (×10): qty 1

## 2021-03-20 MED ORDER — FOLIC ACID 1 MG PO TABS: 1.0000 mg | ORAL_TABLET | Freq: Every day | ORAL | Status: DC

## 2021-03-20 MED ORDER — MAGNESIUM HYDROXIDE 400 MG/5ML PO SUSP: 30.0000 mL | Freq: Every day | ORAL | Status: DC | PRN

## 2021-03-20 MED ORDER — LORAZEPAM 1 MG PO TABS
1.0000 mg | ORAL_TABLET | ORAL | Status: AC | PRN
  Administered 2021-03-20: 1 mg via ORAL
  Filled 2021-03-20: qty 1

## 2021-03-20 MED ORDER — ZIPRASIDONE HCL 40 MG PO CAPS
40.0000 mg | ORAL_CAPSULE | Freq: Every day | ORAL | Status: DC
  Administered 2021-03-20 – 2021-03-21 (×2): 40 mg via ORAL
  Filled 2021-03-20: qty 1

## 2021-03-20 MED ORDER — ARIPIPRAZOLE 2 MG PO TABS
2.0000 mg | ORAL_TABLET | Freq: Every day | ORAL | Status: DC
  Administered 2021-03-21 – 2021-03-22 (×2): 2 mg via ORAL
  Filled 2021-03-20 (×2): qty 1

## 2021-03-20 MED ORDER — ALUM & MAG HYDROXIDE-SIMETH 200-200-20 MG/5ML PO SUSP: 30.0000 mL | ORAL | Status: DC | PRN

## 2021-03-20 MED ORDER — ZIPRASIDONE MESYLATE 20 MG IM SOLR
20.0000 mg | INTRAMUSCULAR | Status: AC | PRN
  Administered 2021-03-20: 20 mg via INTRAMUSCULAR
  Filled 2021-03-20: qty 20

## 2021-03-20 MED ORDER — LITHIUM CARBONATE ER 300 MG PO TBCR
600.0000 mg | EXTENDED_RELEASE_TABLET | Freq: Every day | ORAL | Status: DC
  Administered 2021-03-20 – 2021-03-28 (×9): 600 mg via ORAL
  Filled 2021-03-20 (×9): qty 2

## 2021-03-20 MED ORDER — MIRTAZAPINE 15 MG PO TABS
15.0000 mg | ORAL_TABLET | Freq: Every day | ORAL | Status: DC
  Administered 2021-03-20 – 2021-03-21 (×2): 15 mg via ORAL
  Filled 2021-03-20 (×2): qty 1

## 2021-03-20 MED ORDER — ARIPIPRAZOLE 10 MG PO TABS
10.0000 mg | ORAL_TABLET | Freq: Every day | ORAL | Status: DC
  Administered 2021-03-20 – 2021-03-22 (×3): 10 mg via ORAL
  Filled 2021-03-20 (×3): qty 1

## 2021-03-20 MED ORDER — BUSPIRONE HCL 5 MG PO TABS
15.0000 mg | ORAL_TABLET | Freq: Two times a day (BID) | ORAL | Status: DC
  Administered 2021-03-20 – 2021-04-02 (×26): 15 mg via ORAL
  Filled 2021-03-20 (×27): qty 3

## 2021-03-20 MED ORDER — RISPERIDONE 1 MG PO TBDP
2.0000 mg | ORAL_TABLET | Freq: Three times a day (TID) | ORAL | Status: DC | PRN
  Administered 2021-03-20 – 2021-03-31 (×6): 2 mg via ORAL
  Filled 2021-03-20 (×7): qty 2

## 2021-03-20 MED ORDER — ARIPIPRAZOLE 10 MG PO TABS: 10.0000 mg | ORAL_TABLET | Freq: Every day | ORAL | Status: DC

## 2021-03-20 MED ORDER — BUSPIRONE HCL 5 MG PO TABS: 15.0000 mg | ORAL_TABLET | Freq: Two times a day (BID) | ORAL | Status: DC

## 2021-03-20 NOTE — Progress Notes (Signed)
Patient ID: Nathan Shears., male   DOB: [birth year], 31 y.o.   MRN: [id number] Patient presents with increased paranoia and anxiety, reporting that "people just don't like me". He is guarded upon approach, preoccupied,  with poor eye contact. He is disheveled with body odor/poor hygiene. Patient presented to the ED secondary to worsening symptoms of Schizophrenia and his mother reported that his medications do not seem to work. Mother requesting medication adjustment. Patient has significant past history of mental illness (Schizoaffective disorder) and was previously treated here. Patient  reported that "my mother is too religious and she always talks about God, she wants me to be like God". Patient is irritable, guarded and avoiding conversation.  There are no medical concerns. Skin assessment performed by writer, assisted by Charlynne Pander, MHT: skin is intact with no visible impairment. Patient admitted and oriented to the unit. Safety precautions initiated.

## 2021-03-20 NOTE — Plan of Care (Signed)
Newly admitted. Reporting that his mother overreacted  toward him "because she is too religous". Continues to report that there is nothing wrong with him, that "people just don't like me". Patient is guarded and preoccupied. Refusing to perform hygiene. Patient was made comfortable in room, meals and beverages provided and was encouraged to talk to staff as needed. Safety precautions reinforced.

## 2021-03-20 NOTE — Tx Team (Signed)
Initial Treatment Plan 03/20/2021 2:01 PM Nathan Barnett. NOM:[id number]    PATIENT STRESSORS: Marital or family conflict   Other: Medication ineffectiveness     PATIENT STRENGTHS: Manufacturing systems engineer  Physical Health  Supportive family/friends    PATIENT IDENTIFIED PROBLEMS: Disturbed thought process  Anxiety, restlessness  Self-care deficit  Medication ineffectiveness               DISCHARGE CRITERIA:  Improved stabilization in mood, thinking, and/or behavior Motivation to continue treatment in a less acute level of care Verbal commitment to aftercare and medication compliance  PRELIMINARY DISCHARGE PLAN: Outpatient therapy Participate in family therapy Return to previous living arrangement  PATIENT/FAMILY INVOLVEMENT: This treatment plan has been presented to and reviewed with the patient, Nathan Barnett.  The patient has been given the opportunity to ask questions and make suggestions.  Olin Pia, RN 03/20/2021, 2:01 PM

## 2021-03-20 NOTE — Progress Notes (Signed)
Pt has been in the milieu: ate dinner in the dayroom and received medications. No sign of distress but continues to appear preoccupied, talking to self. Refused to shower.

## 2021-03-20 NOTE — ED Notes (Signed)
Pt given breakfast, trash from previous meals and drinks removed.

## 2021-03-20 NOTE — ED Notes (Signed)
[facility] pending placement 

## 2021-03-20 NOTE — Consult Note (Signed)
[place] [facility]iat[facility] Consult   Reason for Consult:Psychiatric Evaluation Referring Physician: Dr. Joni Fears Patient Identification: Nathan Barnett. MRN:  [id number] Principal Diagnosis: Schizoaffective disorder, bipolar type Diagnosis:  Active Problems:   Schizoaffective disorder, bipolar type ([place])   Psychosis ([place])   Total Time spent with patient: 15 minutes  "I need to get my medications right to get my mind right."  Client continues to be psychotic, standing in place at times for long periods yesterday afternoon.  He is responding to internal stimuli, calm and cooperative today.  Denies side effects from his medications, noncompliant prior to admission.  Continues to meet criteria for inpatient hospitalization.  HPI per Caroline Sauger, PMHNP: [name] is a 31 y.o. male patient presented to [facility] ED via POV with his mom at his side and voluntary. The patient was placed under involuntary commitment status by the EDP. Per the ED triage nurse's note, Pt in via POV w/ mother, patient lives with mother.  Mother feels that his psych meds may need to be adjusted.  Reports worsening aggressive behavior, loud outbursts, and inappropriate language; mother reports behaviors have gradually worsened over the last 6 months since patient has been taken off of Abilify and put on Ziprasidone.  The patient with some outburst in triage, appears to be talking to himself. Denies any visual/auditory hallucinations at this time. The patient presents with internal stimuli, such as asking him if he understands why he is here. The patient shared, "aren't yall supposed to tell me what to do next." The patient often replies with "I don't give a fuck" to some questions. The patient shared that he is prescribed medications by his primary care physician and agreed that he lives with his mother.    The patient was seen face-to-face by this provider; the chart was reviewed and consulted with  Dr. Joni Fears on 03/18/2021 due to the patient's care. It was discussed with both providers that the patient does/does not meet the criteria to be admitted to the inpatient unit.  On evaluation, the patient is alert and oriented x,3 withdrawn, irritable, guarded but cooperative, and mood-congruent with affect. The patient does appear to be responding to internal and external stimuli. The patient is presenting with some delusional thinking. The patient denies auditory or visual hallucinations. The patient is seen mumbling under his breath. The patient denies any suicidal, homicidal, or self-harm ideations. The patient is presenting with some psychotic behaviors.   HPI: Per Dr. Joni Fears, Nathan Barnett. is a 31 y.o. male with a past history of schizophrenia who was brought to the ED due to increasingly aggressive behavior, impulsive outbursts.  Mother is concerned that this may be due to medication change 6 months ago. Patient denies any complaints, denies SI HI or hallucinations.  However, he does appear to be responding to internal stimuli and somewhat disorganized thought process.   Patient denies any injuries.  Past Psychiatric History:   Risk to Self:   Risk to Others:   Prior Inpatient Therapy:   Prior Outpatient Therapy:    Past Medical History:  Past Medical History:  Diagnosis Date   Mental health problem    History reviewed. No pertinent surgical history. Family History: No family history on file. Family Psychiatric  History:  Social History:  Social History   Substance and Sexual Activity  Alcohol Use Yes     Social History   Substance and Sexual Activity  Drug Use Not Currently    Social History  Socioeconomic History   Marital status: Single    Spouse name: Not on file   Number of children: Not on file   Years of education: Not on file   Highest education level: Not on file  Occupational History   Not on file  Tobacco Use   Smoking status: Every Day     Packs/day: 2.00    Years: 2.00    Pack years: 4.00    Types: Cigarettes   Smokeless tobacco: Never  Vaping Use   Vaping Use: Never used  Substance and Sexual Activity   Alcohol use: Yes   Drug use: Not Currently   Sexual activity: Not Currently  Other Topics Concern   Not on file  Social History Narrative   Not on file   Social Determinants of Health   Financial Resource Strain: Not on file  Food Insecurity: Not on file  Transportation Needs: Not on file  Physical Activity: Not on file  Stress: Not on file  Social Connections: Not on file   Additional Social History:    Allergies:  No Known Allergies  Labs:  Results for orders placed or performed during the hospital encounter of 03/18/21 (from the past 48 hour(s))  CBC     Status: Abnormal   Collection Time: 03/18/21  7:55 PM  Result Value Ref Range   WBC 7.1 4.0 - 10.5 K/uL   RBC 6.27 (H) 4.22 - 5.81 MIL/uL   Hemoglobin 14.6 13.0 - 17.0 g/dL   HCT 40.8 14.4 - 81.8 %   MCV 74.0 (L) 80.0 - 100.0 fL   MCH 23.3 (L) 26.0 - 34.0 pg   MCHC 31.5 30.0 - 36.0 g/dL   RDW 56.3 14.9 - 70.2 %   Platelets 341 150 - 400 K/uL   nRBC 0.0 0.0 - 0.2 %    Comment: Performed at [facility], [address] [place], [place] [address]  Comprehensive metabolic panel     Status: None   Collection Time: 03/18/21  7:55 PM  Result Value Ref Range   Sodium 137 135 - 145 mmol/L   Potassium 4.2 3.5 - 5.1 mmol/L   Chloride 105 98 - 111 mmol/L   CO2 25 22 - 32 mmol/L   Glucose, Bld 83 70 - 99 mg/dL    Comment: Glucose reference range applies only to samples taken after fasting for at least 8 hours.   BUN 11 6 - 20 mg/dL   Creatinine, Ser 8.85 0.61 - 1.24 mg/dL   Calcium 9.2 8.9 - 02.7 mg/dL   Total Protein 7.7 6.5 - 8.1 g/dL   Albumin 4.5 3.5 - 5.0 g/dL   AST 20 15 - 41 U/L   ALT 22 0 - 44 U/L   Alkaline Phosphatase 51 38 - 126 U/L   Total Bilirubin 0.6 0.3 - 1.2 mg/dL   GFR, Estimated >74 >12 mL/min    Comment:  (NOTE) Calculated using the CKD-EPI Creatinine Equation (2021)    Anion gap 7 5 - 15    Comment: Performed at [facility], [address] [place], [place] [address]  Resp Panel by RT-PCR (Flu A&B, Covid) Nasopharyngeal Swab     Status: None   Collection Time: 03/18/21  9:56 PM   Specimen: Nasopharyngeal Swab; Nasopharyngeal(NP) swabs in vial transport medium  Result Value Ref Range   SARS Coronavirus 2 by RT PCR NEGATIVE NEGATIVE    Comment: (NOTE) SARS-CoV-2 target nucleic acids are NOT DETECTED.  The SARS-CoV-2 RNA is generally  detectable in upper respiratory specimens during the acute phase of infection. The lowest concentration of SARS-CoV-2 viral copies this assay can detect is 138 copies/mL. A negative result does not preclude SARS-Cov-2 infection and should not be used as the sole basis for treatment or other patient management decisions. A negative result may occur with  improper specimen collection/handling, submission of specimen other than nasopharyngeal swab, presence of viral mutation(s) within the areas targeted by this assay, and inadequate number of viral copies(<138 copies/mL). A negative result must be combined with clinical observations, patient history, and epidemiological information. The expected result is Negative.  Fact Sheet for Patients:  EntrepreneurPulse.com.au  Fact Sheet for Healthcare Providers:  IncredibleEmployment.be  This test is no t yet approved or cleared by the [place] FDA and  has been authorized for detection and/or diagnosis of SARS-CoV-2 by FDA under an Emergency Use Authorization (EUA). This EUA will remain  in effect (meaning this test can be used) for the duration of the COVID-19 declaration under Section 564(b)(1) of the Act, 21 U.S.C.section 360bbb-3(b)(1), unless the authorization is terminated  or revoked sooner.       Influenza A by PCR NEGATIVE NEGATIVE   Influenza B by  PCR NEGATIVE NEGATIVE    Comment: (NOTE) The Xpert Xpress SARS-CoV-2/FLU/RSV plus assay is intended as an aid in the diagnosis of influenza from Nasopharyngeal swab specimens and should not be used as a sole basis for treatment. Nasal washings and aspirates are unacceptable for Xpert Xpress SARS-CoV-2/FLU/RSV testing.  Fact Sheet for Patients: EntrepreneurPulse.com.au  Fact Sheet for Healthcare Providers: IncredibleEmployment.be  This test is not yet approved or cleared by the [place] FDA and has been authorized for detection and/or diagnosis of SARS-CoV-2 by FDA under an Emergency Use Authorization (EUA). This EUA will remain in effect (meaning this test can be used) for the duration of the COVID-19 declaration under Section 564(b)(1) of the Act, 21 U.S.C. section 360bbb-3(b)(1), unless the authorization is terminated or revoked.  Performed at [facility], [address] [place], [place] [address]   Ethanol     Status: None   Collection Time: 03/18/21 10:59 PM  Result Value Ref Range   Alcohol, Ethyl (B) <10 <10 mg/dL    Comment: (NOTE) Lowest detectable limit for serum alcohol is 10 mg/dL.  For medical purposes only. Performed at [facility], [address] [place], [place] [id number]   Salicylate level     Status: Abnormal   Collection Time: 03/18/21 10:59 PM  Result Value Ref Range   Salicylate Lvl Q000111Q (L) 7.0 - 30.0 mg/dL    Comment: Performed at [facility], [address] [place], [place] [address]  Acetaminophen level     Status: Abnormal   Collection Time: 03/18/21 10:59 PM  Result Value Ref Range   Acetaminophen (Tylenol), Serum <10 (L) 10 - 30 ug/mL    Comment: (NOTE) Therapeutic concentrations vary significantly. A range of 10-30 ug/mL  may be an effective concentration for many patients. However, some  are best treated at concentrations outside of this range. Acetaminophen  concentrations >150 ug/mL at 4 hours after ingestion  and >50 ug/mL at 12 hours after ingestion are often associated with  toxic reactions.  Performed at [facility], [address] [place], [place] [address]   Lithium level     Status: Abnormal   Collection Time: 03/18/21 10:59 PM  Result Value Ref Range   Lithium Lvl 0.13 (L) 0.60 - 1.20 mmol/L  Comment: Performed at [facility], [address] [place], [place] [address]    Current Facility-Administered Medications  Medication Dose Route Frequency Provider Last Rate Last Admin   ARIPiprazole (ABILIFY) tablet 10 mg  10 mg Oral Daily Patrecia Pour, NP   10 mg at 03/20/21 1053   ARIPiprazole (ABILIFY) tablet 2 mg  2 mg Oral Daily Patrecia Pour, NP   2 mg at 03/20/21 1055   benztropine (COGENTIN) tablet 1 mg  1 mg Oral BID Patrecia Pour, NP   1 mg at 03/20/21 1054   busPIRone (BUSPAR) tablet 15 mg  15 mg Oral BID Patrecia Pour, NP   15 mg at [id number]   folic acid (FOLVITE) tablet 1 mg  1 mg Oral Daily Patrecia Pour, NP   1 mg at 03/20/21 1054   hydrOXYzine (ATARAX) tablet 25 mg  25 mg Oral TID PRN Patrecia Pour, NP   25 mg at 03/19/21 1823   lithium carbonate (LITHOBID) CR tablet 600 mg  600 mg Oral QHS Patrecia Pour, NP   600 mg at 03/19/21 2216   mirtazapine (REMERON) tablet 15 mg  15 mg Oral QHS Patrecia Pour, NP   15 mg at 03/19/21 2215   thiamine tablet 100 mg  100 mg Oral Daily Patrecia Pour, NP   100 mg at 03/20/21 1054   ziprasidone (GEODON) capsule 20 mg  20 mg Oral Daily Patrecia Pour, NP   20 mg at 03/20/21 1054   ziprasidone (GEODON) capsule 40 mg  40 mg Oral QHS Patrecia Pour, NP   40 mg at 03/19/21 2216   Current Outpatient Medications  Medication Sig Dispense Refill   benztropine (COGENTIN) 1 MG tablet Take 1 mg by mouth 2 (two) times daily.     busPIRone (BUSPAR) 15 MG tablet Take 15 mg by mouth 2 (two) times daily.     hydrOXYzine (VISTARIL) 25 MG capsule Take 25 mg  by mouth 3 (three) times daily as needed.     lithium carbonate (LITHOBID) 300 MG CR tablet Take 600 mg by mouth at bedtime.     ziprasidone (GEODON) 20 MG capsule Take 20 mg by mouth 2 (two) times daily with a meal. Take 20mg  by mouth in the morning, and 40mg  by mouth in the evening. Take with food     ARIPiprazole (ABILIFY) 10 MG tablet Take 10 mg by mouth daily. Pt takes with 2 mg to equal 12 mg for a total dose (Patient not taking: Reported on 03/19/2021)     ARIPiprazole (ABILIFY) 2 MG tablet Take 2 mg by mouth daily. Pt takes with 10 mg for a total dose of 12 mg. (Patient not taking: Reported on [id number])     folic acid (FOLVITE) 1 MG tablet Take 1 tablet (1 mg total) by mouth daily. (Patient not taking: Reported on 03/19/2021) 30 tablet 0   hydrOXYzine (ATARAX/VISTARIL) 25 MG tablet Take 1 tablet (25 mg total) by mouth 3 (three) times daily as needed for anxiety. 30 tablet 0   lithium carbonate (ESKALITH) 450 MG CR tablet Take 1 tablet (450 mg total) by mouth at bedtime. (Patient not taking: Reported on 03/19/2021) 30 tablet 0   mirtazapine (REMERON) 15 MG tablet Take 15 mg by mouth at bedtime.     thiamine 100 MG tablet Take 1 tablet (100 mg total) by mouth daily. (Patient not taking: Reported on 03/19/2021) 30 tablet 0   traZODone (DESYREL) 50  MG tablet Take 1 tablet (50 mg total) by mouth at bedtime as needed and may repeat dose one time if needed for sleep. (Patient not taking: Reported on 10/03/2020) 30 tablet 0    Musculoskeletal: Strength & Muscle Tone: within normal limits Gait & Station: normal Patient leans: N/A  Psychiatric Specialty Exam: Physical Exam Vitals and nursing note reviewed.  Constitutional:      Appearance: Normal appearance.  HENT:     Head: Normocephalic and atraumatic.     Nose: Nose normal.  Pulmonary:     Effort: Pulmonary effort is normal.  Musculoskeletal:        General: Normal range of motion.     Cervical back: Normal range of motion.  Neurological:      General: No focal deficit present.     Mental Status: He is alert and oriented to person, place, and time.  Psychiatric:        Attention and Perception: He is inattentive.        Mood and Affect: Mood is anxious and depressed. Affect is flat.        Speech: Speech normal.        Behavior: Behavior is withdrawn. Behavior is cooperative.        Thought Content: Thought content is paranoid and delusional.        Cognition and Memory: Cognition is impaired.        Judgment: Judgment is inappropriate.    Review of Systems  Psychiatric/Behavioral:  Positive for hallucinations. The patient is nervous/anxious.   All other systems reviewed and are negative.  Blood pressure (!) 135/95, pulse 96, temperature 97.9 F (36.6 C), temperature source Oral, resp. rate 18, height 6' (1.829 m), weight 85.7 kg, SpO2 99 %.Body mass index is 25.63 kg/m.  General Appearance: Casual  Eye Contact:  Fair  Speech:  Normal Rate  Volume:  Normal  Mood:  Anxious  Affect:  Flat  Thought Process:  Coherent and Descriptions of Associations: Intact  Orientation:  Full (Time, Place, and Person)  Thought Content:  Hallucinations: Auditory Visual  Suicidal Thoughts:  No  Homicidal Thoughts:  No  Memory:  Immediate;   Poor Recent;   Poor Remote;   Poor  Judgement:  Impaired  Insight:  Fair  Psychomotor Activity:  Decreased  Concentration:  Concentration: Fair and Attention Span: Fair  Recall:  [employer] of Knowledge:  Fair  Language:  Fair  Akathisia:  No  Handed:  Right  AIMS (if indicated):     Assets:  Housing Leisure Time Physical Health Resilience Social Support  ADL's:  Intact  Cognition:  Impaired,  Mild  Sleep:         Physical Exam: Physical Exam Vitals and nursing note reviewed.  Constitutional:      Appearance: Normal appearance.  HENT:     Head: Normocephalic and atraumatic.     Nose: Nose normal.  Pulmonary:     Effort: Pulmonary effort is normal.  Musculoskeletal:         General: Normal range of motion.     Cervical back: Normal range of motion.  Neurological:     General: No focal deficit present.     Mental Status: He is alert and oriented to person, place, and time.  Psychiatric:        Attention and Perception: He is inattentive.        Mood and Affect: Mood is anxious and depressed. Affect is flat.  Speech: Speech normal.        Behavior: Behavior is withdrawn. Behavior is cooperative.        Thought Content: Thought content is paranoid and delusional.        Cognition and Memory: Cognition is impaired.        Judgment: Judgment is inappropriate.   Review of Systems  Psychiatric/Behavioral:  Positive for hallucinations. The patient is nervous/anxious.   All other systems reviewed and are negative. Blood pressure (!) 135/95, pulse 96, temperature 97.9 F (36.6 C), temperature source Oral, resp. rate 18, height 6' (1.829 m), weight 85.7 kg, SpO2 99 %. Body mass index is 25.63 kg/m.  Treatment Plan Summary: Schizoaffective disorder, bipolar type: -Continue Lithium 600 mg daily -Continue Abilify 12 mg daily with considerations for  Abilify [place] Continue Geodon 20 mg in the am and 40 mg in the pm  Anxiety: Continue buspirone 15 mg BID Continue hydroxyzine 25 mg TID PRN  EPS: Continue Cogentin 1 mg daily  Insomnia: Continue Remeron 15 mg daily at bedtime  Disposition: Recommend psychiatric Inpatient admission when medically cleared. Supportive therapy provided about ongoing stressors.  Waylan Boga, NP 03/20/2021 12:45 PM

## 2021-03-20 NOTE — BH Assessment (Addendum)
Patient is to be admitted to [facility] by Psychiatric Nurse Practitioner Nanine Means.  Attending Physician will be Dr.  Marlou Porch .   Patient has been assigned to room 310, by [facility] Charge Nurse Wendall Mola, RN.   Intake Paper Work has been signed and placed on patient chart.   ER staff is aware of the admission: Ronnie, ER Secretary   Dr. Katrinka Blazing, ER MD  Tresa Endo, Patient's Nurse  Alli, Patient Access.

## 2021-03-20 NOTE — ED Provider Notes (Signed)
Emergency Medicine Observation Re-evaluation Note  Nathan Barnett. is a 31 y.o. male, seen on rounds today.  Pt initially presented to the ED for complaints of Psychiatric Evaluation Currently, the patient is resting.  Physical Exam  BP (!) 136/97    Pulse 89    Temp 98.9 F (37.2 C) (Oral)    Resp 14    Ht 1.829 m (6')    Wt 85.7 kg    SpO2 96%    BMI 25.63 kg/m  Physical Exam Gen:  No acute distress Resp:  Breathing easily and comfortably, no accessory muscle usage Neuro:  Moving all four extremities, no gross focal neuro deficits Psych:  Resting currently, calm when awake  ED Course / MDM  EKG:   I have reviewed the labs performed to date as well as medications administered while in observation.  Recent changes in the last 24 hours include no significant changes.  Plan  Current plan is for psych placement. Nathan Barnett. is under involuntary commitment.      Loleta Rose, MD 03/20/21 [phone]

## 2021-03-20 NOTE — BH Assessment (Addendum)
[facility] BMU- Unable to be accepted at this time   Referral information for Psychiatric Hospitalization re-faxed to;    [facility] [phone]), Denied due to no insurance   [facility] [phone]),   [facility] [phone]),  Currently on the wait list due to no insurance   [facility] [phone] -or- [phone]), Denied due to no insurance   [facility] [phone]).

## 2021-03-21 DIAGNOSIS — F200 Paranoid schizophrenia: Secondary | ICD-10-CM

## 2021-03-21 LAB — LIPID PANEL
Cholesterol: 174 mg/dL (ref 0–200)
HDL: 39 mg/dL — ABNORMAL LOW (ref >40)
LDL Cholesterol: 116 mg/dL — ABNORMAL HIGH (ref 0–99)
Total CHOL/HDL Ratio: 4.5 RATIO
Triglycerides: 95 mg/dL (ref <150)
VLDL: 19 mg/dL (ref 0–40)

## 2021-03-21 MED ORDER — HALOPERIDOL LACTATE 5 MG/ML IJ SOLN: 10.0000 mg | Freq: Four times a day (QID) | INTRAMUSCULAR | Status: DC | PRN

## 2021-03-21 MED ORDER — DIPHENHYDRAMINE HCL 50 MG/ML IJ SOLN: 100.0000 mg | Freq: Four times a day (QID) | INTRAMUSCULAR | Status: DC | PRN

## 2021-03-21 MED ORDER — DIPHENHYDRAMINE HCL 25 MG PO CAPS
100.0000 mg | ORAL_CAPSULE | Freq: Four times a day (QID) | ORAL | Status: DC | PRN
  Administered 2021-03-21 – 2021-04-01 (×17): 100 mg via ORAL
  Filled 2021-03-21 (×17): qty 4

## 2021-03-21 MED ORDER — HALOPERIDOL 5 MG PO TABS
10.0000 mg | ORAL_TABLET | Freq: Four times a day (QID) | ORAL | Status: DC | PRN
  Administered 2021-03-21 – 2021-03-29 (×12): 10 mg via ORAL
  Filled 2021-03-21 (×12): qty 2

## 2021-03-21 MED ORDER — LORAZEPAM 2 MG/ML IJ SOLN: 2.0000 mg | Freq: Four times a day (QID) | INTRAMUSCULAR | Status: DC | PRN

## 2021-03-21 MED ORDER — LORAZEPAM 2 MG PO TABS
2.0000 mg | ORAL_TABLET | Freq: Four times a day (QID) | ORAL | Status: DC | PRN
  Administered 2021-03-21 – 2021-03-29 (×11): 2 mg via ORAL
  Filled 2021-03-21 (×11): qty 1

## 2021-03-21 NOTE — Progress Notes (Signed)
[facility] LCSW Progress Note   03/21/2021    12:01 PM  Hyland Mollen[place]opf [place].   [phone]   Type of Contact and Topic:  Release of Information  Kendyl Bissonnette [name], patient, has DECLINED to provide written consent for CSW team to coordinate aftercare at this time. CSW to have further conversations with patient regarding care in the community.   Patient is currently declining outpatient mental health services despite stating goal of "get my medicaitons readressed"  Signed:  Corky Crafts, MSW, [place], LCASA 03/21/2021 12:01 PM

## 2021-03-21 NOTE — [facility] Counselor (Signed)
Adult Comprehensive Assessment  Patient ID: Nathan Oki., male   DOB: Jun 11, 1990, 31 y.o.   MRN: [id number]  Information Source: Information source: Patient  Current Stressors:  Patient states their primary concerns and needs for treatment are:: Patient states the reason for hospitalization is "my mom does this from time to time." Patient states their goals for this hospitilization and ongoing recovery are:: Patient would like for "medications to be readdressed." Educational / Learning stressors: none reported Employment / Job issues: none reported Family Relationships: Patient describes conflict with mother, states he is not incompitent yet describes some dependency for unknown reasons. Financial / Lack of resources (include bankruptcy): none reported, states he is supported by his mother. Housing / Lack of housing: none reported, states he is provided housing by his mother. Physical health (include injuries & life threatening diseases): none reported Social relationships: none reported, patient states that he isolates to himself. Substance abuse: none reported, UDS not on file. Bereavement / Loss: none reported  Living/Environment/Situation:  Living Arrangements: Parent Living conditions (as described by patient or guardian): no concerns related to housing conditions expressed. Who else lives in the home?: Patient moved back in with his mother and brother 7 months ago. What is atmosphere in current home: Chaotic, Supportive  Family History:  Marital status: Single Are you sexually active?: No What is your sexual orientation?: Heterosexual Does patient have children?: No  Childhood History:  By whom was/is the patient raised?: Mother, Other (Comment) (Patient child hood hx conflicts with prior reports, patient stated prior that he lived with his mother and later lived with his father. He currently states that he has had no relationship with his father as a  child.) Description of patient's relationship with caregiver when they were a child: Per prior report 1+ year ago, paitent stated that he was "close to his mother, but father was verbally abusive." Currently states that his relationship with his mother was "fine" Patient's description of current relationship with people who raised him/her: Patient states his current relationship is "fine" with his mother. How were you disciplined when you got in trouble as a child/adolescent?: "I didnt need any of that, " Does patient have siblings?: Yes Number of Siblings: 3 Description of patient's current relationship with siblings: Patietn states "we keep our space" Did patient suffer any verbal/emotional/physical/sexual abuse as a child?: No Did patient suffer from severe childhood neglect?: No Has patient ever been sexually abused/assaulted/raped as an adolescent or adult?: No Was the patient ever a victim of a crime or a disaster?: No Witnessed domestic violence?: No Has patient been affected by domestic violence as an adult?: No  Education:  Highest grade of school patient has completed: HS Diploma; BA in [employer] Currently a student?: No Learning disability?: No  Employment/Work Situation:   Employment Situation: Unemployed Patient's Job has Been Impacted by Current Illness:  (unknown) What is the Longest Time Patient has Held a Job?: 1 years 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 If attempted suicide, did drugs/alcohol play a role in this?:  (n/a) Alcohol/Substance Abuse Treatment Hx: Denies past history Has alcohol/substance abuse ever caused legal problems?:  No  Social Support System:  Patient's Community Support System:  ("fine") Describe Community Support System: When asked who is supportive of his mental health, patient states "nobody" Type of faith/religion: Patient states, "I am athiest . . . or agnostic" How does patient's faith help to cope with current illness?: Patient became increasingly fixated and disorganized when asked about religion.  Leisure/Recreation:      Strengths/Needs:   Patient states these barriers may affect/interfere with their treatment: none reported Patient states these barriers may affect their return to the community: none reported Other important information patient would like considered in planning for their treatment: none reported  Discharge Plan:   Currently receiving community mental health services: No Patient states concerns and preferences for aftercare planning are: Patient is currently declining outpatient mental health services despite stating goal of "get my medicaitons readressed" Does patient have access to transportation?: Yes Does patient have financial barriers related to discharge medications?: Yes (no insurance noted.) Will patient be returning to same living situation after discharge?: Yes  Summary/Recommendations:   Summary and Recommendations (to be completed by the evaluator): 31 y/o male w/ dx of Schizoaffective d/o, Bipolar type from [place] w/ no ins noted; admitted due to increasing agressive and disorganized behavior for the past 6 months. Patient has little incite into mental health at time of assessment, states the reason for hospital admission is "my mother does this from time to time." Paitent is restless and agitated during assessment, continues to pace room during questioning. Patient appears irritated by something unknown. Momentarily after assessment, paitent is observed hitting walls and slamming doors; consistent with mother's report in the ED. When asked, patient states  agressive behaviors are due to "holding on to a grudge," no further details provided. Forwards little details in general. When patient does provide details, it is quite difficult for writer to grasp relevance or context. Appearance is WNL, unable to asess memory due to presentation, concentration appears fixated on unknown stressor. Patient remains bizare and disorganized, speech context is impaired by psychotic features. Theraputic recomendations include crisis stabilizaiton, med mgnt, group therapy, and case management.  Corky Crafts. 03/21/2021

## 2021-03-21 NOTE — H&P (Signed)
Psychiatric Admission Assessment Adult  Patient Identification: Nathan Barnett. MRN:  [id number] Date of Evaluation:  03/21/2021 Chief Complaint:  Schizophrenia ([place]) [F20.9] Principal Diagnosis: <principal problem not specified> Diagnosis:  Active Problems:   Schizophrenia ([place])  History of Present Illness:  Nathan Barnett is a 31 year old African-American male who presented to the emergency room with his mom on an involuntary commitment for worsening behavior.  Mom states that his medications need adjusted because he is becoming very angry, irritable, aggressive and states that he is internally preoccupied.  Nathan Barnett is refusing to talk to me stating it is always the same thing.  PER INITIAL INTAKE: Nathan Barnett. is a 31 y.o. male patient presented to [facility] ED via POV with his mom at his side and voluntary. The patient was placed under involuntary commitment status by the EDP. Per the ED triage nurse's note, Pt in via POV w/ mother, patient lives with mother.  Mother feels that his psych meds may need to be adjusted.  Reports worsening aggressive behavior, loud outbursts, and inappropriate language; mother reports behaviors have gradually worsened over the last 6 months since patient has been taken off of Abilify and put on Ziprasidone.  The patient with some outburst in triage, appears to be talking to himself. Denies any visual/auditory hallucinations at this time. The patient presents with internal stimuli, such as asking him if he understands why he is here. The patient shared, "aren't yall supposed to tell me what to do next." The patient often replies with "I don't give a fuck" to some questions. The patient shared that he is prescribed medications by his primary care physician and agreed that he lives with his mother.    The patient was seen face-to-face by this provider; the chart was reviewed and consulted with Dr. Joni Fears on 03/18/2021 due to the patient's care. It was discussed  with both providers that the patient does/does not meet the criteria to be admitted to the inpatient unit.  On evaluation, the patient is alert and oriented x,3 withdrawn, irritable, guarded but cooperative, and mood-congruent with affect. The patient does appear to be responding to internal and external stimuli. The patient is presenting with some delusional thinking. The patient denies auditory or visual hallucinations. The patient is seen mumbling under his breath. The patient denies any suicidal, homicidal, or self-harm ideations. The patient is presenting with some psychotic behaviors.    Associated Signs/Symptoms: Depression Symptoms:  psychomotor agitation, Duration of Depression Symptoms: Greater than two weeks  (Hypo) Manic Symptoms:  Hallucinations, Anxiety Symptoms:   Unknown Psychotic Symptoms:  Hallucinations: Auditory PTSD Symptoms: NA Total Time spent with patient: 1 hour  Past Psychiatric History: Yes  Is the patient at risk to self? No.  Has the patient been a risk to self in the past 6 months? No.  Has the patient been a risk to self within the distant past? No.  Is the patient a risk to others? Yes.    Has the patient been a risk to others in the past 6 months? Yes.    Has the patient been a risk to others within the distant past? Yes.     Prior Inpatient Therapy:   Prior Outpatient Therapy:    Alcohol Screening: 1. How often do you have a drink containing alcohol?: Monthly or less 2. How many drinks containing alcohol do you have on a typical day when you are drinking?: 1 or 2 3. How often do you have six or more drinks on  one occasion?: Never AUDIT-C Score: 1 4. How often during the last year have you found that you were not able to stop drinking once you had started?: Never 5. How often during the last year have you failed to do what was normally expected from you because of drinking?: Never 6. How often during the last year have you needed a first drink in the  morning to get yourself going after a heavy drinking session?: Never 7. How often during the last year have you had a feeling of guilt of remorse after drinking?: Never 8. How often during the last year have you been unable to remember what happened the night before because you had been drinking?: Never 9. Have you or someone else been injured as a result of your drinking?: No 10. Has a relative or friend or a doctor or another health worker been concerned about your drinking or suggested you cut down?: No Alcohol Use Disorder Identification Test Final Score (AUDIT): 1 Substance Abuse History in the last 12 months:  Unknown   Previous Psychotropic Medications: Yes  Psychological Evaluations: No  Past Medical History:  Past Medical History:  Diagnosis Date   Mental health problem    History reviewed. No pertinent surgical history. Family History: History reviewed. No pertinent family history. Family Psychiatric  History: Unknown Tobacco Screening:   Social History:  Social History   Substance and Sexual Activity  Alcohol Use Yes     Social History   Substance and Sexual Activity  Drug Use Not Currently    Additional Social History: Marital status: Single Are you sexually active?: No What is your sexual orientation?: Heterosexual Does patient have children?: No                         Allergies:  No Known Allergies Lab Results:  Results for orders placed or performed during the hospital encounter of 03/20/21 (from the past 48 hour(s))  Lipid panel     Status: Abnormal   Collection Time: 03/21/21  9:10 AM  Result Value Ref Range   Cholesterol 174 0 - 200 mg/dL   Triglycerides 95 <150 mg/dL   HDL 39 (L) >40 mg/dL   Total CHOL/HDL Ratio 4.5 RATIO   VLDL 19 0 - 40 mg/dL   LDL Cholesterol 116 (H) 0 - 99 mg/dL    Comment:        Total Cholesterol/HDL:CHD Risk Coronary Heart Disease Risk Table                     Men   Women  1/2 Average Risk   3.4   3.3   Average Risk       5.0   4.4  2 X Average Risk   9.6   7.1  3 X Average Risk  23.4   11.0        Use the calculated Patient Ratio above and the CHD Risk Table to determine the patient's CHD Risk.        ATP III CLASSIFICATION (LDL):  <100     mg/dL   Optimal  100-129  mg/dL   Near or Above                    Optimal  130-159  mg/dL   Borderline  160-189  mg/dL   High  >190     mg/dL   Very High Performed at [facility], [address] [place], [place] [address]     Blood Alcohol level:  Lab Results  Component Value Date   ETH <10 03/18/2021   ETH <10 0000000    Metabolic Disorder Labs:  Lab Results  Component Value Date   HGBA1C 4.8 12/03/2019   MPG 91.06 12/03/2019   MPG 85.32 06/27/2017   Lab Results  Component Value Date   PROLACTIN 25.4 (H) 12/04/2019   Lab Results  Component Value Date   CHOL 174 03/21/2021   TRIG 95 03/21/2021   HDL 39 (L) 03/21/2021   CHOLHDL 4.5 03/21/2021   VLDL 19 03/21/2021   LDLCALC 116 (H) 03/21/2021   LDLCALC 62 12/03/2019    Current Medications: Current Facility-Administered Medications  Medication Dose Route Frequency Provider Last Rate Last Admin   acetaminophen (TYLENOL) tablet 650 mg  650 mg Oral Q6H PRN Clapacs, John T, MD       alum & mag hydroxide-simeth (MAALOX/MYLANTA) 200-200-20 MG/5ML suspension 30 mL  30 mL Oral Q4H PRN Clapacs, John T, MD       ARIPiprazole (ABILIFY) tablet 10 mg  10 mg Oral Daily Clapacs, John T, MD   10 mg at 03/21/21 0810   ARIPiprazole (ABILIFY) tablet 2 mg  2 mg Oral Daily Clapacs, [name], MD   2 mg at 03/21/21 0810   benztropine (COGENTIN) tablet 1 mg  1 mg Oral BID Clapacs, John T, MD   1 mg at 03/21/21 0811   busPIRone (BUSPAR) tablet 15 mg  15 mg Oral BID Patrecia Pour, NP   15 mg at 03/21/21 0810   diphenhydrAMINE (BENADRYL) capsule 100 mg  100 mg Oral Q6H PRN [name], DO   100 mg at 03/21/21 1107   Or   diphenhydrAMINE (BENADRYL) injection 100 mg  100 mg  Intramuscular Q6H PRN [name], DO       folic acid (FOLVITE) tablet 1 mg  1 mg Oral Daily Clapacs, John T, MD   1 mg at 03/21/21 [id number]   haloperidol (HALDOL) tablet 10 mg  10 mg Oral Q6H PRN [name], DO   10 mg at 03/21/21 1107   Or   haloperidol lactate (HALDOL) injection 10 mg  10 mg Intramuscular Q6H PRN [name], DO       hydrOXYzine (ATARAX) tablet 25 mg  25 mg Oral TID PRN Clapacs, [name], MD       lithium carbonate (LITHOBID) CR tablet 600 mg  600 mg Oral QHS Clapacs, John T, MD   600 mg at 03/20/21 2129   LORazepam (ATIVAN) tablet 2 mg  2 mg Oral Q6H PRN [name], DO   2 mg at 03/21/21 1107   Or   LORazepam (ATIVAN) injection 2 mg  2 mg Intramuscular Q6H PRN [name], DO       magnesium hydroxide (MILK OF MAGNESIA) suspension 30 mL  30 mL Oral Daily PRN Clapacs, John T, MD       mirtazapine (REMERON) tablet 15 mg  15 mg Oral QHS Clapacs, John T, MD   15 mg at 03/20/21 2139   risperiDONE (RISPERDAL M-TABS) disintegrating tablet 2 mg  2 mg Oral Q8H PRN Clapacs, [name], MD   2 mg at 03/20/21 2228   thiamine tablet 100 mg  100 mg Oral Daily Clapacs, [name], MD   100 mg at 03/21/21 0810   ziprasidone (GEODON) capsule 20 mg  20 mg Oral Daily Patrecia Pour, NP  20 mg at 03/21/21 0810   ziprasidone (GEODON) capsule 40 mg  40 mg Oral QHS Patrecia Pour, NP   40 mg at 03/20/21 2102   PTA Medications: Medications Prior to Admission  Medication Sig Dispense Refill Last Dose   ARIPiprazole (ABILIFY) 10 MG tablet Take 10 mg by mouth daily. Pt takes with 2 mg to equal 12 mg for a total dose (Patient not taking: Reported on 03/19/2021)      ARIPiprazole (ABILIFY) 2 MG tablet Take 2 mg by mouth daily. Pt takes with 10 mg for a total dose of 12 mg. (Patient not taking: Reported on 03/19/2021)      benztropine (COGENTIN) 1 MG tablet Take 1 mg by mouth 2 (two) times daily.      busPIRone (BUSPAR) 15 MG tablet Take 15 mg by mouth  2 (two) times daily.      folic acid (FOLVITE) 1 MG tablet Take 1 tablet (1 mg total) by mouth daily. (Patient not taking: Reported on 03/19/2021) 30 tablet 0    hydrOXYzine (ATARAX/VISTARIL) 25 MG tablet Take 1 tablet (25 mg total) by mouth 3 (three) times daily as needed for anxiety. 30 tablet 0    hydrOXYzine (VISTARIL) 25 MG capsule Take 25 mg by mouth 3 (three) times daily as needed.      lithium carbonate (ESKALITH) 450 MG CR tablet Take 1 tablet (450 mg total) by mouth at bedtime. (Patient not taking: Reported on 03/19/2021) 30 tablet 0    lithium carbonate (LITHOBID) 300 MG CR tablet Take 600 mg by mouth at bedtime.      mirtazapine (REMERON) 15 MG tablet Take 15 mg by mouth at bedtime.      thiamine 100 MG tablet Take 1 tablet (100 mg total) by mouth daily. (Patient not taking: Reported on 03/19/2021) 30 tablet 0    traZODone (DESYREL) 50 MG tablet Take 1 tablet (50 mg total) by mouth at bedtime as needed and may repeat dose one time if needed for sleep. (Patient not taking: Reported on 10/03/2020) 30 tablet 0    ziprasidone (GEODON) 20 MG capsule Take 20 mg by mouth 2 (two) times daily with a meal. Take 20mg  by mouth in the morning, and 40mg  by mouth in the evening. Take with food       Musculoskeletal: Strength & Muscle Tone: within normal limits Gait & Station: normal Patient leans: N/A            Psychiatric Specialty Exam:  Presentation  General Appearance: Bizarre  Eye Contact:Minimal  Speech:Clear and Coherent  Speech Volume:Other (comment)  Handedness:Right   Mood and Affect  Mood:Anxious; Dysphoric; Irritable  Affect:Flat; Blunt; Congruent   Thought Process  Thought Processes:Disorganized  Duration of Psychotic Symptoms: Greater than six months  Past Diagnosis of Schizophrenia or Psychoactive disorder: Yes  Descriptions of Associations:Loose  Orientation:Full (Time, Place and Person)  Thought Content:Delusions; Paranoid Ideation;  Scattered  Hallucinations:No data recorded Ideas of Reference:Delusions; Paranoia  Suicidal Thoughts:No data recorded Homicidal Thoughts:No data recorded  Sensorium  Memory:Immediate Fair; Recent Fair; Remote Fair  Judgment:Poor  Insight:Poor   Executive Functions  Concentration:Fair  Attention Span:Poor  [place]   Psychomotor Activity  Psychomotor Activity:No data recorded  Assets  Assets:Communication Skills; Desire for Improvement   Sleep  Sleep:No data recorded   Physical Exam: Physical Exam Vitals and nursing note reviewed.  Constitutional:      Appearance: Normal appearance. He is normal weight.  HENT:  Head: Normocephalic and atraumatic.     Nose: Nose normal.     Mouth/Throat:     Pharynx: Oropharynx is clear.  Eyes:     Extraocular Movements: Extraocular movements intact.     Pupils: Pupils are equal, round, and reactive to light.  Cardiovascular:     Rate and Rhythm: Normal rate and regular rhythm.     Pulses: Normal pulses.     Heart sounds: Normal heart sounds.  Pulmonary:     Effort: Pulmonary effort is normal.     Breath sounds: Normal breath sounds.  Abdominal:     General: Abdomen is flat. Bowel sounds are normal.     Palpations: Abdomen is soft.  Musculoskeletal:        General: Normal range of motion.     Cervical back: Normal range of motion and neck supple.  Skin:    General: Skin is warm and dry.  Neurological:     General: No focal deficit present.     Mental Status: He is alert and oriented to person, place, and time.  Psychiatric:        Attention and Perception: Attention normal. He perceives auditory hallucinations.        Mood and Affect: Mood normal. Affect is angry.        Speech: Speech normal.        Behavior: Behavior is uncooperative.        Thought Content: Thought content is paranoid.        Cognition and Memory: Cognition and memory normal.        Judgment:  Judgment is impulsive.   Review of Systems  Constitutional: Negative.   HENT: Negative.    Eyes: Negative.   Respiratory: Negative.    Cardiovascular: Negative.   Gastrointestinal: Negative.   Genitourinary: Negative.   Musculoskeletal: Negative.   Skin: Negative.   Neurological: Negative.   Endo/Heme/Allergies: Negative.   Psychiatric/Behavioral:  Positive for hallucinations.   Blood pressure (!) 137/92, pulse 82, temperature 97.7 F (36.5 C), temperature source Oral, resp. rate 16, height 6' (1.829 m), weight 81.6 kg, SpO2 100 %. Body mass index is 24.41 kg/m.  Treatment Plan Summary: Daily contact with patient to assess and evaluate symptoms and progress in treatment, Medication management, and Plan See Orders  Observation Level/Precautions:  15 minute checks  Laboratory:  CBC Chemistry Profile HbAIC UDS  Psychotherapy:    Medications:    Consultations:    Discharge Concerns:    Estimated LOS:  Other:     Physician Treatment Plan for Primary Diagnosis: <principal problem not specified> Long Term Goal(s): Improvement in symptoms so as ready for discharge  Short Term Goals: Ability to identify changes in lifestyle to reduce recurrence of condition will improve, Ability to verbalize feelings will improve, Ability to disclose and discuss suicidal ideas, Ability to demonstrate self-control will improve, Ability to identify and develop effective coping behaviors will improve, Ability to maintain clinical measurements within normal limits will improve, Compliance with prescribed medications will improve, and Ability to identify triggers associated with substance abuse/mental health issues will improve  Physician Treatment Plan for Secondary Diagnosis: Active Problems:   Schizophrenia ([place])   I certify that inpatient services furnished can reasonably be expected to improve the patient's condition.    [name], DO 1/22/202311:52 AM

## 2021-03-21 NOTE — Plan of Care (Signed)
°  Problem: Education: Goal: Knowledge of [facility] Education information/materials will improve 03/21/2021 2311 by Ardelle Anton, RN Outcome: Progressing 03/21/2021 2311 by Ardelle Anton, RN Outcome: Progressing Goal: Emotional status will improve 03/21/2021 2311 by Ardelle Anton, RN Outcome: Progressing 03/21/2021 2311 by Ardelle Anton, RN Outcome: Progressing Goal: Mental status will improve 03/21/2021 2311 by Ardelle Anton, RN Outcome: Progressing 03/21/2021 2311 by Ardelle Anton, RN Outcome: Progressing Goal: Verbalization of understanding the information provided will improve 03/21/2021 2311 by Ardelle Anton, RN Outcome: Progressing 03/21/2021 2311 by Ardelle Anton, RN Outcome: Progressing   Problem: Education: Goal: Ability to state activities that reduce stress will improve 03/21/2021 2311 by Ardelle Anton, RN Outcome: Progressing 03/21/2021 2311 by Ardelle Anton, RN Outcome: Progressing   Problem: Coping: Goal: Ability to identify and develop effective coping behavior will improve 03/21/2021 2311 by Ardelle Anton, RN Outcome: Progressing 03/21/2021 2311 by Ardelle Anton, RN Outcome: Progressing   Problem: Activity: Goal: Will verbalize the importance of balancing activity with adequate rest periods 03/21/2021 2311 by Ardelle Anton, RN Outcome: Progressing 03/21/2021 2311 by Ardelle Anton, RN Outcome: Progressing   Problem: Education: Goal: Will be free of psychotic symptoms 03/21/2021 2311 by Ardelle Anton, RN Outcome: Progressing 03/21/2021 2311 by Ardelle Anton, RN Outcome: Progressing Goal: Knowledge of the prescribed therapeutic regimen will improve 03/21/2021 2311 by Ardelle Anton, RN Outcome: Progressing 03/21/2021 2311 by Ardelle Anton, RN Outcome: Progressing

## 2021-03-21 NOTE — Progress Notes (Signed)
D: Pt alert and oriented. Pt denies experiencing and anxiety/depression at this time, however appears anxious/sullen/paranoid and restless. Pt is unable to sit for more than a very short period of time. Pt reports not sleeping well last night and states he hasn't been sleeping well. Pt denies experiencing any pain at this time. Pt reports experiencing any SI/HI, or AVH at this time, however can be observed as obviously responding to internal stimuli.   At approximately 1036 this morning the pt began pacing the main hallway and shouted something in a loud aggressive voice. Pt then punched the wall in the main hallway which was followed by slamming his door. Pt moments later came back out and began pacing and clearly responding to internal stimuli. This Probation officer notified the txing MD for the day and request prn medication. MD gave orders which were placed and executed. 30 mins after receiving prn meds pt asked what he received and was educated on what was administered by this Probation officer. Pt then stated that whatever it was, was working and wanted to know when he could have it again. Pt was informed how often medication could be administered. Pt later came to the door asking if there was anything he could receive right now. Pt was informed he could have vistaril and what it was for. Pt stated he wanted to take it. Pt has continued to pace hallway but in a calmer demeanor since receiving prn medications.   Pt requested to receive prn oral B52 this evening. Pt shared that he's shocked that it's working for him.   Mother called this evening and did not have the code however pt gave verbal permission for this writer to speak with his mother and let her know how he's been doing today.   After oral B52 it could be noticed that pt's pacing slowed down and agitation was minimal.   Cogentin was not given, per MD hold when giving B52.  A: Scheduled medications administered to pt, per MD orders. Support and encouragement  provided. Frequent verbal contact made. Routine safety checks conducted q15 minutes.   R: No adverse drug reactions noted. Pt verbally contracts for safety at this time. Pt complaint with medications. Pt interacts minimally with others on the unit self isolating to room with exception to meals and pacing the main hallway. Pt remains safe at this time. Will continue to monitor.

## 2021-03-21 NOTE — BHH Suicide Risk Assessment (Signed)
[facility] Admission Suicide Risk Assessment   Nursing information obtained from:  Patient Demographic factors:  Male, Unemployed Current Mental Status:  NA Loss Factors:  NA Historical Factors:  NA Risk Reduction Factors:  Living with another person, especially a relative  Total Time spent with patient: 1 hour Principal Problem: <principal problem not specified> Diagnosis:  Active Problems:   Schizophrenia (HCC)  Subjective Data: Nathan Barnett. is a 31 y.o. male patient presented to [facility] ED via POV with his mom at his side and voluntary. The patient was placed under involuntary commitment status by the EDP. Per the ED triage nurse's note, Pt in via POV w/ mother, patient lives with mother.  Mother feels that his psych meds may need to be adjusted.  Reports worsening aggressive behavior, loud outbursts, and inappropriate language; mother reports behaviors have gradually worsened over the last 6 months since patient has been taken off of Abilify and put on Ziprasidone.  The patient with some outburst in triage, appears to be talking to himself. Denies any visual/auditory hallucinations at this time. The patient presents with internal stimuli, such as asking him if he understands why he is here. The patient shared, "aren't yall supposed to tell me what to do next." The patient often replies with "I don't give a fuck" to some questions. The patient shared that he is prescribed medications by his primary care physician and agreed that he lives with his mother.    The patient was seen face-to-face by this provider; the chart was reviewed and consulted with Dr. Scotty Court on 03/18/2021 due to the patient's care. It was discussed with both providers that the patient does/does not meet the criteria to be admitted to the inpatient unit.  On evaluation, the patient is alert and oriented x,3 withdrawn, irritable, guarded but cooperative, and mood-congruent with affect. The patient does appear to be  responding to internal and external stimuli. The patient is presenting with some delusional thinking. The patient denies auditory or visual hallucinations. The patient is seen mumbling under his breath. The patient denies any suicidal, homicidal, or self-harm ideations. The patient is presenting with some psychotic behaviors.     Continued Clinical Symptoms:  Alcohol Use Disorder Identification Test Final Score (AUDIT): 1 The "Alcohol Use Disorders Identification Test", Guidelines for Use in Primary Care, Second Edition.  [employer] [facility]). Score between 0-7:  no or low risk or alcohol related problems. Score between 8-15:  moderate risk of alcohol related problems. Score between 16-19:  high risk of alcohol related problems. Score 20 or above:  warrants further diagnostic evaluation for alcohol dependence and treatment.   CLINICAL FACTORS:   Schizophrenia:   Paranoid or undifferentiated type   Musculoskeletal: Strength & Muscle Tone: within normal limits Gait & Station: normal Patient leans: N/A  Psychiatric Specialty Exam:  Presentation  General Appearance: Bizarre  Eye Contact:Minimal  Speech:Clear and Coherent  Speech Volume:Other (comment)  Handedness:Right   Mood and Affect  Mood:Anxious; Dysphoric; Irritable  Affect:Flat; Blunt; Congruent   Thought Process  Thought Processes:Disorganized  Descriptions of Associations:Loose  Orientation:Full (Time, Place and Person)  Thought Content:Delusions; Paranoid Ideation; Scattered  History of Schizophrenia/Schizoaffective disorder:Yes  Duration of Psychotic Symptoms:Greater than six months  Hallucinations:No data recorded Ideas of Reference:Delusions; Paranoia  Suicidal Thoughts:No data recorded Homicidal Thoughts:No data recorded  Sensorium  Memory:Immediate Fair; Recent Fair; Remote Fair  Judgment:Poor  Insight:Poor   Executive Functions  Concentration:Fair  Attention  Span:Poor  Recall:Fair  Fund of Knowledge:Fair  Language:Fair  Psychomotor Activity  Psychomotor Activity:No data recorded  Assets  Assets:Communication Skills; Desire for Improvement   Sleep  Sleep:No data recorded   Physical Exam: Physical Exam Vitals and nursing note reviewed.  Constitutional:      Appearance: Normal appearance. He is normal weight.  Neurological:     General: No focal deficit present.     Mental Status: He is alert and oriented to person, place, and time.  Psychiatric:        Attention and Perception: Attention normal. He perceives auditory hallucinations.        Mood and Affect: Mood is depressed. Affect is angry.        Speech: He is noncommunicative.        Behavior: Behavior is uncooperative.        Thought Content: Thought content is paranoid.        Cognition and Memory: Cognition normal.        Judgment: Judgment is impulsive and inappropriate.   Review of Systems  Constitutional: Negative.   HENT: Negative.    Eyes: Negative.   Respiratory: Negative.    Cardiovascular: Negative.   Gastrointestinal: Negative.   Genitourinary: Negative.   Musculoskeletal: Negative.   Skin: Negative.   Neurological: Negative.   Endo/Heme/Allergies: Negative.   Psychiatric/Behavioral:  Positive for hallucinations.   Blood pressure (!) 137/92, pulse 82, temperature 97.7 F (36.5 C), temperature source Oral, resp. rate 16, height 6' (1.829 m), weight 81.6 kg, SpO2 100 %. Body mass index is 24.41 kg/m.   COGNITIVE FEATURES THAT CONTRIBUTE TO RISK:  Closed-mindedness    SUICIDE RISK:   Minimal: No identifiable suicidal ideation.  Patients presenting with no risk factors but with morbid ruminations; may be classified as minimal risk based on the severity of the depressive symptoms  PLAN OF CARE: See orders  I certify that inpatient services furnished can reasonably be expected to improve the patient's condition.   Sarina Ill,  DO 03/21/2021, 11:45 AM

## 2021-03-21 NOTE — Group Note (Signed)
LCSW Group Therapy Note  Group Date: 03/21/2021 Start Time: 1300 End Time: 1345   Type of Therapy and Topic:  Group Therapy - How To Cope with Nervousness about Discharge   Participation Level:  Minimal   Description of Group This process group involved identification of patients' feelings about discharge. Some of them are scheduled to be discharged soon, while others are new admissions, but each of them was asked to share thoughts and feelings surrounding discharge from the hospital. One common theme was that they are excited at the prospect of going home, while another was that many of them are apprehensive about sharing why they were hospitalized. Patients were given the opportunity to discuss these feelings with their peers in preparation for discharge.  Therapeutic Goals  Patient will identify their overall feelings about pending discharge. Patient will think about how they might proactively address issues that they believe will once again arise once they get home (i.e. with parents). Patients will participate in discussion about having hope for change.   Summary of Patient Progress: Patient was present for the entirety of the group session. Patient initially declined to participate in discussion but began sharing half way through group. Patient presented with a blunted affect. Patient shared vague information about his goals post discharge, sharing "Life is what happens to you when you're busy living." Patient shared he plans to "make ramifications on (his) past and shared he plans to move out of his mother's house by the end of the year.    Therapeutic Modalities Cognitive Behavioral Therapy   Marletta Lor 03/21/2021  2:58 PM

## 2021-03-21 NOTE — Progress Notes (Signed)
Patient was irritable, agitated and responding to stimuli on shift. He remains guarded and defensive when engaged in conversation. He was noted to strike the wall multiple times with his fist, he was given PRN's PO mouth and he received an IM injection to assist with behaviors. When the current writer was in the medication room to offer him medication, he struck the printer with his fist. He was offered a snack to assist with deescalating  and he finally went to lay down after an hour and a half later. He has been in bed resting quietly, with no distress noted.

## 2021-03-21 NOTE — Progress Notes (Signed)
Pt visible on the unit, no interaction with peers or staff. He denies SI/HI and AVH. Pt would not dialogue with me, he just answered questions yes or no. Pt observed in room staring out his window for long periods of time, pacing in his room and in the hallways. No signs of agitation or anger. He is quiet and cooperative.

## 2021-03-22 DIAGNOSIS — F203 Undifferentiated schizophrenia: Secondary | ICD-10-CM

## 2021-03-22 LAB — HEMOGLOBIN A1C
Hgb A1c MFr Bld: 4.8 % (ref 4.8–5.6)
Mean Plasma Glucose: 91 mg/dL

## 2021-03-22 MED ORDER — AMLODIPINE BESYLATE 5 MG PO TABS
5.0000 mg | ORAL_TABLET | Freq: Every day | ORAL | Status: DC
  Administered 2021-03-22 – 2021-04-02 (×12): 5 mg via ORAL
  Filled 2021-03-22 (×12): qty 1

## 2021-03-22 MED ORDER — QUETIAPINE FUMARATE 100 MG PO TABS
100.0000 mg | ORAL_TABLET | Freq: Every day | ORAL | Status: DC
  Administered 2021-03-22: 100 mg via ORAL
  Filled 2021-03-22: qty 1

## 2021-03-22 MED ORDER — ARIPIPRAZOLE 10 MG PO TABS: 20.0000 mg | ORAL_TABLET | Freq: Every day | ORAL | Status: DC

## 2021-03-22 NOTE — Progress Notes (Signed)
[facility] MD Progress Note  03/22/2021 3:48 PM [place].  MRN:  [id number] Subjective: Follow-up 31 year old man with schizophrenia or schizoaffective disorder.  Patient seen.  Spoke with his mother as well.  Mother reports patient has been irritable and angry pacing having outbursts although not physically violent.  On interview the patient looked irritable and angry but was clearly making an effort to keep his temper.  He was not hostile or threatening but seems frustrated.  Mother reviewed with me that when he was on Abilify he had what sounds like very severe akathisia.  This was the reason that his medicine was changed to Geodon but that did not appear to be working as well. Principal Problem: Schizophrenia ([place]) Diagnosis: Principal Problem:   Schizophrenia ([place])  Total Time spent with patient: 30 minutes  Past Psychiatric History: Past history of psychotic mood disorder.  Going on several years.  Past Medical History:  Past Medical History:  Diagnosis Date   Mental health problem    History reviewed. No pertinent surgical history. Family History: History reviewed. No pertinent family history. Family Psychiatric  History: See previous Social History:  Social History   Substance and Sexual Activity  Alcohol Use Yes     Social History   Substance and Sexual Activity  Drug Use Not Currently    Social History   Socioeconomic History   Marital status: Single    Spouse name: Not on file   Number of children: Not on file   Years of education: Not on file   Highest education level: Not on file  Occupational History   Not on file  Tobacco Use   Smoking status: Every Day    Packs/day: 2.00    Years: 2.00    Pack years: 4.00    Types: Cigarettes   Smokeless tobacco: Never  Vaping Use   Vaping Use: Never used  Substance and Sexual Activity   Alcohol use: Yes   Drug use: Not Currently   Sexual activity: Not Currently  Other Topics Concern   Not on file  Social  History Narrative   Not on file   Social Determinants of Health   Financial Resource Strain: Not on file  Food Insecurity: Not on file  Transportation Needs: Not on file  Physical Activity: Not on file  Stress: Not on file  Social Connections: Not on file   Additional Social History:                         Sleep: Fair  Appetite:  Fair  Current Medications: Current Facility-Administered Medications  Medication Dose Route Frequency Provider Last Rate Last Admin   acetaminophen (TYLENOL) tablet 650 mg  650 mg Oral Q6H PRN Keoni Risinger T, MD       alum & mag hydroxide-simeth (MAALOX/MYLANTA) 200-200-20 MG/5ML suspension 30 mL  30 mL Oral Q4H PRN Samuell Knoble T, MD       amLODipine (NORVASC) tablet 5 mg  5 mg Oral Daily Heloise Gordan T, MD   5 mg at 03/22/21 1145   benztropine (COGENTIN) tablet 1 mg  1 mg Oral BID Caulder Wehner T, MD   1 mg at 03/22/21 0758   busPIRone (BUSPAR) tablet 15 mg  15 mg Oral BID Patrecia Pour, NP   15 mg at 03/22/21 0758   diphenhydrAMINE (BENADRYL) capsule 100 mg  100 mg Oral Q6H PRN [name], DO   100 mg at 03/21/21 2120  Or   diphenhydrAMINE (BENADRYL) injection 100 mg  100 mg Intramuscular Q6H PRN [name], DO       folic acid (FOLVITE) tablet 1 mg  1 mg Oral Daily Delitha Elms, [name], MD   1 mg at 03/22/21 0758   haloperidol (HALDOL) tablet 10 mg  10 mg Oral Q6H PRN [name], DO   10 mg at 03/21/21 2120   Or   haloperidol lactate (HALDOL) injection 10 mg  10 mg Intramuscular Q6H PRN [name], DO       hydrOXYzine (ATARAX) tablet 25 mg  25 mg Oral TID PRN Daesean Lazarz, [name], MD   25 mg at 03/22/21 1229   lithium carbonate (LITHOBID) CR tablet 600 mg  600 mg Oral QHS Eros Montour T, MD   600 mg at 03/21/21 2119   LORazepam (ATIVAN) tablet 2 mg  2 mg Oral Q6H PRN [name], DO   2 mg at 03/21/21 2119   Or   LORazepam (ATIVAN) injection 2 mg  2 mg Intramuscular Q6H PRN  [name], DO       magnesium hydroxide (MILK OF MAGNESIA) suspension 30 mL  30 mL Oral Daily PRN Ellanor Feuerstein, [name], MD       QUEtiapine (SEROQUEL) tablet 100 mg  100 mg Oral QHS Shakara Tweedy T, MD       risperiDONE (RISPERDAL M-TABS) disintegrating tablet 2 mg  2 mg Oral Q8H PRN Chadric Kimberley, [name], MD   2 mg at 03/20/21 2228   thiamine tablet 100 mg  100 mg Oral Daily Chandler Swiderski, [name], MD   100 mg at 03/22/21 [id number]    Lab Results:  Results for orders placed or performed during the hospital encounter of 03/20/21 (from the past 48 hour(s))  Hemoglobin A1c     Status: None   Collection Time: 03/21/21  9:10 AM  Result Value Ref Range   Hgb A1c MFr Bld 4.8 4.8 - 5.6 %    Comment: (NOTE)         Prediabetes: 5.7 - 6.4         Diabetes: >6.4         Glycemic control for adults with diabetes: <7.0    Mean Plasma Glucose 91 mg/dL    Comment: (NOTE) Performed At: [facility] [address] [place], [place] [id number] Rush Farmer MD [id number]   Lipid panel     Status: Abnormal   Collection Time: 03/21/21  9:10 AM  Result Value Ref Range   Cholesterol 174 0 - 200 mg/dL   Triglycerides 95 <150 mg/dL   HDL 39 (L) >40 mg/dL   Total CHOL/HDL Ratio 4.5 RATIO   VLDL 19 0 - 40 mg/dL   LDL Cholesterol 116 (H) 0 - 99 mg/dL    Comment:        Total Cholesterol/HDL:CHD Risk Coronary Heart Disease Risk Table                     Men   Women  1/2 Average Risk   3.4   3.3  Average Risk       5.0   4.4  2 X Average Risk   9.6   7.1  3 X Average Risk  23.4   11.0        Use the calculated Patient Ratio above and the CHD Risk Table to determine the patient's CHD Risk.        ATP III  CLASSIFICATION (LDL):  <100     mg/dL   Optimal  100-129  mg/dL   Near or Above                    Optimal  130-159  mg/dL   Borderline  160-189  mg/dL   High  >190     mg/dL   Very High Performed at [facility], [address] [place], [place] [address]     Blood Alcohol  level:  Lab Results  Component Value Date   [facility] <10 03/18/2021   ETH <10 0000000    Metabolic Disorder Labs: Lab Results  Component Value Date   HGBA1C 4.8 03/21/2021   MPG 91 03/21/2021   MPG 91.06 12/03/2019   Lab Results  Component Value Date   PROLACTIN 25.4 (H) 12/04/2019   Lab Results  Component Value Date   CHOL 174 03/21/2021   TRIG 95 03/21/2021   HDL 39 (L) 03/21/2021   CHOLHDL 4.5 03/21/2021   VLDL 19 03/21/2021   LDLCALC 116 (H) 03/21/2021   LDLCALC 62 12/03/2019    Physical Findings: AIMS:  , ,  ,  ,    CIWA:    COWS:     Musculoskeletal: Strength & Muscle Tone: within normal limits Gait & Station: normal Patient leans: N/A  Psychiatric Specialty Exam:  Presentation  General Appearance: Bizarre  Eye Contact:Minimal  Speech:Clear and Coherent  Speech Volume:Other (comment)  Handedness:Right   Mood and Affect  Mood:Anxious; Dysphoric; Irritable  Affect:Flat; Blunt; Congruent   Thought Process  Thought Processes:Disorganized  Descriptions of Associations:Loose  Orientation:Full (Time, Place and Person)  Thought Content:Delusions; Paranoid Ideation; Scattered  History of Schizophrenia/Schizoaffective disorder:Yes  Duration of Psychotic Symptoms:Greater than six months  Hallucinations:No data recorded Ideas of Reference:Delusions; Paranoia  Suicidal Thoughts:No data recorded Homicidal Thoughts:No data recorded  Sensorium  Memory:Immediate Fair; Recent Fair; Remote Fair  Judgment:Poor  Insight:Poor   Executive Functions  Concentration:Fair  Attention Span:Poor  [place]   Psychomotor Activity  Psychomotor Activity:No data recorded  Assets  Assets:Communication Skills; Desire for Improvement   Sleep  Sleep:No data recorded   Physical Exam: Physical Exam Vitals reviewed.  Constitutional:      Appearance: Normal appearance.  HENT:     Head: Normocephalic  and atraumatic.     Mouth/Throat:     Pharynx: Oropharynx is clear.  Eyes:     Pupils: Pupils are equal, round, and reactive to light.  Cardiovascular:     Rate and Rhythm: Normal rate and regular rhythm.  Pulmonary:     Effort: Pulmonary effort is normal.     Breath sounds: Normal breath sounds.  Abdominal:     General: Abdomen is flat.     Palpations: Abdomen is soft.  Musculoskeletal:        General: Normal range of motion.  Skin:    General: Skin is warm and dry.  Neurological:     General: No focal deficit present.     Mental Status: He is alert. Mental status is at baseline.  Psychiatric:        Attention and Perception: He is inattentive.        Mood and Affect: Mood normal. Affect is angry.        Speech: Speech is tangential.        Behavior: Behavior is agitated. Behavior is not aggressive.        Thought Content: Thought content normal.  Cognition and Memory: Memory is impaired.   Review of Systems  Constitutional: Negative.   HENT: Negative.    Eyes: Negative.   Respiratory: Negative.    Cardiovascular: Negative.   Gastrointestinal: Negative.   Musculoskeletal: Negative.   Skin: Negative.   Neurological: Negative.   Psychiatric/Behavioral:  Negative for depression, hallucinations, substance abuse and suicidal ideas. The patient is nervous/anxious and has insomnia.   Blood pressure (!) 133/97, pulse (!) 106, temperature (!) 97 F (36.1 C), temperature source Oral, resp. rate 20, height 6' (1.829 m), weight 81.6 kg, SpO2 100 %. Body mass index is 24.41 kg/m.   Treatment Plan Summary: Plan I suggest switching his medicine to Seroquel for primary antipsychotic and mood agent if other medicines are causing intolerable akathisia.  Patient is requesting to be discharged within a couple of days I.  I did not make any promises to him and in trying to get him to be patient and cooperative for now.  Alethia Berthold, MD 03/22/2021, 3:48 PM

## 2021-03-22 NOTE — BH IP Treatment Plan (Signed)
Interdisciplinary Treatment and Diagnostic Plan Update  03/22/2021 Time of Session: 9:30AM Marylene Land [facility]. MRN: [id number]  Principal Diagnosis: <principal problem not specified>  Secondary Diagnoses: Active Problems:   Schizophrenia ([place])   Current Medications:  Current Facility-Administered Medications  Medication Dose Route Frequency Provider Last Rate Last Admin   acetaminophen (TYLENOL) tablet 650 mg  650 mg Oral Q6H PRN Clapacs, John T, MD       alum & mag hydroxide-simeth (MAALOX/MYLANTA) 200-200-20 MG/5ML suspension 30 mL  30 mL Oral Q4H PRN Clapacs, John T, MD       amLODipine (NORVASC) tablet 5 mg  5 mg Oral Daily Clapacs, John T, MD   5 mg at 03/22/21 1145   benztropine (COGENTIN) tablet 1 mg  1 mg Oral BID Clapacs, John T, MD   1 mg at 03/22/21 0758   busPIRone (BUSPAR) tablet 15 mg  15 mg Oral BID Patrecia Pour, NP   15 mg at 03/22/21 0758   diphenhydrAMINE (BENADRYL) capsule 100 mg  100 mg Oral Q6H PRN [name], DO   100 mg at 03/21/21 2120   Or   diphenhydrAMINE (BENADRYL) injection 100 mg  100 mg Intramuscular Q6H PRN [name], DO       folic acid (FOLVITE) tablet 1 mg  1 mg Oral Daily Clapacs, John T, MD   1 mg at 03/22/21 0758   haloperidol (HALDOL) tablet 10 mg  10 mg Oral Q6H PRN [name], DO   10 mg at 03/21/21 2120   Or   haloperidol lactate (HALDOL) injection 10 mg  10 mg Intramuscular Q6H PRN [name], DO       hydrOXYzine (ATARAX) tablet 25 mg  25 mg Oral TID PRN Clapacs, John T, MD   25 mg at 03/22/21 1229   lithium carbonate (LITHOBID) CR tablet 600 mg  600 mg Oral QHS Clapacs, John T, MD   600 mg at 03/21/21 2119   LORazepam (ATIVAN) tablet 2 mg  2 mg Oral Q6H PRN [name], DO   2 mg at 03/21/21 2119   Or   LORazepam (ATIVAN) injection 2 mg  2 mg Intramuscular Q6H PRN [name], DO       magnesium hydroxide (MILK OF MAGNESIA) suspension 30 mL  30 mL Oral Daily  PRN Clapacs, [name], MD       QUEtiapine (SEROQUEL) tablet 100 mg  100 mg Oral QHS Clapacs, John T, MD       risperiDONE (RISPERDAL M-TABS) disintegrating tablet 2 mg  2 mg Oral Q8H PRN Clapacs, [name], MD   2 mg at 03/20/21 2228   thiamine tablet 100 mg  100 mg Oral Daily Clapacs, [name], MD   100 mg at 03/22/21 7078   PTA Medications: Medications Prior to Admission  Medication Sig Dispense Refill Last Dose   ARIPiprazole (ABILIFY) 10 MG tablet Take 10 mg by mouth daily. Pt takes with 2 mg to equal 12 mg for a total dose (Patient not taking: Reported on 03/19/2021)      ARIPiprazole (ABILIFY) 2 MG tablet Take 2 mg by mouth daily. Pt takes with 10 mg for a total dose of 12 mg. (Patient not taking: Reported on 03/19/2021)      benztropine (COGENTIN) 1 MG tablet Take 1 mg by mouth 2 (two) times daily.      busPIRone (BUSPAR) 15 MG tablet Take 15 mg by mouth 2 (two) times daily.  folic acid (FOLVITE) 1 MG tablet Take 1 tablet (1 mg total) by mouth daily. (Patient not taking: Reported on 03/19/2021) 30 tablet 0    hydrOXYzine (ATARAX/VISTARIL) 25 MG tablet Take 1 tablet (25 mg total) by mouth 3 (three) times daily as needed for anxiety. 30 tablet 0    hydrOXYzine (VISTARIL) 25 MG capsule Take 25 mg by mouth 3 (three) times daily as needed.      lithium carbonate (ESKALITH) 450 MG CR tablet Take 1 tablet (450 mg total) by mouth at bedtime. (Patient not taking: Reported on 03/19/2021) 30 tablet 0    lithium carbonate (LITHOBID) 300 MG CR tablet Take 600 mg by mouth at bedtime.      mirtazapine (REMERON) 15 MG tablet Take 15 mg by mouth at bedtime.      thiamine 100 MG tablet Take 1 tablet (100 mg total) by mouth daily. (Patient not taking: Reported on 03/19/2021) 30 tablet 0    traZODone (DESYREL) 50 MG tablet Take 1 tablet (50 mg total) by mouth at bedtime as needed and may repeat dose one time if needed for sleep. (Patient not taking: Reported on 10/03/2020) 30 tablet 0    ziprasidone (GEODON) 20 MG  capsule Take 20 mg by mouth 2 (two) times daily with a meal. Take 69m by mouth in the morning, and 453mby mouth in the evening. 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   Physician Treatment Plan for Primary Diagnosis: <principal problem not specified> Long Term Goal(s): Improvement in symptoms so as ready for discharge   Short Term Goals: Ability to identify changes in lifestyle to reduce recurrence of condition will improve Ability to verbalize feelings will improve Ability to disclose and discuss suicidal ideas Ability to demonstrate self-control will improve Ability to identify and develop effective coping behaviors will improve Ability to maintain clinical measurements within normal limits will improve Compliance with prescribed medications will improve Ability to identify triggers associated with substance abuse/mental health issues will improve  Medication Management: Evaluate patient's response, side effects, and tolerance of medication regimen.  Therapeutic Interventions: 1 to 1 sessions, Unit Group sessions and Medication administration.  Evaluation of Outcomes: Not Met  Physician Treatment Plan for Secondary Diagnosis: Active Problems:   Schizophrenia ([place] Long Term Goal(s): Improvement in symptoms so as ready for discharge   Short Term Goals: Ability to identify changes in lifestyle to reduce recurrence of condition will improve Ability to verbalize feelings will improve Ability to disclose and discuss suicidal ideas Ability to demonstrate self-control will improve Ability to identify and develop effective coping behaviors will improve Ability to maintain clinical  measurements within normal limits will improve Compliance with prescribed medications will improve Ability to identify triggers associated with substance abuse/mental health issues will improve     Medication Management: Evaluate patient's response, side effects, and tolerance of medication regimen.  Therapeutic Interventions: 1 to 1 sessions, Unit Group sessions and Medication administration.  Evaluation of Outcomes: Not Met   RN Treatment Plan for Primary Diagnosis: <principal problem not specified> Long Term Goal(s): Knowledge of disease and therapeutic regimen to maintain health will improve  Short Term Goals: Ability to demonstrate self-control, Ability to participate in decision making will improve, Ability to verbalize  feelings will improve, Ability to disclose and discuss suicidal ideas, Ability to identify and develop effective coping behaviors will improve, and Compliance with prescribed medications will improve  Medication Management: RN will administer medications as ordered by provider, will assess and evaluate patient's response and provide education to patient for prescribed medication. RN will report any adverse and/or side effects to prescribing provider.  Therapeutic Interventions: 1 on 1 counseling sessions, Psychoeducation, Medication administration, Evaluate responses to treatment, Monitor vital signs and CBGs as ordered, Perform/monitor CIWA, COWS, AIMS and Fall Risk screenings as ordered, Perform wound care treatments as ordered.  Evaluation of Outcomes: Not Met   LCSW Treatment Plan for Primary Diagnosis: <principal problem not specified> Long Term Goal(s): Safe transition to appropriate next level of care at discharge, Engage patient in therapeutic group addressing interpersonal concerns.  Short Term Goals: Engage patient in aftercare planning with referrals and resources, Increase social support, Increase ability to appropriately verbalize feelings, Increase  emotional regulation, Facilitate acceptance of mental health diagnosis and concerns, and Increase skills for wellness and recovery  Therapeutic Interventions: Assess for all discharge needs, 1 to 1 time with Social worker, Explore available resources and support systems, Assess for adequacy in community support network, Educate family and significant other(s) on suicide prevention, Complete Psychosocial Assessment, Interpersonal group therapy.  Evaluation of Outcomes: Not Met   Progress in Treatment: Attending groups: No. Participating in groups: No. Taking medication as prescribed: Yes. Toleration medication: Yes. Family/Significant other contact made: No, will contact:  once permission is given.  Patient understands diagnosis: Yes. Discussing patient identified problems/goals with staff: Yes. Medical problems stabilized or resolved: Yes. Denies suicidal/homicidal ideation: Yes. Issues/concerns per patient self-inventory: No. Other: none  New problem(s) identified: No, Describe:  none  New Short Term/Long Term Goal(s): elimination of symptoms of psychosis, medication management for mood stabilization; elimination of SI thoughts; development of comprehensive mental wellness/sobriety plan.   Patient Goals:  "getting my medicine"  Discharge Plan or Barriers: CSW will assist patient in developing appropriate discharge plans.    Reason for Continuation of Hospitalization: Anxiety Depression Medication stabilization  Estimated Length of Stay:  1-7 days   Scribe for Treatment Team: Rozann Lesches, Marlinda Mike 03/22/2021 2:21 PM

## 2021-03-22 NOTE — Progress Notes (Signed)
Patient cooperative during assessment denying SI/HI/AVH. Patient observed interacting appropriately with staff and peers on the unit. Pt compliant with medication administration per MD order. Pt given education, support, and encouragement to be active in his treatment plan. Pt being monitored Q 15 minutes for safety per unit protocol. Pt remains safe on the unit.  °

## 2021-03-22 NOTE — Plan of Care (Signed)
Patient is out of bed and active in the milieu. Appears to be preoccupied but remains cooperative. Pacing, talking to self with no sign of distress. Ate breakfast and received AM medications. Has no concerns so far. Safety monitored per unit protocol.

## 2021-03-22 NOTE — Progress Notes (Signed)
Pt slept throughout the night.

## 2021-03-22 NOTE — Group Note (Signed)
[facility] LCSW Group Therapy Note    Group Date: 03/22/2021 Start Time: 0100 End Time: 0200  Type of Therapy and Topic:  Group Therapy:  Overcoming Obstacles  Participation Level:  [facility] PARTICIPATION LEVEL: Did Not Attend    Description of Group:   In this group patients will be encouraged to explore what they see as obstacles to their own wellness and recovery. They will be guided to discuss their thoughts, feelings, and behaviors related to these obstacles. The group will process together ways to cope with barriers, with attention given to specific choices patients can make. Each patient will be challenged to identify changes they are motivated to make in order to overcome their obstacles. This group will be process-oriented, with patients participating in exploration of their own experiences as well as giving and receiving support and challenge from other group members.  Therapeutic Goals: 1. Patient will identify personal and current obstacles as they relate to admission. 2. Patient will identify barriers that currently interfere with their wellness or overcoming obstacles.  3. Patient will identify feelings, thought process and behaviors related to these barriers. 4. Patient will identify two changes they are willing to make to overcome these obstacles:    Summary of Patient Progress   X   Therapeutic Modalities:   Cognitive Behavioral Therapy Solution Focused Therapy Motivational Interviewing Relapse Prevention Therapy   Jarrett Ables, Student-Social Work

## 2021-03-23 DIAGNOSIS — F203 Undifferentiated schizophrenia: Secondary | ICD-10-CM | POA: Diagnosis not present

## 2021-03-23 MED ORDER — QUETIAPINE FUMARATE 200 MG PO TABS
200.0000 mg | ORAL_TABLET | Freq: Every day | ORAL | Status: DC
  Administered 2021-03-23: 21:00:00 200 mg via ORAL
  Filled 2021-03-23: qty 1

## 2021-03-23 NOTE — Progress Notes (Signed)
[id number] MD Progress Note  03/23/2021 4:24 PM Nathan Guardiananiel Lamont Almedia BallsSneed Barnett.  MRN:  [id number] Subjective: Follow-up for this 31 year old man with schizoaffective disorder or schizophrenia.  Patient spends almost all of his time pacing on the ward.  I tried to ask him whether this was based on an internal feeling of restlessness but just asking the questions seem to make him paranoid.  Denies suicidal ideation.  Still a little unrealistic about outpatient plans with limited insight.  No dangerous or aggressive behavior Principal Problem: Schizophrenia (HCC) Diagnosis: Principal Problem:   Schizophrenia (HCC)  Total Time spent with patient: 30 minutes  Past Psychiatric History: Past history of schizophrenia or schizoaffective disorder  Past Medical History:  Past Medical History:  Diagnosis Date   Mental health problem    History reviewed. No pertinent surgical history. Family History: History reviewed. No pertinent family history. Family Psychiatric  History: See previous Social History:  Social History   Substance and Sexual Activity  Alcohol Use Yes     Social History   Substance and Sexual Activity  Drug Use Not Currently    Social History   Socioeconomic History   Marital status: Single    Spouse name: Not on file   Number of children: Not on file   Years of education: Not on file   Highest education level: Not on file  Occupational History   Not on file  Tobacco Use   Smoking status: Every Day    Packs/day: 2.00    Years: 2.00    Pack years: 4.00    Types: Cigarettes   Smokeless tobacco: Never  Vaping Use   Vaping Use: Never used  Substance and Sexual Activity   Alcohol use: Yes   Drug use: Not Currently   Sexual activity: Not Currently  Other Topics Concern   Not on file  Social History Narrative   Not on file   Social Determinants of Health   Financial Resource Strain: Not on file  Food Insecurity: Not on file  Transportation Needs: Not on file  Physical  Activity: Not on file  Stress: Not on file  Social Connections: Not on file   Additional Social History:                         Sleep: Fair  Appetite:  Fair  Current Medications: Current Facility-Administered Medications  Medication Dose Route Frequency Provider Last Rate Last Admin   acetaminophen (TYLENOL) tablet 650 mg  650 mg Oral Q6H PRN Alonzo Loving T, MD   650 mg at 03/23/21 0814   alum & mag hydroxide-simeth (MAALOX/MYLANTA) 200-200-20 MG/5ML suspension 30 mL  30 mL Oral Q4H PRN Alexandru Moorer T, MD       amLODipine (NORVASC) tablet 5 mg  5 mg Oral Daily Delcia Spitzley T, MD   5 mg at 03/23/21 0814   benztropine (COGENTIN) tablet 1 mg  1 mg Oral BID Kermitt Harjo T, MD   1 mg at 03/23/21 0814   busPIRone (BUSPAR) tablet 15 mg  15 mg Oral BID [name], Jamison Y, NP   15 mg at 03/23/21 0814   diphenhydrAMINE (BENADRYL) capsule 100 mg  100 mg Oral Q6H PRN [name], [name], DO   100 mg at 03/22/21 2121   Or   diphenhydrAMINE (BENADRYL) injection 100 mg  100 mg Intramuscular Q6H PRN [name], [name], DO       folic acid (FOLVITE) tablet 1 mg  1 mg Oral Daily  Joshue Badal, Jackquline Denmark, MD   1 mg at 03/23/21 6010   haloperidol (HALDOL) tablet 10 mg  10 mg Oral Q6H PRN Sarina Ill, DO   10 mg at 03/21/21 2120   Or   haloperidol lactate (HALDOL) injection 10 mg  10 mg Intramuscular Q6H PRN Sarina Ill, DO       hydrOXYzine (ATARAX) tablet 25 mg  25 mg Oral TID PRN Shawn Dannenberg T, MD   25 mg at 03/23/21 1441   lithium carbonate (LITHOBID) CR tablet 600 mg  600 mg Oral QHS Jace Dowe T, MD   600 mg at 03/22/21 2119   LORazepam (ATIVAN) tablet 2 mg  2 mg Oral Q6H PRN Sarina Ill, DO   2 mg at 03/22/21 2120   Or   LORazepam (ATIVAN) injection 2 mg  2 mg Intramuscular Q6H PRN Sarina Ill, DO       magnesium hydroxide (MILK OF MAGNESIA) suspension 30 mL  30 mL Oral Daily PRN Shyheim Tanney, Jackquline Denmark, MD       QUEtiapine (SEROQUEL) tablet  200 mg  200 mg Oral QHS Briyan Kleven T, MD       risperiDONE (RISPERDAL M-TABS) disintegrating tablet 2 mg  2 mg Oral Q8H PRN Merrill Villarruel, Jackquline Denmark, MD   2 mg at 03/20/21 2228   thiamine tablet 100 mg  100 mg Oral Daily Zaylah Blecha, Jackquline Denmark, MD   100 mg at 03/23/21 9323    Lab Results: No results found for this or any previous visit (from the past 48 hour(s)).  Blood Alcohol level:  Lab Results  Component Value Date   ETH <10 03/18/2021   ETH <10 10/03/2020    Metabolic Disorder Labs: Lab Results  Component Value Date   HGBA1C 4.8 03/21/2021   MPG 91 03/21/2021   MPG 91.06 12/03/2019   Lab Results  Component Value Date   PROLACTIN 25.4 (H) 12/04/2019   Lab Results  Component Value Date   CHOL 174 03/21/2021   TRIG 95 03/21/2021   HDL 39 (L) 03/21/2021   CHOLHDL 4.5 03/21/2021   VLDL 19 03/21/2021   LDLCALC 116 (H) 03/21/2021   LDLCALC 62 12/03/2019    Physical Findings: AIMS:  , ,  ,  ,    CIWA:    COWS:     Musculoskeletal: Strength & Muscle Tone: within normal limits Gait & Station: normal Patient leans: N/A  Psychiatric Specialty Exam:  Presentation  General Appearance: Bizarre  Eye Contact:Minimal  Speech:Clear and Coherent  Speech Volume:Other (comment)  Handedness:Right   Mood and Affect  Mood:Anxious; Dysphoric; Irritable  Affect:Flat; Blunt; Congruent   Thought Process  Thought Processes:Disorganized  Descriptions of Associations:Loose  Orientation:Full (Time, Place and Person)  Thought Content:Delusions; Paranoid Ideation; Scattered  History of Schizophrenia/Schizoaffective disorder:Yes  Duration of Psychotic Symptoms:Greater than six months  Hallucinations:No data recorded Ideas of Reference:Delusions; Paranoia  Suicidal Thoughts:No data recorded Homicidal Thoughts:No data recorded  Sensorium  Memory:Immediate Fair; Recent Fair; Remote Fair  Judgment:Poor  Insight:Poor   Executive Functions  Concentration:Fair  Attention  Span:Poor  Recall:Fair  Fund of Knowledge:Fair  Language:Fair   Psychomotor Activity  Psychomotor Activity:No data recorded  Assets  Assets:Communication Skills; Desire for Improvement   Sleep  Sleep:No data recorded   Physical Exam: Physical Exam Vitals and nursing note reviewed.  Constitutional:      Appearance: Normal appearance.  HENT:     Head: Normocephalic and atraumatic.     Mouth/Throat:  Pharynx: Oropharynx is clear.  Eyes:     Pupils: Pupils are equal, round, and reactive to light.  Cardiovascular:     Rate and Rhythm: Normal rate and regular rhythm.  Pulmonary:     Effort: Pulmonary effort is normal.     Breath sounds: Normal breath sounds.  Abdominal:     General: Abdomen is flat.     Palpations: Abdomen is soft.  Musculoskeletal:        General: Normal range of motion.  Skin:    General: Skin is warm and dry.  Neurological:     General: No focal deficit present.     Mental Status: He is alert. Mental status is at baseline.  Psychiatric:        Attention and Perception: He is inattentive.        Mood and Affect: Mood normal. Affect is blunt.        Speech: Speech is delayed.        Behavior: Behavior is slowed.        Thought Content: Thought content is paranoid.   Review of Systems  Constitutional: Negative.   HENT: Negative.    Eyes: Negative.   Respiratory: Negative.    Cardiovascular: Negative.   Gastrointestinal: Negative.   Musculoskeletal: Negative.   Skin: Negative.   Neurological: Negative.   Psychiatric/Behavioral: Negative.    Blood pressure (!) 127/91, pulse 93, temperature 97.9 F (36.6 C), temperature source Oral, resp. rate 17, height 6' (1.829 m), weight 81.6 kg, SpO2 96 %. Body mass index is 24.41 kg/m.   Treatment Plan Summary: Medication management and Plan continue medication management but increase Seroquel dose to 200 mg at night.  Explained several times to patient about my concern for akathisia and how we  were trying to avoid it.  Patient is still fixated on the idea of discharge Thursday.  Mordecai Rasmussen, MD 03/23/2021, 4:24 PM

## 2021-03-23 NOTE — Plan of Care (Signed)
Patient is out in the milieu. He continues to pace but denies concerns. He reports that he did not sleep much last night "but it is also good to stay awake and meditate..". Ate breakfast and received his AM medications. Safety precautions reinforced.

## 2021-03-23 NOTE — Progress Notes (Signed)
Recreation Therapy Notes  INPATIENT RECREATION TR PLAN  Patient Details Name: Nathan Barnett. MRN: [id number] DOB: 05/07/1990 Today's Date: 03/23/2021  Rec Therapy Plan Is patient appropriate for Therapeutic Recreation?: Yes Treatment times per week: at least 3 Estimated Length of Stay: 5-7 days TR Treatment/Interventions: Group participation (Comment)  Discharge Criteria Pt will be discharged from therapy if:: Discharged Treatment plan/goals/alternatives discussed and agreed upon by:: Patient/family  Discharge Summary     Kaydynce Pat 03/23/2021, 4:18 PM

## 2021-03-23 NOTE — Group Note (Signed)
[facility] LCSW Group Therapy Note ° ° °Group Date: 03/23/2021 °Start Time: 0130 °End Time: 0215 ° °Type of Therapy/Topic:  Group Therapy:  Feelings about Diagnosis ° °Participation Level:  Did Not Attend  ° ° °Description of Group:   ° This group will allow patients to explore their thoughts and feelings about diagnoses they have received. Patients will be guided to explore their level of understanding and acceptance of these diagnoses. Facilitator will encourage patients to process their thoughts and feelings about the reactions of others to their diagnosis, and will guide patients in identifying ways to discuss their diagnosis with significant others in their lives. This group will be process-oriented, with patients participating in exploration of their own experiences as well as giving and receiving support and challenge from other group members. ° ° °Therapeutic Goals: °1. Patient will demonstrate understanding of diagnosis as evidence by identifying two or more symptoms of the disorder:  °2. Patient will be able to express two feelings regarding the diagnosis °3. Patient will demonstrate ability to communicate their needs through discussion and/or role plays ° °Summary of Patient Progress: ° °X ° ° ° ° ° °Therapeutic Modalities:   °Cognitive Behavioral Therapy °Brief Therapy °Feelings Identification  ° ° °Kayler Buckholtz, Student-Social Work °

## 2021-03-23 NOTE — Progress Notes (Signed)
Patient cooperative during assessment denying SI/HI/AVH. Patient observed interacting appropriately with staff and peers on the unit. Pt compliant with medication administration per MD order. Pt given education, support, and encouragement to be active in his treatment plan. Pt being monitored Q 15 minutes for safety per unit protocol. Pt remains safe on the unit.

## 2021-03-23 NOTE — Progress Notes (Signed)
Recreation Therapy Notes  INPATIENT RECREATION THERAPY ASSESSMENT  Patient Details Name: Nathan Barnett. MRN: [id number] DOB: 1990-11-24 Today's Date: 03/23/2021       Information Obtained From: Patient  Able to Participate in Assessment/Interview: Yes  Patient Presentation: Responsive  Reason for Admission (Per Patient): Patient Unable to Identify  Patient Stressors: Family  Coping Skills:   Isolation, Avoidance  Leisure Interests (2+):   (Nothing)  Frequency of Recreation/Participation:    Awareness of Community Resources:  No  Community Resources:     Current Use:    If no, Barriers?:    Expressed Interest in [place]: No  County of Residence:  [place] under[place]  Patient Main Form of Transportation: Musician  Patient Strengths:  N/A  Patient Identified Areas of Improvement:  N/A  Patient Goal for Hospitalization:  Medication  Current SI (including self-harm):  No  Current HI:  No  Current AVH: No  Staff Intervention Plan: Group Attendance, Collaborate with Interdisciplinary Treatment Team  Consent to Intern Participation: N/A  Nathan Barnett 03/23/2021, 4:17 PM

## 2021-03-23 NOTE — Progress Notes (Signed)
Recreation Therapy Notes  Date: 03/23/2021  Time: 10:05 am    Location: Craft room   Behavioral response: Appropriate   Intervention Topic: Time Management     Discussion/Intervention:  Group content today was focused on time management. The group defined time management and identified healthy ways to manage time. Individuals expressed how much of the 24 hours they use in a day. Patients expressed how much time they use just for themselves personally. The group expressed how they have managed their time in the past. Individuals participated in the intervention Managing Life where they had a chance to see how much of the 24 hours they use and where it goes.  Clinical Observations/Feedback: Patient came to group and defined time management as accountability. He stated that he manages his time by procrastinating. Participant rated his time management skills as a 1/10. Individual left group early and did not return. Vincente Asbridge LRT/CTRS            Ephraim Reichel 03/23/2021 11:10 AM

## 2021-03-24 DIAGNOSIS — F203 Undifferentiated schizophrenia: Secondary | ICD-10-CM | POA: Diagnosis not present

## 2021-03-24 MED ORDER — QUETIAPINE FUMARATE 200 MG PO TABS
300.0000 mg | ORAL_TABLET | Freq: Every day | ORAL | Status: DC
  Administered 2021-03-24: 21:00:00 300 mg via ORAL
  Filled 2021-03-24: qty 1

## 2021-03-24 NOTE — Progress Notes (Signed)
Recreation Therapy Notes    Date: 03/24/2021  Time: 10:30   Location: Craft room      Behavioral response: N/A   Intervention Topic: Happiness    Discussion/Intervention: Patient refused to attend group.   Clinical Observations/Feedback:  Patient refused to attend group.    Chyenne Sobczak LRT/CTRS        Lawrance Wiedemann 03/24/2021 12:54 PM

## 2021-03-24 NOTE — Progress Notes (Signed)
D: Pt alert and oriented. Pt rates depression 0/10, hopelessness 0/10, and anxiety 0/10. Pt goal: "Sleep." Pt reports energy level as normal and concentration as being good. Pt reports sleep last night as being good. Pt did receive medications for sleep and did find them helpful. Pt denies experiencing any pain at this time. Pt denies experiencing any SI/HI, or AVH at this time, however can be observed responding to internal stimuli while restlessly pacing the main hallway, offered and willingly took prn meds.   A: Scheduled medications administered to pt, per MD orders. Support and encouragement provided. Frequent verbal contact made. Routine safety checks conducted q15 minutes.   R: No adverse drug reactions noted. Pt verbally contracts for safety at this time. Pt complaint with medications and treatment plan. Pt interacts minimally with others on the unit. Pt remains safe at this time. Will continue to monitor.

## 2021-03-24 NOTE — Group Note (Signed)
[facility] LCSW Group Therapy Note   Group Date: 02/01/2022 Start Time: 1300 End Time: 1400   Type of Therapy/Topic:  Group Therapy:  Emotion Regulation  Participation Level:  Did Not Attend   Mood:  Description of Group:    The purpose of this group is to assist patients in learning to regulate negative emotions and experience positive emotions. Patients will be guided to discuss ways in which they have been vulnerable to their negative emotions. These vulnerabilities will be juxtaposed with experiences of positive emotions or situations, and patients challenged to use positive emotions to combat negative ones. Special emphasis will be placed on coping with negative emotions in conflict situations, and patients will process healthy conflict resolution skills.  Therapeutic Goals: Patient will identify two positive emotions or experiences to reflect on in order to balance out negative emotions:  Patient will label two or more emotions that they find the most difficult to experience:  Patient will be able to demonstrate positive conflict resolution skills through discussion or role plays:   Summary of Patient Progress:   Patient did not attend group despite encouraged participation.     Therapeutic Modalities:   Cognitive Behavioral Therapy Feelings Identification Dialectical Behavioral Therapy   Angelus Hoopes W Nilda Keathley, LCSWA 

## 2021-03-24 NOTE — Progress Notes (Signed)
[facility] MD Progress Note  03/24/2021 11:20 AM Nathan Barnett.  MRN:  [id number] Subjective: Follow-up for 31 year old man with schizophrenia.  He still paces a little bit but not nearly as bad as previously making me think that we are succeeding in relieving some akathisia.  On interview the patient still is somewhat dismissive and focused just on discharge planning rather than any symptom improvement.  Gets a little irritated at attempts to discuss symptoms.  Still seems paranoid at times but no aggression or violence. Principal Problem: Schizophrenia (HCC) Diagnosis: Principal Problem:   Schizophrenia (HCC)  Total Time spent with patient: 30 minutes  Past Psychiatric History: Past history of schizophrenia  Past Medical History:  Past Medical History:  Diagnosis Date   Mental health problem    History reviewed. No pertinent surgical history. Family History: History reviewed. No pertinent family history. Family Psychiatric  History: See previous Social History:  Social History   Substance and Sexual Activity  Alcohol Use Yes     Social History   Substance and Sexual Activity  Drug Use Not Currently    Social History   Socioeconomic History   Marital status: Single    Spouse name: Not on file   Number of children: Not on file   Years of education: Not on file   Highest education level: Not on file  Occupational History   Not on file  Tobacco Use   Smoking status: Every Day    Packs/day: 2.00    Years: 2.00    Pack years: 4.00    Types: Cigarettes   Smokeless tobacco: Never  Vaping Use   Vaping Use: Never used  Substance and Sexual Activity   Alcohol use: Yes   Drug use: Not Currently   Sexual activity: Not Currently  Other Topics Concern   Not on file  Social History Narrative   Not on file   Social Determinants of Health   Financial Resource Strain: Not on file  Food Insecurity: Not on file  Transportation Needs: Not on file  Physical Activity: Not  on file  Stress: Not on file  Social Connections: Not on file   Additional Social History:                         Sleep: Fair  Appetite:  Fair  Current Medications: Current Facility-Administered Medications  Medication Dose Route Frequency Provider Last Rate Last Admin   acetaminophen (TYLENOL) tablet 650 mg  650 mg Oral Q6H PRN Dalila Arca T, MD   650 mg at 03/23/21 0814   alum & mag hydroxide-simeth (MAALOX/MYLANTA) 200-200-20 MG/5ML suspension 30 mL  30 mL Oral Q4H PRN Chantea Surace T, MD       amLODipine (NORVASC) tablet 5 mg  5 mg Oral Daily Rowland Ericsson T, MD   5 mg at 03/24/21 0752   benztropine (COGENTIN) tablet 1 mg  1 mg Oral BID Thamar Holik T, MD   1 mg at 03/24/21 0752   busPIRone (BUSPAR) tablet 15 mg  15 mg Oral BID Charm Rings, NP   15 mg at 03/24/21 0751   diphenhydrAMINE (BENADRYL) capsule 100 mg  100 mg Oral Q6H PRN Sarina Ill, DO   100 mg at 03/24/21 1039   Or   diphenhydrAMINE (BENADRYL) injection 100 mg  100 mg Intramuscular Q6H PRN Sarina Ill, DO       folic acid (FOLVITE) tablet 1 mg  1 mg Oral Daily  Randolf Sansoucie, Jackquline DenmarkJohn T, MD   1 mg at 03/24/21 [id number]   haloperidol (HALDOL) tablet 10 mg  10 mg Oral Q6H PRN [name], [name], DO   10 mg at 03/24/21 1039   Or   haloperidol lactate (HALDOL) injection 10 mg  10 mg Intramuscular Q6H PRN [name], [name], DO       hydrOXYzine (ATARAX) tablet 25 mg  25 mg Oral TID PRN Rome Schlauch, Jackquline DenmarkJohn T, MD   25 mg at 03/24/21 [id number]   lithium carbonate (LITHOBID) CR tablet 600 mg  600 mg Oral QHS Sheritha Louis T, MD   600 mg at 03/23/21 2121   LORazepam (ATIVAN) tablet 2 mg  2 mg Oral Q6H PRN [name], [name], DO   2 mg at 03/24/21 1039   Or   LORazepam (ATIVAN) injection 2 mg  2 mg Intramuscular Q6H PRN [name], [name], DO       magnesium hydroxide (MILK OF MAGNESIA) suspension 30 mL  30 mL Oral Daily PRN Phineas Mcenroe, Jackquline DenmarkJohn T, MD       QUEtiapine (SEROQUEL) tablet 300 mg  300 mg  Oral QHS Kenney Going T, MD       risperiDONE (RISPERDAL M-TABS) disintegrating tablet 2 mg  2 mg Oral Q8H PRN Patsey Pitstick, Jackquline DenmarkJohn T, MD   2 mg at 03/20/21 2228   thiamine tablet 100 mg  100 mg Oral Daily Refugio Vandevoorde, Jackquline DenmarkJohn T, MD   100 mg at 03/24/21 [id number]    Lab Results: No results found for this or any previous visit (from the past 48 hour(s)).  Blood Alcohol level:  Lab Results  Component Value Date   ETH <10 03/18/2021   ETH <10 10/03/2020    Metabolic Disorder Labs: Lab Results  Component Value Date   HGBA1C 4.8 03/21/2021   MPG 91 03/21/2021   MPG 91.06 12/03/2019   Lab Results  Component Value Date   PROLACTIN 25.4 (H) 12/04/2019   Lab Results  Component Value Date   CHOL 174 03/21/2021   TRIG 95 03/21/2021   HDL 39 (L) 03/21/2021   CHOLHDL 4.5 03/21/2021   VLDL 19 03/21/2021   LDLCALC 116 (H) 03/21/2021   LDLCALC 62 12/03/2019    Physical Findings: AIMS:  , ,  ,  ,    CIWA:    COWS:     Musculoskeletal: Strength & Muscle Tone: within normal limits Gait & Station: normal Patient leans: N/A  Psychiatric Specialty Exam:  Presentation  General Appearance: Bizarre  Eye Contact:Minimal  Speech:Clear and Coherent  Speech Volume:Other (comment)  Handedness:Right   Mood and Affect  Mood:Anxious; Dysphoric; Irritable  Affect:Flat; Blunt; Congruent   Thought Process  Thought Processes:Disorganized  Descriptions of Associations:Loose  Orientation:Full (Time, Place and Person)  Thought Content:Delusions; Paranoid Ideation; Scattered  History of Schizophrenia/Schizoaffective disorder:Yes  Duration of Psychotic Symptoms:Greater than six months  Hallucinations:No data recorded Ideas of Reference:Delusions; Paranoia  Suicidal Thoughts:No data recorded Homicidal Thoughts:No data recorded  Sensorium  Memory:Immediate Fair; Recent Fair; Remote Fair  Judgment:Poor  Insight:Poor   Executive Functions  Concentration:Fair  Attention  Span:Poor  Recall:Fair  Fund of Knowledge:Fair  Language:Fair   Psychomotor Activity  Psychomotor Activity:No data recorded  Assets  Assets:Communication Skills; Desire for Improvement   Sleep  Sleep:No data recorded   Physical Exam: Physical Exam Vitals and nursing note reviewed.  Constitutional:      Appearance: Normal appearance.  HENT:     Head: Normocephalic and atraumatic.     Mouth/Throat:  Pharynx: Oropharynx is clear.  Eyes:     Pupils: Pupils are equal, round, and reactive to light.  Cardiovascular:     Rate and Rhythm: Normal rate and regular rhythm.  Pulmonary:     Effort: Pulmonary effort is normal.     Breath sounds: Normal breath sounds.  Abdominal:     General: Abdomen is flat.     Palpations: Abdomen is soft.  Musculoskeletal:        General: Normal range of motion.  Skin:    General: Skin is warm and dry.  Neurological:     General: No focal deficit present.     Mental Status: He is alert. Mental status is at baseline.  Psychiatric:        Attention and Perception: He is inattentive.        Mood and Affect: Mood normal. Affect is blunt.        Speech: Speech is delayed.        Behavior: Behavior is slowed.        Thought Content: Thought content is paranoid.   Review of Systems  Constitutional: Negative.   HENT: Negative.    Eyes: Negative.   Respiratory: Negative.    Cardiovascular: Negative.   Gastrointestinal: Negative.   Musculoskeletal: Negative.   Skin: Negative.   Neurological: Negative.   Psychiatric/Behavioral: Negative.    Blood pressure 127/85, pulse 87, temperature 97.9 F (36.6 C), temperature source Oral, resp. rate 17, height 6' (1.829 m), weight 81.6 kg, SpO2 95 %. Body mass index is 24.41 kg/m.   Treatment Plan Summary: Medication management and Plan continue current medicine plan to increase the Seroquel.  300 mg dose starting tonight.  Patient still fixated on discharge soon and while I do not know that he  will be ready tomorrow we may be able to look at discharge within a couple days if he continues to improve.  Mordecai Rasmussen, MD 03/24/2021, 11:20 AM

## 2021-03-24 NOTE — Progress Notes (Signed)
Patient cooperative during assessment denying SI/HI/AVH. Patient observed interacting appropriately with staff and peers on the unit. Pt compliant with medication administration per MD order. Pt given education, support, and encouragement to be active in his treatment plan. Pt being monitored Q 15 minutes for safety per unit protocol. Pt remains safe on the unit.   Pt mother told staff that she was informed by the charge RN on Monday that the Pt was going to D/C tonight. Mother wants to talk to the doctor in the morning (1/26/)

## 2021-03-25 DIAGNOSIS — F203 Undifferentiated schizophrenia: Secondary | ICD-10-CM | POA: Diagnosis not present

## 2021-03-25 MED ORDER — QUETIAPINE FUMARATE 200 MG PO TABS
400.0000 mg | ORAL_TABLET | Freq: Every day | ORAL | Status: DC
  Administered 2021-03-25 – 2021-04-01 (×8): 400 mg via ORAL
  Filled 2021-03-25 (×8): qty 2

## 2021-03-25 NOTE — [facility] Counselor (Signed)
CSW spoke with patient regarding aftercare plans.  Pt became preoccupied with asking CSW if he would be leaving the hospital day and CSW had to explain several times that discharge date has not been identified.  When CSW could not state that he would be discharged pt declined aftercare plans.  CSW asked if she could permission from pt to complete SPE with patient's family and/or friend.  Pt declined.   Penni Homans, MSW, LCSW 03/25/2021 10:36 AM

## 2021-03-25 NOTE — Progress Notes (Signed)
Recreation Therapy Notes    Date: 03/25/2021  Time: 11:00am    Location: Craft room      Behavioral response: N/A   Intervention Topic: Stress Management   Discussion/Intervention: Patient refused to attend group.   Clinical Observations/Feedback:  Patient refused to attend group.    Kimbly Eanes LRT/CTRS        Anika Shore 03/25/2021 1:04 PM

## 2021-03-25 NOTE — [facility] Suicide Risk Assessment (Signed)
[facility] INPATIENT:  Family/Significant Other Suicide Prevention Education  Suicide Prevention Education:  Contact Attempts: Consuela Mimes, mother, [phone], has been identified by the patient as the family member/significant other with whom the patient will be residing, and identified as the person(s) who will aid the patient in the event of a mental health crisis.  With written consent from the patient, two attempts were made to provide suicide prevention education, prior to and/or following the patient's discharge.  We were unsuccessful in providing suicide prevention education.  A suicide education pamphlet was given to the patient to share with family/significant other.  Date and time of first attempt: 03/25/2021 4:31PM Date and time of second attempt: Second attempt is needed.  CSW left HIPAA compliant voicemail.  Nathan Barnett 03/25/2021, 4:32 PM

## 2021-03-25 NOTE — Plan of Care (Addendum)
°  Problem: Education: Goal: Knowledge of [place] [facility] Education information/materials will improve Outcome: Not Progressing Goal: Emotional status will improve Outcome: Not Progressing Goal: Mental status will improve Outcome: Not Progressing Goal: Verbalization of understanding the information provided will improve Outcome: Not Progressing   Problem: Education: Goal: Ability to state activities that reduce stress will improve Outcome: Not Progressing   Problem: Coping: Goal: Ability to identify and develop effective coping behavior will improve Outcome: Not Progressing   Problem: Coping: Goal: Ability to identify and develop effective coping behavior will improve Outcome: Not Progressing   Problem: Activity: Goal: Will verbalize the importance of balancing activity with adequate rest periods Outcome: Not Progressing   Problem: Education: Goal: Will be free of psychotic symptoms Outcome: Not Progressing Goal: Knowledge of the prescribed therapeutic regimen will improve Outcome: Not Progressing   Problem: Education: Goal: Will be free of psychotic symptoms Outcome: Not Progressing Goal: Knowledge of the prescribed therapeutic regimen will improve Outcome: Not Progressing

## 2021-03-25 NOTE — Group Note (Signed)
[facility] LCSW Group Therapy Note ° ° °Group Date: 03/25/2021 °Start Time: 1300 °End Time: 1400 ° ° °Type of Therapy/Topic:  Group Therapy:  Balance in Life ° °Participation Level:  Did Not Attend  ° °Description of Group:   ° This group will address the concept of balance and how it feels and looks when one is unbalanced. Patients will be encouraged to process areas in their lives that are out of balance, and identify reasons for remaining unbalanced. Facilitators will guide patients utilizing problem- solving interventions to address and correct the stressor making their life unbalanced. Understanding and applying boundaries will be explored and addressed for obtaining  and maintaining a balanced life. Patients will be encouraged to explore ways to assertively make their unbalanced needs known to significant others in their lives, using other group members and facilitator for support and feedback. ° °Therapeutic Goals: °Patient will identify two or more emotions or situations they have that consume much of in their lives. °Patient will identify signs/triggers that life has become out of balance:  °Patient will identify two ways to set boundaries in order to achieve balance in their lives:  °Patient will demonstrate ability to communicate their needs through discussion and/or role plays ° °Summary of Patient Progress: ° ° ° °X ° ° ° °Therapeutic Modalities:   °Cognitive Behavioral Therapy °Solution-Focused Therapy °Assertiveness Training ° ° °Amad Mau J Laquitta Dominski, LCSW °

## 2021-03-25 NOTE — Progress Notes (Signed)
Patient cooperative during assessment denying SI/HI/AVH. Patient observed interacting appropriately with staff and peers on the unit. Pt compliant with medication administration per MD order. Pt given education, support, and encouragement to be active in his treatment plan. Pt being monitored Q 15 minutes for safety per unit protocol. Pt remains safe on the unit.  °

## 2021-03-25 NOTE — Progress Notes (Signed)
Patient alert and oriented this shift. Present on the milieu and pacing throughout the day. Patient denies SI/HI/AVH, yet seen responding to internal stimuli. Has periods of verbal outburst. c/o pain to left foot. See pain assessment for details. Denies anxiety and depression.   Labs and vital signs monitored. Patient supported emotionally and encouraged to verbalize concerns. Cont Q 15 minute check for safety.

## 2021-03-25 NOTE — Progress Notes (Signed)
[name]  03/25/2021 2:59 PM Nathan Guardiananiel Lamont Almedia BallsSneed Jr.  MRN:  [id number] Subjective: Patient seen and chart reviewed.  Also spoke with his mother.  Patient had called his mother last night and told her that he was being discharged of course this not being true.  Patient still is very guarded and withdrawn.  Does not really want to communicate or talk with anyone else.  Not aggressive or hostile.  He was share a little bit of information and plans for the future but not much.  Taking his medicine.  Akathetic pacing seems to be better Principal Problem: Schizophrenia (HCC) Diagnosis: Principal Problem:   Schizophrenia (HCC)  Total Time spent with patient: 30 minutes  Past Psychiatric History: Past history of recurrent psychosis  Past Medical History:  Past Medical History:  Diagnosis Date   Mental health problem    History reviewed. No pertinent surgical history. Family History: History reviewed. No pertinent family history. Family Psychiatric  History: See previous Social History:  Social History   Substance and Sexual Activity  Alcohol Use Yes     Social History   Substance and Sexual Activity  Drug Use Not Currently    Social History   Socioeconomic History   Marital status: Single    Spouse name: Not on file   Number of children: Not on file   Years of education: Not on file   Highest education level: Not on file  Occupational History   Not on file  Tobacco Use   Smoking status: Every Day    Packs/day: 2.00    Years: 2.00    Pack years: 4.00    Types: Cigarettes   Smokeless tobacco: Never  Vaping Use   Vaping Use: Never used  Substance and Sexual Activity   Alcohol use: Yes   Drug use: Not Currently   Sexual activity: Not Currently  Other Topics Concern   Not on file  Social History Narrative   Not on file   Social Determinants of Health   Financial Resource Strain: Not on file  Food Insecurity: Not on file  Transportation Needs: Not on file   Physical Activity: Not on file  Stress: Not on file  Social Connections: Not on file   Additional Social History:                         Sleep: Fair  Appetite:  Fair  Current Medications: Current Facility-Administered Medications  Medication Dose Route Frequency Provider Last Rate Last Admin   acetaminophen (TYLENOL) tablet 650 mg  650 mg Oral Q6H PRN [name], MD   650 mg at 03/25/21 0800   alum & mag hydroxide-simeth (MAALOX/MYLANTA) 200-200-20 MG/5ML suspension 30 mL  30 mL Oral Q4H PRN [name], MD       amLODipine (NORVASC) tablet 5 mg  5 mg Oral Daily [name], MD   5 mg at 03/25/21 0759   benztropine (COGENTIN) tablet 1 mg  1 mg Oral BID [name], MD   1 mg at 03/25/21 0759   busPIRone (BUSPAR) tablet 15 mg  15 mg Oral BID [name], Jamison Y, NP   15 mg at 03/25/21 0759   diphenhydrAMINE (BENADRYL) capsule 100 mg  100 mg Oral Q6H PRN [name], [name], DO   100 mg at 03/24/21 2127   Or   diphenhydrAMINE (BENADRYL) injection 100 mg  100 mg Intramuscular Q6H PRN [name], [name], DO  folic acid (FOLVITE) tablet 1 mg  1 mg Oral Daily [name], MD   1 mg at 03/25/21 0800   haloperidol (HALDOL) tablet 10 mg  10 mg Oral Q6H PRN [name], DO   10 mg at 03/25/21 [id number]   Or   haloperidol lactate (HALDOL) injection 10 mg  10 mg Intramuscular Q6H PRN [name], DO       hydrOXYzine (ATARAX) tablet 25 mg  25 mg Oral TID PRN [name], [name], MD   25 mg at 03/24/21 [id number]   lithium carbonate (LITHOBID) CR tablet 600 mg  600 mg Oral QHS [name], MD   600 mg at 03/24/21 2127   LORazepam (ATIVAN) tablet 2 mg  2 mg Oral Q6H PRN [name], DO   2 mg at 03/24/21 1039   Or   LORazepam (ATIVAN) injection 2 mg  2 mg Intramuscular Q6H PRN [name], DO       magnesium hydroxide (MILK OF MAGNESIA) suspension 30 mL  30 mL Oral Daily PRN Makinzy Cleere, [name], MD       QUEtiapine  (SEROQUEL) tablet 400 mg  400 mg Oral QHS [name], MD       risperiDONE (RISPERDAL M-TABS) disintegrating tablet 2 mg  2 mg Oral Q8H PRN [name], [name], MD   2 mg at 03/24/21 1809   thiamine tablet 100 mg  100 mg Oral Daily [name], [name], MD   100 mg at 03/25/21 [id number]    Lab Results: No results found for this or any previous visit (from the past 48 hour(s)).  Blood Alcohol level:  Lab Results  Component Value Date   ETH <10 03/18/2021   ETH <10 0000000    Metabolic Disorder Labs: Lab Results  Component Value Date   HGBA1C 4.8 03/21/2021   MPG 91 03/21/2021   MPG 91.06 12/03/2019   Lab Results  Component Value Date   PROLACTIN 25.4 (H) 12/04/2019   Lab Results  Component Value Date   CHOL 174 03/21/2021   TRIG 95 03/21/2021   HDL 39 (L) 03/21/2021   CHOLHDL 4.5 03/21/2021   VLDL 19 03/21/2021   LDLCALC 116 (H) 03/21/2021   LDLCALC 62 12/03/2019    Physical Findings: AIMS:  , ,  ,  ,    CIWA:    COWS:     Musculoskeletal: Strength & Muscle Tone: within normal limits Gait & Station: normal Patient leans: N/A  Psychiatric Specialty Exam:  Presentation  General Appearance: Bizarre  Eye Contact:Minimal  Speech:Clear and Coherent  Speech Volume:Other (comment)  Handedness:Right   Mood and Affect  Mood:Anxious; Dysphoric; Irritable  Affect:Flat; Blunt; Congruent   Thought Process  Thought Processes:Disorganized  Descriptions of Associations:Loose  Orientation:Full (Time, Place and Person)  Thought Content:Delusions; Paranoid Ideation; Scattered  History of Schizophrenia/Schizoaffective disorder:Yes  Duration of Psychotic Symptoms:Greater than six months  Hallucinations:No data recorded Ideas of Reference:Delusions; Paranoia  Suicidal Thoughts:No data recorded Homicidal Thoughts:No data recorded  Sensorium  Memory:Immediate Fair; Recent Fair; Remote Fair  Judgment:Poor  Insight:Poor   Executive Functions   Concentration:Fair  Attention Span:Poor  [place]   Psychomotor Activity  Psychomotor Activity:No data recorded  Assets  Assets:Communication Skills; Desire for Improvement   Sleep  Sleep:No data recorded   Physical Exam: Physical Exam Vitals and nursing note reviewed.  Constitutional:      Appearance: Normal appearance.  HENT:     Head: Normocephalic  and atraumatic.     Mouth/Throat:     Pharynx: Oropharynx is clear.  Eyes:     Pupils: Pupils are equal, round, and reactive to light.  Cardiovascular:     Rate and Rhythm: Normal rate and regular rhythm.  Pulmonary:     Effort: Pulmonary effort is normal.     Breath sounds: Normal breath sounds.  Abdominal:     General: Abdomen is flat.     Palpations: Abdomen is soft.  Musculoskeletal:        General: Normal range of motion.  Skin:    General: Skin is warm and dry.  Neurological:     General: No focal deficit present.     Mental Status: He is alert. Mental status is at baseline.  Psychiatric:        Attention and Perception: He is inattentive.        Mood and Affect: Mood normal. Affect is blunt.        Speech: Speech is delayed.        Behavior: Behavior is slowed.        Thought Content: Thought content is paranoid.   Review of Systems  Constitutional: Negative.   HENT: Negative.    Eyes: Negative.   Respiratory: Negative.    Cardiovascular: Negative.   Gastrointestinal: Negative.   Musculoskeletal: Negative.   Skin: Negative.   Neurological: Negative.   Psychiatric/Behavioral: Negative.    Blood pressure (!) 140/92, pulse (!) 103, temperature 97.7 F (36.5 C), temperature source Oral, resp. rate 18, height 6' (1.829 m), weight 81.6 kg, SpO2 98 %. Body mass index is 24.41 kg/m.   Treatment Plan Summary: Medication management and Plan continue medication.  Increase dose of Seroquel to 400 mg at night.  Updated mother on plan.  Encourage group  attendance.  No new labs ordered.  Likely discharge not to let the staff for the weekend.  Alethia Berthold, MD 03/25/2021, 2:59 PM

## 2021-03-26 DIAGNOSIS — F203 Undifferentiated schizophrenia: Secondary | ICD-10-CM | POA: Diagnosis not present

## 2021-03-26 NOTE — Progress Notes (Signed)
D: Pt denies SI/HI/AVH. Rates anxiety-8/10 and depression 1/10. Pain- L foot knee Rates _8/10. Takes tylenol for pain.  Reports  having a dream about running from something. When asked is he afraid of something. He states, "too young to know why."  A: Pt provided support and encouragement. Paces and responds to internal stimuli. Pt given medication per protocol and standing orders. Q73m safety checks implemented and continued.   R: Pt safe on the unit. Will continue to monitor.

## 2021-03-26 NOTE — [facility] Suicide Risk Assessment (Signed)
[facility] INPATIENT:  Family/Significant Other Suicide Prevention Education  Suicide Prevention Education:  Education Completed; Consuela Mimes, mother, [phone]  has been identified by the patient as the family member/significant other with whom the patient will be residing, and identified as the person(s) who will aid the patient in the event of a mental health crisis (suicidal ideations/suicide attempt).  With written consent from the patient, the family member/significant other has been provided the following suicide prevention education, prior to the and/or following the discharge of the patient.  The suicide prevention education provided includes the following: Suicide risk factors Suicide prevention and interventions National Suicide Hotline telephone number [facility] assessment telephone number [facility] Emergency Assistance 911 [facility] and/or Residential Mobile Crisis Unit telephone number  Request made of family/significant other to: Remove weapons (e.g., guns, rifles, knives), all items previously/currently identified as safety concern.   Remove drugs/medications (over-the-counter, prescriptions, illicit drugs), all items previously/currently identified as a safety concern.  The family member/significant other verbalizes understanding of the suicide prevention education information provided.  The family member/significant other agrees to remove the items of safety concern listed above.  Mother reports that the patient "was having an imbalance".  She reports that patient does NOT have access to weapons.  She reports that patient is not a danger to others however, she has concerns that patient can be misunderstood and be at risk.  She reports that patient sees Hilbert Bible with [facility].  She reports that she is scheduling the patient's follow up appointment.   Harden Mo 03/26/2021, 3:20 PM

## 2021-03-26 NOTE — Progress Notes (Signed)
[facility] MD Progress Note  03/26/2021 4:41 PM [name]  MRN:  [id number] Subjective: Patient seen.  He continues to pace most of the time.  Otherwise spends a lot of time in his room just staring at the ground.  When we attempt to engage him in conversation does not make much eye contact.  Only question is ever about discharge.  No violence or aggression.  Tolerating medicine Principal Problem: Schizophrenia (HCC) Diagnosis: Principal Problem:   Schizophrenia (HCC)  Total Time spent with patient: 30 minutes  Past Psychiatric History: Past history of schizophrenia several prior hospitalizations  Past Medical History:  Past Medical History:  Diagnosis Date   Mental health problem    History reviewed. No pertinent surgical history. Family History: History reviewed. No pertinent family history. Family Psychiatric  History: See previous Social History:  Social History   Substance and Sexual Activity  Alcohol Use Yes     Social History   Substance and Sexual Activity  Drug Use Not Currently    Social History   Socioeconomic History   Marital status: Single    Spouse name: Not on file   Number of children: Not on file   Years of education: Not on file   Highest education level: Not on file  Occupational History   Not on file  Tobacco Use   Smoking status: Every Day    Packs/day: 2.00    Years: 2.00    Pack years: 4.00    Types: Cigarettes   Smokeless tobacco: Never  Vaping Use   Vaping Use: Never used  Substance and Sexual Activity   Alcohol use: Yes   Drug use: Not Currently   Sexual activity: Not Currently  Other Topics Concern   Not on file  Social History Narrative   Not on file   Social Determinants of Health   Financial Resource Strain: Not on file  Food Insecurity: Not on file  Transportation Needs: Not on file  Physical Activity: Not on file  Stress: Not on file  Social Connections: Not on file   Additional Social History:                          Sleep: Fair  Appetite:  Fair  Current Medications: Current Facility-Administered Medications  Medication Dose Route Frequency Provider Last Rate Last Admin   acetaminophen (TYLENOL) tablet 650 mg  650 mg Oral Q6H PRN Chauntelle Azpeitia T, MD   650 mg at 03/26/21 1406   alum & mag hydroxide-simeth (MAALOX/MYLANTA) 200-200-20 MG/5ML suspension 30 mL  30 mL Oral Q4H PRN Hughey Rittenberry T, MD       amLODipine (NORVASC) tablet 5 mg  5 mg Oral Daily Clois Montavon T, MD   5 mg at 03/26/21 0736   benztropine (COGENTIN) tablet 1 mg  1 mg Oral BID Lavanda Nevels T, MD   1 mg at 03/26/21 1612   busPIRone (BUSPAR) tablet 15 mg  15 mg Oral BID Charm Rings, NP   15 mg at 03/26/21 1613   diphenhydrAMINE (BENADRYL) capsule 100 mg  100 mg Oral Q6H PRN Sarina Ill, DO   100 mg at 03/25/21 2108   Or   diphenhydrAMINE (BENADRYL) injection 100 mg  100 mg Intramuscular Q6H PRN Sarina Ill, DO       folic acid (FOLVITE) tablet 1 mg  1 mg Oral Daily Shayonna Ocampo, Jackquline Denmark, MD   1 mg at 03/26/21 [phone]  haloperidol (HALDOL) tablet 10 mg  10 mg Oral Q6H PRN [name], DO   10 mg at 03/25/21 [id number]   Or   haloperidol lactate (HALDOL) injection 10 mg  10 mg Intramuscular Q6H PRN [name], DO       hydrOXYzine (ATARAX) tablet 25 mg  25 mg Oral TID PRN Camilla Skeen, [name], MD   25 mg at 03/26/21 0736   lithium carbonate (LITHOBID) CR tablet 600 mg  600 mg Oral QHS Makiyah Zentz T, MD   600 mg at 03/25/21 2108   LORazepam (ATIVAN) tablet 2 mg  2 mg Oral Q6H PRN [name], DO   2 mg at 03/24/21 1039   Or   LORazepam (ATIVAN) injection 2 mg  2 mg Intramuscular Q6H PRN [name], DO       magnesium hydroxide (MILK OF MAGNESIA) suspension 30 mL  30 mL Oral Daily PRN Athalene Kolle, [name], MD       QUEtiapine (SEROQUEL) tablet 400 mg  400 mg Oral QHS Eamonn Sermeno T, MD   400 mg at 03/25/21 2108   risperiDONE (RISPERDAL M-TABS) disintegrating  tablet 2 mg  2 mg Oral Q8H PRN Jeryl Wilbourn, [name], MD   2 mg at 03/24/21 1809   thiamine tablet 100 mg  100 mg Oral Daily Nattalie Santiesteban, [name], MD   100 mg at 03/26/21 [id number]    Lab Results: No results found for this or any previous visit (from the past 48 hour(s)).  Blood Alcohol level:  Lab Results  Component Value Date   ETH <10 03/18/2021   ETH <10 0000000    Metabolic Disorder Labs: Lab Results  Component Value Date   HGBA1C 4.8 03/21/2021   MPG 91 03/21/2021   MPG 91.06 12/03/2019   Lab Results  Component Value Date   PROLACTIN 25.4 (H) 12/04/2019   Lab Results  Component Value Date   CHOL 174 03/21/2021   TRIG 95 03/21/2021   HDL 39 (L) 03/21/2021   CHOLHDL 4.5 03/21/2021   VLDL 19 03/21/2021   LDLCALC 116 (H) 03/21/2021   LDLCALC 62 12/03/2019    Physical Findings: AIMS:  , ,  ,  ,    CIWA:    COWS:     Musculoskeletal: Strength & Muscle Tone: within normal limits Gait & Station: normal Patient leans: N/A  Psychiatric Specialty Exam:  Presentation  General Appearance: Bizarre  Eye Contact:Minimal  Speech:Clear and Coherent  Speech Volume:Other (comment)  Handedness:Right   Mood and Affect  Mood:Anxious; Dysphoric; Irritable  Affect:Flat; Blunt; Congruent   Thought Process  Thought Processes:Disorganized  Descriptions of Associations:Loose  Orientation:Full (Time, Place and Person)  Thought Content:Delusions; Paranoid Ideation; Scattered  History of Schizophrenia/Schizoaffective disorder:Yes  Duration of Psychotic Symptoms:Greater than six months  Hallucinations:No data recorded Ideas of Reference:Delusions; Paranoia  Suicidal Thoughts:No data recorded Homicidal Thoughts:No data recorded  Sensorium  Memory:Immediate Fair; Recent Fair; Remote Fair  Judgment:Poor  Insight:Poor   Executive Functions  Concentration:Fair  Attention Span:Poor  Nathan Barnett   Psychomotor Activity   Psychomotor Activity:No data recorded  Assets  Assets:Communication Skills; Desire for Improvement   Sleep  Sleep:No data recorded   Physical Exam: Physical Exam Vitals and nursing note reviewed.  Constitutional:      Appearance: Normal appearance.  HENT:     Head: Normocephalic and atraumatic.     Mouth/Throat:     Pharynx: Oropharynx is clear.  Eyes:  Pupils: Pupils are equal, round, and reactive to light.  Cardiovascular:     Rate and Rhythm: Normal rate and regular rhythm.  Pulmonary:     Effort: Pulmonary effort is normal.     Breath sounds: Normal breath sounds.  Abdominal:     General: Abdomen is flat.     Palpations: Abdomen is soft.  Musculoskeletal:        General: Normal range of motion.  Skin:    General: Skin is warm and dry.  Neurological:     General: No focal deficit present.     Mental Status: He is alert. Mental status is at baseline.  Psychiatric:        Attention and Perception: He is inattentive.        Mood and Affect: Mood normal. Affect is blunt.        Speech: Speech is delayed.        Thought Content: Thought content is paranoid.   Review of Systems  Constitutional: Negative.   HENT: Negative.    Eyes: Negative.   Respiratory: Negative.    Cardiovascular: Negative.   Gastrointestinal: Negative.   Musculoskeletal: Negative.   Skin: Negative.   Neurological: Negative.   Psychiatric/Behavioral: Negative.    Blood pressure 127/86, pulse 90, temperature 97.8 F (36.6 C), temperature source Oral, resp. rate 18, height 6' (1.829 m), weight 81.6 kg, SpO2 98 %. Body mass index is 24.41 kg/m.   Treatment Plan Summary: Medication management and Plan we are up to 400 mg of Seroquel at night which is a reasonable therapeutic dose for psychosis so I am not going to increase it at the moment.  Keep trying to form some kind of rapport or make conversation with the patient.  Continue engagement in groups on the unit.  Alethia Berthold,  MD 03/26/2021, 4:41 PM

## 2021-03-26 NOTE — Progress Notes (Signed)
Patient continues to pace the entire shift. Patient noted to have blisters noted to metatarsopharyngeal fat pad of bilateral feet. Patient given band aids to cover feet and encouraged to rest.  Patient also offered shoes by Recreational Therapist and staff  RN's x2.    Patient stated, "No I'm good." Cont Q15 minute check for safety.

## 2021-03-26 NOTE — [facility] Suicide Risk Assessment (Signed)
[facility] INPATIENT:  Family/Significant Other Suicide Prevention Education  Suicide Prevention Education:  Contact Attempts: Consuela Mimes, mother, [phone] has been identified by the patient as the family member/significant other with whom the patient will be residing, and identified as the person(s) who will aid the patient in the event of a mental health crisis.  With written consent from the patient, two attempts were made to provide suicide prevention education, prior to and/or following the patient's discharge.  We were unsuccessful in providing suicide prevention education.  A suicide education pamphlet was given to the patient to share with family/significant other.  Date and time of first attempt:03/25/2021 4:31PM Date and time of second attempt:03/26/2021 2:55PM  CSW left HIPAA compliant voicemail.  Harden Mo 03/26/2021, 2:55 PM

## 2021-03-26 NOTE — Progress Notes (Signed)
Recreation Therapy Notes  Date: 03/26/2021  Time: 10:40 am   Location: Craft room      Behavioral response: N/A   Intervention Topic: Self-care  Discussion/Intervention: Patient refused to attend group.   Clinical Observations/Feedback:  Patient refused to attend group.    Nzinga Ferran LRT/CTRS        Mccoy Testa 03/26/2021 12:48 PM

## 2021-03-26 NOTE — Plan of Care (Signed)
°  Problem: Education: °Goal: Knowledge of [facility] Education information/materials will improve °Outcome: Not Progressing °Goal: Emotional status will improve °Outcome: Not Progressing °Goal: Mental status will improve °Outcome: Not Progressing °Goal: Verbalization of understanding the information provided will improve °Outcome: Not Progressing °  °Problem: Education: °Goal: Ability to state activities that reduce stress will improve °Outcome: Not Progressing °  °Problem: Coping: °Goal: Ability to identify and develop effective coping behavior will improve °Outcome: Not Progressing °  °Problem: Activity: °Goal: Will verbalize the importance of balancing activity with adequate rest periods °Outcome: Not Progressing °  °Problem: Education: °Goal: Will be free of psychotic symptoms °Outcome: Not Progressing °Goal: Knowledge of the prescribed therapeutic regimen will improve °Outcome: Not Progressing °  °

## 2021-03-26 NOTE — Group Note (Signed)
[facility] LCSW Group Therapy Note ° ° °Group Date: 03/26/2021 °Start Time: 1300 °End Time: 1400 ° °Type of Therapy and Topic:  Group Therapy:  Feelings around Relapse and Recovery ° °Participation Level:  Did Not Attend  ° °Mood: ° °Description of Group:   ° Patients in this group will discuss emotions they experience before and after a relapse. They will process how experiencing these feelings, or avoidance of experiencing them, relates to having a relapse. Facilitator will guide patients to explore emotions they have related to recovery. Patients will be encouraged to process which emotions are more powerful. They will be guided to discuss the emotional reaction significant others in their lives may have to patients’ relapse or recovery. Patients will be assisted in exploring ways to respond to the emotions of others without this contributing to a relapse. ° °Therapeutic Goals: °Patient will identify two or more emotions that lead to relapse for them:  °Patient will identify two emotions that result when they relapse:  °Patient will identify two emotions related to recovery:  °Patient will demonstrate ability to communicate their needs through discussion and/or role plays. ° ° °Summary of Patient Progress: ° °Patient did not attend group despite encouraged participation.  ° ° °Therapeutic Modalities:   °Cognitive Behavioral Therapy °Solution-Focused Therapy °Assertiveness Training °Relapse Prevention Therapy ° ° °Anisha Starliper W Faizon Capozzi, LCSWA °

## 2021-03-27 DIAGNOSIS — F203 Undifferentiated schizophrenia: Secondary | ICD-10-CM | POA: Diagnosis not present

## 2021-03-27 NOTE — Plan of Care (Signed)
°  Problem: Education: Goal: Knowledge of [facility] Education information/materials will improve Outcome: Not Progressing Goal: Emotional status will improve Outcome: Not Progressing Goal: Mental status will improve Outcome: Not Progressing Goal: Verbalization of understanding the information provided will improve Outcome: Not Progressing   Problem: Education: Goal: Ability to state activities that reduce stress will improve Outcome: Not Progressing   Problem: Coping: Goal: Ability to identify and develop effective coping behavior will improve Outcome: Not Progressing   Problem: Activity: Goal: Will verbalize the importance of balancing activity with adequate rest periods Outcome: Not Progressing   Problem: Education: Goal: Will be free of psychotic symptoms Outcome: Not Progressing Goal: Knowledge of the prescribed therapeutic regimen will improve Outcome: Not Progressing

## 2021-03-27 NOTE — Progress Notes (Signed)
Patient awake and alert this this am. Pacing throughout the day but not as frequently.   Denies SI/HI/ AVH, anxiety and depression. Reports pain. See pain assessment for details.   Takes medications as prescribed.  Seen responding to internal stimuli. Appears to be more agitated with a blunted affect this shift. PRN medications given for agitation. Patient tolerated well.   No adverse reactions to medications noted. Cont Q15 minute check for safety.

## 2021-03-27 NOTE — Progress Notes (Signed)
Patient alert and oriented x 4, affect is blunted he appears irritable, noted pacing the unit, when approached the writer forward very little information, did not want to engage in conversation. Patient denies SI/HI/AVH. Patient is complaint with medication no distress noted, 15 minutes safety checks maintained will continue to monitor.

## 2021-03-27 NOTE — Progress Notes (Addendum)
[facility] MD Progress Note  03/27/2021 8:49 AM Nathan Barnett.  MRN:  [id number] Subjective:  Patient was initially seen for psychiatric evaluation on March 21, 2021 due to agressive behaviors.   Patient likes to pace the halls.  He was observed doing that this morning.  Says that he is fine today.  Denies any stress or challenges today.  No conflicts on the unit.  He is eating and sleeping well.  He is taking his medications.  Says that he came in because "my mom wanted my levels balance."  Tells me that he might be leaving next week.  Denies depression anxiety.  Denies symptoms of psychosis.  She has with me today that he believes he has "temporal aphasia" because he forgets things "10 times a day." "I probably should have told you that, now might be here for longer." According to the nurse, he is more conversation for the past day or so.  He is taking his medications without resistance. No aggression. Principal Problem: Schizophrenia ([place]) Diagnosis: Principal Problem:   Schizophrenia ([place])  Total Time spent with patient: 33 minutes  Past Psychiatric History: Past history of schizophrenia several prior hospitalizations  Past Medical History:  Past Medical History:  Diagnosis Date   Mental health problem    History reviewed. No pertinent surgical history. Family History: History reviewed. No pertinent family history. Family Psychiatric  History: See previous Social History:  Social History   Substance and Sexual Activity  Alcohol Use Yes     Social History   Substance and Sexual Activity  Drug Use Not Currently    Social History   Socioeconomic History   Marital status: Single    Spouse name: Not on file   Number of children: Not on file   Years of education: Not on file   Highest education level: Not on file  Occupational History   Not on file  Tobacco Use   Smoking status: Every Day    Packs/day: 2.00    Years: 2.00    Pack years: 4.00    Types: Cigarettes    Smokeless tobacco: Never  Vaping Use   Vaping Use: Never used  Substance and Sexual Activity   Alcohol use: Yes   Drug use: Not Currently   Sexual activity: Not Currently  Other Topics Concern   Not on file  Social History Narrative   Not on file   Social Determinants of Health   Financial Resource Strain: Not on file  Food Insecurity: Not on file  Transportation Needs: Not on file  Physical Activity: Not on file  Stress: Not on file  Social Connections: Not on file   Additional Social History:                         Sleep: Fair  Appetite:  Fair  Current Medications: Current Facility-Administered Medications  Medication Dose Route Frequency Provider Last Rate Last Admin   acetaminophen (TYLENOL) tablet 650 mg  650 mg Oral Q6H PRN Clapacs, John T, MD   650 mg at 03/27/21 0646   alum & mag hydroxide-simeth (MAALOX/MYLANTA) 200-200-20 MG/5ML suspension 30 mL  30 mL Oral Q4H PRN Clapacs, John T, MD       amLODipine (NORVASC) tablet 5 mg  5 mg Oral Daily Clapacs, John T, MD   5 mg at 03/27/21 0840   benztropine (COGENTIN) tablet 1 mg  1 mg Oral BID Clapacs, [name], MD   1 mg at  03/27/21 0840   busPIRone (BUSPAR) tablet 15 mg  15 mg Oral BID Patrecia Pour, NP   15 mg at 03/27/21 0840   diphenhydrAMINE (BENADRYL) capsule 100 mg  100 mg Oral Q6H PRN [name], DO   100 mg at 03/25/21 2108   Or   diphenhydrAMINE (BENADRYL) injection 100 mg  100 mg Intramuscular Q6H PRN [name], DO       folic acid (FOLVITE) tablet 1 mg  1 mg Oral Daily Clapacs, John T, MD   1 mg at 03/27/21 0840   haloperidol (HALDOL) tablet 10 mg  10 mg Oral Q6H PRN [name], DO   10 mg at 03/27/21 [id number]   Or   haloperidol lactate (HALDOL) injection 10 mg  10 mg Intramuscular Q6H PRN [name], DO       hydrOXYzine (ATARAX) tablet 25 mg  25 mg Oral TID PRN Clapacs, [name], MD   25 mg at 03/27/21 0841   lithium carbonate (LITHOBID) CR tablet 600  mg  600 mg Oral QHS Clapacs, John T, MD   600 mg at 03/26/21 2115   LORazepam (ATIVAN) tablet 2 mg  2 mg Oral Q6H PRN [name], DO   2 mg at 03/24/21 1039   Or   LORazepam (ATIVAN) injection 2 mg  2 mg Intramuscular Q6H PRN [name], DO       magnesium hydroxide (MILK OF MAGNESIA) suspension 30 mL  30 mL Oral Daily PRN Clapacs, [name], MD       QUEtiapine (SEROQUEL) tablet 400 mg  400 mg Oral QHS Clapacs, John T, MD   400 mg at 03/26/21 2115   risperiDONE (RISPERDAL M-TABS) disintegrating tablet 2 mg  2 mg Oral Q8H PRN Clapacs, [name], MD   2 mg at 03/24/21 1809   thiamine tablet 100 mg  100 mg Oral Daily Clapacs, [name], MD   100 mg at 03/27/21 0840    Lab Results: No results found for this or any previous visit (from the past 51 hour(s)).  Blood Alcohol level:  Lab Results  Component Value Date   ETH <10 03/18/2021   ETH <10 0000000    Metabolic Disorder Labs: Lab Results  Component Value Date   HGBA1C 4.8 03/21/2021   MPG 91 03/21/2021   MPG 91.06 12/03/2019   Lab Results  Component Value Date   PROLACTIN 25.4 (H) 12/04/2019   Lab Results  Component Value Date   CHOL 174 03/21/2021   TRIG 95 03/21/2021   HDL 39 (L) 03/21/2021   CHOLHDL 4.5 03/21/2021   VLDL 19 03/21/2021   LDLCALC 116 (H) 03/21/2021   LDLCALC 62 12/03/2019    Physical Findings: AIMS:  , ,  ,  ,    CIWA:    COWS:     Musculoskeletal: Strength & Muscle Tone: within normal limits Gait & Station: normal Patient leans: N/A  Psychiatric Specialty Exam:  Presentation  General Appearance: Bizarre  Eye Contact:Minimal  Speech:Clear and Coherent  Speech Volume:Other (comment)  Handedness:Right   Mood and Affect  Mood:fine Affect:neutral  Thought Process  Thought Processes:Disorganized  Descriptions of Associations:Loose  Orientation:Full (Time, Place and Person)  Thought Content:Delusions; Paranoid Ideation; Scattered  History of  Schizophrenia/Schizoaffective disorder:Yes  Duration of Psychotic Symptoms:Greater than six months  Hallucinations:No data recorded Ideas of Reference:Delusions; Paranoia  Suicidal Thoughts:No data recorded Homicidal Thoughts:No data recorded  Sensorium  Memory:Immediate Fair; Recent Fair; Remote Fair  Judgment:Poor  Insight:Poor   Executive Functions  Concentration:Fair  Attention Span:Poor  [place]   Psychomotor Activity  Psychomotor Activity:No data recorded  Assets  Assets:Communication Skills; Desire for Improvement   Sleep  Sleep:No data recorded   Review of Systems  Constitutional: Negative.   HENT: Negative.    Eyes: Negative.   Respiratory: Negative.    Cardiovascular: Negative.   Gastrointestinal: Negative.   Musculoskeletal: Negative.   Skin: Negative.   Neurological: Negative.   Psychiatric/Behavioral: Negative.    Blood pressure 119/80, pulse 85, temperature 97.9 F (36.6 C), temperature source Oral, resp. rate 18, height 6' (1.829 m), weight 81.6 kg, SpO2 100 %. Body mass index is 24.41 kg/m.   Treatment Plan Summary: Medication management and Plan we are up to 400 mg of Seroquel at night which is a reasonable therapeutic dose for psychosis so I am not going to increase it at the moment.  Keep trying to form some kind of rapport or make conversation with the patient.  Continue engagement in groups on the unit.  1/28 No changes    Rulon Sera, MD 03/27/2021, 8:49 AM

## 2021-03-28 DIAGNOSIS — F203 Undifferentiated schizophrenia: Secondary | ICD-10-CM | POA: Diagnosis not present

## 2021-03-28 NOTE — BH IP Treatment Plan (Signed)
Interdisciplinary Treatment and Diagnostic Plan Update  03/28/2021 Time of Session: 10:30AM Marylene Land [facility]. MRN: [id number]  Principal Diagnosis: Schizophrenia ([place])  Secondary Diagnoses: Principal Problem:   Schizophrenia ([place])   Current Medications:  Current Facility-Administered Medications  Medication Dose Route Frequency Provider Last Rate Last Admin   acetaminophen (TYLENOL) tablet 650 mg  650 mg Oral Q6H PRN Clapacs, John T, MD   650 mg at 03/28/21 0801   alum & mag hydroxide-simeth (MAALOX/MYLANTA) 200-200-20 MG/5ML suspension 30 mL  30 mL Oral Q4H PRN Clapacs, John T, MD       amLODipine (NORVASC) tablet 5 mg  5 mg Oral Daily Clapacs, John T, MD   5 mg at 03/28/21 0800   benztropine (COGENTIN) tablet 1 mg  1 mg Oral BID Clapacs, John T, MD   1 mg at 03/28/21 0800   busPIRone (BUSPAR) tablet 15 mg  15 mg Oral BID Patrecia Pour, NP   15 mg at 03/28/21 0800   diphenhydrAMINE (BENADRYL) capsule 100 mg  100 mg Oral Q6H PRN [name], DO   100 mg at 03/27/21 2102   Or   diphenhydrAMINE (BENADRYL) injection 100 mg  100 mg Intramuscular Q6H PRN [name], DO       folic acid (FOLVITE) tablet 1 mg  1 mg Oral Daily Clapacs, John T, MD   1 mg at 03/28/21 0800   haloperidol (HALDOL) tablet 10 mg  10 mg Oral Q6H PRN [name], DO   10 mg at 03/27/21 7628   Or   haloperidol lactate (HALDOL) injection 10 mg  10 mg Intramuscular Q6H PRN [name], DO       hydrOXYzine (ATARAX) tablet 25 mg  25 mg Oral TID PRN Clapacs, [name], MD   25 mg at 03/27/21 0841   lithium carbonate (LITHOBID) CR tablet 600 mg  600 mg Oral QHS Clapacs, John T, MD   600 mg at 03/27/21 2103   LORazepam (ATIVAN) tablet 2 mg  2 mg Oral Q6H PRN [name], DO   2 mg at 03/27/21 2103   Or   LORazepam (ATIVAN) injection 2 mg  2 mg Intramuscular Q6H PRN [name], DO       magnesium hydroxide (MILK OF MAGNESIA) suspension 30 mL  30 mL  Oral Daily PRN Clapacs, John T, MD       QUEtiapine (SEROQUEL) tablet 400 mg  400 mg Oral QHS Clapacs, John T, MD   400 mg at 03/27/21 2103   risperiDONE (RISPERDAL M-TABS) disintegrating tablet 2 mg  2 mg Oral Q8H PRN Clapacs, [name], MD   2 mg at 03/24/21 1809   thiamine tablet 100 mg  100 mg Oral Daily Clapacs, John T, MD   100 mg at 03/28/21 0800   PTA Medications: Medications Prior to Admission  Medication Sig Dispense Refill Last Dose   ARIPiprazole (ABILIFY) 10 MG tablet Take 10 mg by mouth daily. Pt takes with 2 mg to equal 12 mg for a total dose (Patient not taking: Reported on 03/19/2021)      ARIPiprazole (ABILIFY) 2 MG tablet Take 2 mg by mouth daily. Pt takes with 10 mg for a total dose of 12 mg. (Patient not taking: Reported on 03/19/2021)      benztropine (COGENTIN) 1 MG tablet Take 1 mg by mouth 2 (two) times daily.      busPIRone (BUSPAR) 15 MG tablet Take 15 mg by mouth 2 (two)  times daily.      folic acid (FOLVITE) 1 MG tablet Take 1 tablet (1 mg total) by mouth daily. (Patient not taking: Reported on 03/19/2021) 30 tablet 0    hydrOXYzine (ATARAX/VISTARIL) 25 MG tablet Take 1 tablet (25 mg total) by mouth 3 (three) times daily as needed for anxiety. 30 tablet 0    hydrOXYzine (VISTARIL) 25 MG capsule Take 25 mg by mouth 3 (three) times daily as needed.      lithium carbonate (ESKALITH) 450 MG CR tablet Take 1 tablet (450 mg total) by mouth at bedtime. (Patient not taking: Reported on 03/19/2021) 30 tablet 0    lithium carbonate (LITHOBID) 300 MG CR tablet Take 600 mg by mouth at bedtime.      mirtazapine (REMERON) 15 MG tablet Take 15 mg by mouth at bedtime.      thiamine 100 MG tablet Take 1 tablet (100 mg total) by mouth daily. (Patient not taking: Reported on 03/19/2021) 30 tablet 0    traZODone (DESYREL) 50 MG tablet Take 1 tablet (50 mg total) by mouth at bedtime as needed and may repeat dose one time if needed for sleep. (Patient not taking: Reported on 10/03/2020) 30 tablet 0     ziprasidone (GEODON) 20 MG capsule Take 20 mg by mouth 2 (two) times daily with a meal. Take 4m by mouth in the morning, and 478mby mouth in the evening. 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   Physician Treatment Plan for Primary Diagnosis: [place]izophrenia ([place] Improvement in symptoms so as ready for discharge   Short Term Goals: Ability to identify changes in lifestyle to reduce recurrence of condition will improve Ability to verbalize feelings will improve Ability to disclose and discuss suicidal ideas Ability to demonstrate self-control will improve Ability to identify and develop effective coping behaviors will improve Ability to maintain clinical measurements within normal limits will improve Compliance with prescribed medications will improve Ability to identify triggers associated with substance abuse/mental health issues will improve  Medication Management: Evaluate patient's response, side effects, and tolerance of medication regimen.  Therapeutic Interventions: 1 to 1 sessions, Unit Group sessions and Medication administration.  Evaluation of Outcomes: Not Met  Physician Treatment Plan for Secondary Diagnosis: Principal Problem:   Schizophrenia ([place] Goal(s): Improvement in symptoms so as ready for discharge   Short Term Goals: Ability to identify changes in lifestyle to reduce recurrence of condition will improve Ability to verbalize feelings will improve Ability to disclose and discuss suicidal ideas Ability to demonstrate self-control will improve Ability to identify and develop effective coping behaviors will improve Ability to  maintain clinical measurements within normal limits will improve Compliance with prescribed medications will improve Ability to identify triggers associated with substance abuse/mental health issues will improve     Medication Management: Evaluate patient's response, side effects, and tolerance of medication regimen.  Therapeutic Interventions: 1 to 1 sessions, Unit Group sessions and Medication administration.  Evaluation of Outcomes: Not Met   RN Treatment Plan for Primary Diagnosis: Schizophrenia ([place] Goal(s): Knowledge of disease and therapeutic regimen to maintain health will improve  Short Term Goals: Ability to demonstrate self-control, Ability to participate in decision making will improve,  Ability to verbalize feelings will improve, Ability to disclose and discuss suicidal ideas, Ability to identify and develop effective coping behaviors will improve, and Compliance with prescribed medications will improve  Medication Management: RN will administer medications as ordered by provider, will assess and evaluate patient's response and provide education to patient for prescribed medication. RN will report any adverse and/or side effects to prescribing provider.  Therapeutic Interventions: 1 on 1 counseling sessions, Psychoeducation, Medication administration, Evaluate responses to treatment, Monitor vital signs and CBGs as ordered, Perform/monitor CIWA, COWS, AIMS and Fall Risk screenings as ordered, Perform wound care treatments as ordered.  Evaluation of Outcomes: Progressing   LCSW Treatment Plan for Primary Diagnosis: Schizophrenia ([place]) Long Term Goal(s): Safe transition to appropriate next level of care at discharge, Engage patient in therapeutic group addressing interpersonal concerns.  Short Term Goals: Engage patient in aftercare planning with referrals and resources, Increase social support, Increase ability to appropriately verbalize feelings, Increase emotional  regulation, Facilitate acceptance of mental health diagnosis and concerns, and Increase skills for wellness and recovery  Therapeutic Interventions: Assess for all discharge needs, 1 to 1 time with Social worker, Explore available resources and support systems, Assess for adequacy in community support network, Educate family and significant other(s) on suicide prevention, Complete Psychosocial Assessment, Interpersonal group therapy.  Evaluation of Outcomes: Not Met   Progress in Treatment: Attending groups: No. Participating in groups: No. Taking medication as prescribed: Yes. Toleration medication: Yes. Family/Significant other contact made: Yes, individual(s) contacted:  SPE completed with patient's mother. Patient understands diagnosis: Yes. Discussing patient identified problems/goals with staff: Yes. Medical problems stabilized or resolved: Yes. Denies suicidal/homicidal ideation: Yes. Issues/concerns per patient self-inventory: No. Other: None.  New problem(s) identified: No, Describe:  None.  New Short Term/Long Term Goal(s): elimination of symptoms of psychosis, medication management for mood stabilization; elimination of SI thoughts; development of comprehensive mental wellness/sobriety plan. Update 03/28/2021: No changes at this time.   Patient Goals: "getting my medicine" Update 03/28/2021: No changes at this time.   Discharge Plan or Barriers: CSW will assist patient in developing appropriate discharge plans.  Update 03/28/2021: No changes at this time.   Reason for Continuation of Hospitalization: Anxiety Depression Medication stabilization  Estimated Length of Stay: TBD   Scribe for Treatment Team: Sherilyn Dacosta 03/28/2021 10:53 AM

## 2021-03-28 NOTE — Progress Notes (Signed)
No distress noted, still appears responding to internal stimuli although denies SI/HI/AVH, complaint with medication regimen 15 minutes safety checks maintained will continue to monitor.

## 2021-03-28 NOTE — Progress Notes (Signed)
Patient present on the unit pacing most of the day.     Denies SI/HI/ AVH. Denies anxiety and depression. Denies pain but request tylenol. States, " to keep the  pain at bay". See pain assessment for details.    Takes medications as prescribed and request prn medications. See separate note for details.  Seen responding to internal stimuli. Appears to be more agitated with a blunted affect this shift.   Labs and vital monitored. Patient supported emotionally and encouraged to verbalize concerns.    No adverse reactions to medications noted. Cont Q15 minute check for safety

## 2021-03-28 NOTE — Plan of Care (Signed)
°  Problem: Education: Goal: Knowledge of [place] [facility] Education information/materials will improve Outcome: Not Progressing Goal: Emotional status will improve Outcome: Not Progressing Goal: Mental status will improve Outcome: Not Progressing Goal: Verbalization of understanding the information provided will improve Outcome: Not Progressing   Problem: Education: Goal: Ability to state activities that reduce stress will improve Outcome: Not Progressing   Problem: Coping: Goal: Ability to identify and develop effective coping behavior will improve Outcome: Not Progressing   Problem: Coping: Goal: Ability to identify and develop effective coping behavior will improve Outcome: Not Progressing   Problem: Activity: Goal: Will verbalize the importance of balancing activity with adequate rest periods Outcome: Not Progressing   Problem: Education: Goal: Will be free of psychotic symptoms Outcome: Not Progressing Goal: Knowledge of the prescribed therapeutic regimen will improve Outcome: Not Progressing

## 2021-03-28 NOTE — Progress Notes (Signed)
[facility] MD Progress Note  03/28/2021 6:55 AM Nathan Barnett.  MRN:  [id number] Subjective:  Patient was initially seen for psychiatric evaluation on March 21, 2021 due to agressive behaviors.  1/29 Patient is seen from in the halls early this morning, prior to shift change.  Tells me that he slept well.  No significant events over the past day.  When asked if he is responding to internal stimuli patient responds "I think out loud... I am not an unapproachable person."  Denies depression or anxiety.  Appetite is intact.  Pt took PRN ativan 2mg  at 2103 last night. "Can you discharge me?"  1/28 Patient likes to pace the halls.  He was observed doing that this morning.  Says that he is fine today.  Denies any stress or challenges today.  No conflicts on the unit.  He is eating and sleeping well.  He is taking his medications.  Says that he came in because "my mom wanted my levels balance."  Tells me that he might be leaving next week.  Denies depression anxiety.  Denies symptoms of psychosis.  She has with me today that he believes he has "temporal aphasia" because he forgets things "10 times a day." "I probably should have told you that, now might be here for longer." According to the nurse, he is more conversation for the past day or so.  He is taking his medications without resistance. No aggression. Principal Problem: Schizophrenia ([place]) Diagnosis: Principal Problem:   Schizophrenia ([place])  Total Time spent with patient: 25 minutes  Past Psychiatric History: Past history of schizophrenia several prior hospitalizations  Past Medical History:  Past Medical History:  Diagnosis Date   Mental health problem    History reviewed. No pertinent surgical history. Family History: History reviewed. No pertinent family history. Family Psychiatric  History: See previous Social History:  Social History   Substance and Sexual Activity  Alcohol Use Yes     Social History   Substance and Sexual  Activity  Drug Use Not Currently    Social History   Socioeconomic History   Marital status: Single    Spouse name: Not on file   Number of children: Not on file   Years of education: Not on file   Highest education level: Not on file  Occupational History   Not on file  Tobacco Use   Smoking status: Every Day    Packs/day: 2.00    Years: 2.00    Pack years: 4.00    Types: Cigarettes   Smokeless tobacco: Never  Vaping Use   Vaping Use: Never used  Substance and Sexual Activity   Alcohol use: Yes   Drug use: Not Currently   Sexual activity: Not Currently  Other Topics Concern   Not on file  Social History Narrative   Not on file   Social Determinants of Health   Financial Resource Strain: Not on file  Food Insecurity: Not on file  Transportation Needs: Not on file  Physical Activity: Not on file  Stress: Not on file  Social Connections: Not on file   Additional Social History:                         Sleep: Fair  Appetite:  Fair  Current Medications: Current Facility-Administered Medications  Medication Dose Route Frequency Provider Last Rate Last Admin   acetaminophen (TYLENOL) tablet 650 mg  650 mg Oral Q6H PRN Clapacs, [name], MD  650 mg at 03/27/21 1720   alum & mag hydroxide-simeth (MAALOX/MYLANTA) 200-200-20 MG/5ML suspension 30 mL  30 mL Oral Q4H PRN Clapacs, John T, MD       amLODipine (NORVASC) tablet 5 mg  5 mg Oral Daily Clapacs, John T, MD   5 mg at 03/27/21 0840   benztropine (COGENTIN) tablet 1 mg  1 mg Oral BID Clapacs, John T, MD   1 mg at 03/27/21 1720   busPIRone (BUSPAR) tablet 15 mg  15 mg Oral BID Patrecia Pour, NP   15 mg at 03/27/21 1720   diphenhydrAMINE (BENADRYL) capsule 100 mg  100 mg Oral Q6H PRN [name], DO   100 mg at 03/27/21 2102   Or   diphenhydrAMINE (BENADRYL) injection 100 mg  100 mg Intramuscular Q6H PRN [name], DO       folic acid (FOLVITE) tablet 1 mg  1 mg Oral Daily Clapacs,  John T, MD   1 mg at 03/27/21 0840   haloperidol (HALDOL) tablet 10 mg  10 mg Oral Q6H PRN [name], DO   10 mg at 03/27/21 [id number]   Or   haloperidol lactate (HALDOL) injection 10 mg  10 mg Intramuscular Q6H PRN [name], DO       hydrOXYzine (ATARAX) tablet 25 mg  25 mg Oral TID PRN Clapacs, [name], MD   25 mg at 03/27/21 0841   lithium carbonate (LITHOBID) CR tablet 600 mg  600 mg Oral QHS Clapacs, John T, MD   600 mg at 03/27/21 2103   LORazepam (ATIVAN) tablet 2 mg  2 mg Oral Q6H PRN [name], DO   2 mg at 03/27/21 2103   Or   LORazepam (ATIVAN) injection 2 mg  2 mg Intramuscular Q6H PRN [name], DO       magnesium hydroxide (MILK OF MAGNESIA) suspension 30 mL  30 mL Oral Daily PRN Clapacs, [name], MD       QUEtiapine (SEROQUEL) tablet 400 mg  400 mg Oral QHS Clapacs, John T, MD   400 mg at 03/27/21 2103   risperiDONE (RISPERDAL M-TABS) disintegrating tablet 2 mg  2 mg Oral Q8H PRN Clapacs, [name], MD   2 mg at 03/24/21 1809   thiamine tablet 100 mg  100 mg Oral Daily Clapacs, [name], MD   100 mg at 03/27/21 0840    Lab Results: No results found for this or any previous visit (from the past 48 hour(s)).  Blood Alcohol level:  Lab Results  Component Value Date   ETH <10 03/18/2021   ETH <10 0000000    Metabolic Disorder Labs: Lab Results  Component Value Date   HGBA1C 4.8 03/21/2021   MPG 91 03/21/2021   MPG 91.06 12/03/2019   Lab Results  Component Value Date   PROLACTIN 25.4 (H) 12/04/2019   Lab Results  Component Value Date   CHOL 174 03/21/2021   TRIG 95 03/21/2021   HDL 39 (L) 03/21/2021   CHOLHDL 4.5 03/21/2021   VLDL 19 03/21/2021   LDLCALC 116 (H) 03/21/2021   LDLCALC 62 12/03/2019    Physical Findings: AIMS:  , ,  ,  ,    CIWA:    COWS:     Musculoskeletal: Strength & Muscle Tone: within normal limits Gait & Station: normal Patient leans: N/A  Psychiatric Specialty Exam:  Presentation   General Appearance: fair grooming Eye Contact:Minimal  Speech:Clear and Coherent  Speech  Volume:Other (comment)  Handedness:Right   Mood and Affect  Mood:okay Affect:flat  Thought Process  Thought Processes:Disorganized  Descriptions of Associations:Loose  Orientation:Full (Time, Place and Person)  Thought Content:Delusions; Paranoid Ideation; Scattered  History of Schizophrenia/Schizoaffective disorder:Yes  Duration of Psychotic Symptoms:Greater than six months  Hallucinations:No data recorded Ideas of Reference:Delusions; Paranoia  Suicidal Thoughts:No data recorded Homicidal Thoughts:No data recorded  Sensorium  Memory:Immediate Fair; Recent Fair; Remote Fair  Judgment:Poor  Insight:Poor   Executive Functions  Concentration:Fair  Attention Span:Poor  [place]   Psychomotor Activity  Psychomotor Activity:No data recorded  Assets  Assets:Communication Skills; Desire for Improvement   Sleep  Sleep:No data recorded   Review of Systems  Constitutional: Negative.   HENT: Negative.    Eyes: Negative.   Respiratory: Negative.    Cardiovascular: Negative.   Gastrointestinal: Negative.   Musculoskeletal: Negative.   Skin: Negative.   Neurological: Negative.   Psychiatric/Behavioral: Negative.    Blood pressure 125/85, pulse 76, temperature 98 F (36.7 C), temperature source Oral, resp. rate 18, height 6' (1.829 m), weight 81.6 kg, SpO2 100 %. Body mass index is 24.41 kg/m.   Treatment Plan Summary: Medication management and Plan we are up to 400 mg of Seroquel at night which is a reasonable therapeutic dose for psychosis so I am not going to increase it at the moment.  Keep trying to form some kind of rapport or make conversation with the patient.  Continue engagement in groups on the unit.  1/28 No changes  1/29 No changes  Rulon Sera, MD 03/28/2021, 6:55 AM

## 2021-03-28 NOTE — Progress Notes (Signed)
Patient increasing noted to be be agitated. Noted to to be responding to internal stimuli. Patient observed yelling out to self and clinching fist. Patient asked nurse, "Is it time for medication." Patient instructed prn medications can be given if he is experiencing symptoms. Patient informs nurse he is feeling extremely anxious and agitated. PRN medication given.   Cont Q15 minute check for safety.

## 2021-03-28 NOTE — Group Note (Signed)
LCSW Group Therapy Note  Group Date: 03/28/2021 Start Time: 1315 End Time: 1400   Type of Therapy and Topic:  Group Therapy - How To Cope with Nervousness about Discharge   Participation Level:  Did Not Attend   Description of Group This process group involved identification of patients' feelings about discharge. Some of them are scheduled to be discharged soon, while others are new admissions, but each of them was asked to share thoughts and feelings surrounding discharge from the hospital. One common theme was that they are excited at the prospect of going home, while another was that many of them are apprehensive about sharing why they were hospitalized. Patients were given the opportunity to discuss these feelings with their peers in preparation for discharge.  Therapeutic Goals  Patient will identify their overall feelings about pending discharge. Patient will think about how they might proactively address issues that they believe will once again arise once they get home (i.e. with parents). Patients will participate in discussion about having hope for change.   Summary of Patient Progress: Patient did not attend group despite encouraged participation.   Therapeutic Modalities Cognitive Behavioral Therapy   Norberto Sorenson, LCSWA 03/28/2021  2:22 PM

## 2021-03-29 DIAGNOSIS — F203 Undifferentiated schizophrenia: Secondary | ICD-10-CM | POA: Diagnosis not present

## 2021-03-29 LAB — LITHIUM LEVEL: Lithium Lvl: 0.6 mmol/L (ref 0.60–1.20)

## 2021-03-29 MED ORDER — LITHIUM CARBONATE ER 450 MG PO TBCR
900.0000 mg | EXTENDED_RELEASE_TABLET | Freq: Every day | ORAL | Status: DC
  Administered 2021-03-29 – 2021-04-01 (×4): 900 mg via ORAL
  Filled 2021-03-29 (×4): qty 2

## 2021-03-29 NOTE — Progress Notes (Signed)
[facility] MD Progress Note  03/29/2021 5:21 PM Nathan Barnett.  MRN:  [id number] Subjective: Patient seen for follow-up.  31 year old man with schizophrenia.  Still paces around the unit a lot.  When he came to speak with me today he was irritable and on insightful.  Showed little understanding of the need for treatment.  Denied suicidal or homicidal ideation but continues to resist sharing anything about his thoughts or discussing with any detailed plans for the future. Principal Problem: Schizophrenia ([facility]) Diagnosis: Principal Problem:   Schizophrenia ([facility])  Total Time spent with patient: 30 minutes  Past Psychiatric History: Past history of schizophrenia  Past Medical History:  Past Medical History:  Diagnosis Date   Mental health problem    History reviewed. No pertinent surgical history. Family History: History reviewed. No pertinent family history. Family Psychiatric  History: See previous Social History:  Social History   Substance and Sexual Activity  Alcohol Use Yes     Social History   Substance and Sexual Activity  Drug Use Not Currently    Social History   Socioeconomic History   Marital status: Single    Spouse name: Not on file   Number of children: Not on file   Years of education: Not on file   Highest education level: Not on file  Occupational History   Not on file  Tobacco Use   Smoking status: Every Day    Packs/day: 2.00    Years: 2.00    Pack years: 4.00    Types: Cigarettes   Smokeless tobacco: Never  Vaping Use   Vaping Use: Never used  Substance and Sexual Activity   Alcohol use: Yes   Drug use: Not Currently   Sexual activity: Not Currently  Other Topics Concern   Not on file  Social History Narrative   Not on file   Social Determinants of Health   Financial Resource Strain: Not on file  Food Insecurity: Not on file  Transportation Needs: Not on file  Physical Activity: Not on file  Stress: Not on file  Social Connections:  Not on file   Additional Social History:                         Sleep: Fair  Appetite:  Fair  Current Medications: Current Facility-Administered Medications  Medication Dose Route Frequency Provider Last Rate Last Admin   acetaminophen (TYLENOL) tablet 650 mg  650 mg Oral Q6H PRN Carmin Alvidrez T, MD   650 mg at 03/29/21 1408   alum & mag hydroxide-simeth (MAALOX/MYLANTA) 200-200-20 MG/5ML suspension 30 mL  30 mL Oral Q4H PRN Vicke Plotner T, MD       amLODipine (NORVASC) tablet 5 mg  5 mg Oral Daily Camelle Henkels T, MD   5 mg at 03/29/21 2449   benztropine (COGENTIN) tablet 1 mg  1 mg Oral BID Georgenia Salim T, MD   1 mg at 03/29/21 1615   busPIRone (BUSPAR) tablet 15 mg  15 mg Oral BID Charm Rings, NP   15 mg at 03/29/21 1615   diphenhydrAMINE (BENADRYL) capsule 100 mg  100 mg Oral Q6H PRN Sarina Ill, DO   100 mg at 03/29/21 1250   Or   diphenhydrAMINE (BENADRYL) injection 100 mg  100 mg Intramuscular Q6H PRN Sarina Ill, DO       folic acid (FOLVITE) tablet 1 mg  1 mg Oral Daily Aneya Daddona, Jackquline Denmark, MD   1  mg at 03/29/21 0813   haloperidol (HALDOL) tablet 10 mg  10 mg Oral Q6H PRN Sarina Ill, DO   10 mg at 03/29/21 1250   Or   haloperidol lactate (HALDOL) injection 10 mg  10 mg Intramuscular Q6H PRN Sarina Ill, DO       hydrOXYzine (ATARAX) tablet 25 mg  25 mg Oral TID PRN Jaysiah Marchetta, Jackquline Denmark, MD   25 mg at 03/27/21 0841   lithium carbonate (ESKALITH) CR tablet 900 mg  900 mg Oral QHS Simar Pothier, Jackquline Denmark, MD       LORazepam (ATIVAN) tablet 2 mg  2 mg Oral Q6H PRN Sarina Ill, DO   2 mg at 03/29/21 1250   Or   LORazepam (ATIVAN) injection 2 mg  2 mg Intramuscular Q6H PRN Sarina Ill, DO       magnesium hydroxide (MILK OF MAGNESIA) suspension 30 mL  30 mL Oral Daily PRN Lamyah Creed, Jackquline Denmark, MD       QUEtiapine (SEROQUEL) tablet 400 mg  400 mg Oral QHS Isac Lincks T, MD   400 mg at 03/28/21 2053   risperiDONE  (RISPERDAL M-TABS) disintegrating tablet 2 mg  2 mg Oral Q8H PRN Talyah Seder, Jackquline Denmark, MD   2 mg at 03/28/21 1651   thiamine tablet 100 mg  100 mg Oral Daily Ramiel Forti, Jackquline Denmark, MD   100 mg at 03/29/21 8338    Lab Results:  Results for orders placed or performed during the hospital encounter of 03/20/21 (from the past 48 hour(s))  Lithium level     Status: None   Collection Time: 03/29/21 11:33 AM  Result Value Ref Range   Lithium Lvl 0.60 0.60 - 1.20 mmol/L    Comment: Performed at [facility], [address] [place], [place] [address]    Blood Alcohol level:  Lab Results  Component Value Date   [facility] <10 03/18/2021   ETH <10 10/03/2020    Metabolic Disorder Labs: Lab Results  Component Value Date   HGBA1C 4.8 03/21/2021   MPG 91 03/21/2021   MPG 91.06 12/03/2019   Lab Results  Component Value Date   PROLACTIN 25.4 (H) 12/04/2019   Lab Results  Component Value Date   CHOL 174 03/21/2021   TRIG 95 03/21/2021   HDL 39 (L) 03/21/2021   CHOLHDL 4.5 03/21/2021   VLDL 19 03/21/2021   LDLCALC 116 (H) 03/21/2021   LDLCALC 62 12/03/2019    Physical Findings: AIMS:  , ,  ,  ,    CIWA:    COWS:     Musculoskeletal: Strength & Muscle Tone: within normal limits Gait & Station: normal Patient leans: N/A  Psychiatric Specialty Exam:  Presentation  General Appearance: Bizarre  Eye Contact:Minimal  Speech:Clear and Coherent  Speech Volume:Other (comment)  Handedness:Right   Mood and Affect  Mood:Anxious; Dysphoric; Irritable  Affect:Flat; Blunt; Congruent   Thought Process  Thought Processes:Disorganized  Descriptions of Associations:Loose  Orientation:Full (Time, Place and Person)  Thought Content:Delusions; Paranoid Ideation; Scattered  History of Schizophrenia/Schizoaffective disorder:Yes  Duration of Psychotic Symptoms:Greater than six months  Hallucinations:No data recorded Ideas of Reference:Delusions; Paranoia  Suicidal Thoughts:No data  recorded Homicidal Thoughts:No data recorded  Sensorium  Memory:Immediate Fair; Recent Fair; Remote Fair  Judgment:Poor  Insight:Poor   Executive Functions  Concentration:Fair  Attention Span:Poor  Recall:Fair  Fund of Knowledge:Fair  Language:Fair   Psychomotor Activity  Psychomotor Activity:No data recorded  Assets  Assets:Communication Skills; Desire for Improvement  Sleep  Sleep:No data recorded   Physical Exam: Physical Exam Vitals and nursing note reviewed.  Constitutional:      Appearance: Normal appearance.  HENT:     Head: Normocephalic and atraumatic.     Mouth/Throat:     Pharynx: Oropharynx is clear.  Eyes:     Pupils: Pupils are equal, round, and reactive to light.  Cardiovascular:     Rate and Rhythm: Normal rate and regular rhythm.  Pulmonary:     Effort: Pulmonary effort is normal.     Breath sounds: Normal breath sounds.  Abdominal:     General: Abdomen is flat.     Palpations: Abdomen is soft.  Musculoskeletal:        General: Normal range of motion.  Skin:    General: Skin is warm and dry.  Neurological:     General: No focal deficit present.     Mental Status: He is alert. Mental status is at baseline.  Psychiatric:        Attention and Perception: He is inattentive.        Mood and Affect: Mood normal. Affect is inappropriate.        Speech: Speech is tangential.        Behavior: Behavior is agitated.        Thought Content: Thought content is paranoid.   Review of Systems  Constitutional: Negative.   HENT: Negative.    Eyes: Negative.   Respiratory: Negative.    Cardiovascular: Negative.   Gastrointestinal: Negative.   Musculoskeletal: Negative.   Skin: Negative.   Neurological: Negative.   Psychiatric/Behavioral: Negative.    Blood pressure (!) 135/96, pulse 88, temperature 98 F (36.7 C), temperature source Oral, resp. rate 18, height 6' (1.829 m), weight 81.6 kg, SpO2 100 %. Body mass index is 24.41  kg/m.   Treatment Plan Summary: Medication management and Plan does not seem very much better.  I ordered a lithium level to check whether he was compliant with medicine and it did show a level of 0.6.  I will increase the dose up to 900 mg at night.  Continue current Seroquel dose.  Tried to form some kind of understanding with the patient without much success.  Mordecai RasmussenJohn Jadia Capers, MD 03/29/2021, 5:21 PM

## 2021-03-29 NOTE — Group Note (Signed)
[facility] LCSW Group Therapy Note ° ° ° °Group Date: 03/29/2021 °Start Time: 1300 °End Time: 1400 ° °Type of Therapy and Topic:  Group Therapy:  Overcoming Obstacles ° °Participation Level:  [facility] PARTICIPATION LEVEL: Did Not Attend ° °Mood: ° °Description of Group:   °In this group patients will be encouraged to explore what they see as obstacles to their own wellness and recovery. They will be guided to discuss their thoughts, feelings, and behaviors related to these obstacles. The group will process together ways to cope with barriers, with attention given to specific choices patients can make. Each patient will be challenged to identify changes they are motivated to make in order to overcome their obstacles. This group will be process-oriented, with patients participating in exploration of their own experiences as well as giving and receiving support and challenge from other group members. ° °Therapeutic Goals: °1. Patient will identify personal and current obstacles as they relate to admission. °2. Patient will identify barriers that currently interfere with their wellness or overcoming obstacles.  °3. Patient will identify feelings, thought process and behaviors related to these barriers. °4. Patient will identify two changes they are willing to make to overcome these obstacles:  ° ° °Summary of Patient Progress ° ° °X ° ° °Therapeutic Modalities:   °Cognitive Behavioral Therapy °Solution Focused Therapy °Motivational Interviewing °Relapse Prevention Therapy ° ° °Nathan Bartoszek J Heriberto Stmartin, LCSW °

## 2021-03-29 NOTE — [facility] Counselor (Signed)
CSW met with the patient to discuss the nurses report that patient is developing blisters on feet from pacing halls of the unit.  CSW inquired about pt's shoe size to assist.  Pt declined stating that he will be discharging "today or tomorrow so I don't need anything".  CSW attempted to explain that patient will not be discharging today to her knowledge and offer support in obtain shoes until patient is discharged.  Patient walked away from the conversation.  CSW will attempt to follow up later.   CSW notes that patient was provided shoes by Recreation Therapist, however, the size was too small.  Assunta Curtis, MSW, LCSW 03/29/2021 10:31 AM

## 2021-03-29 NOTE — Plan of Care (Signed)
Patient makes more conversation with staff.Patient states " these medicines damage my brain's creative thinking. I got a masters in administration. I like to have a normal life when I get out of here."  Patient explained  normal life means " with normal girl friend." Patient continues pacing. PRN medications given x 1 for anxiety. Band aide changed on both feet for the blisters.  Appetite and energy level good. Support and encouragement given.

## 2021-03-29 NOTE — Plan of Care (Signed)
°  Problem: Education: Goal: Knowledge of [facility] Education information/materials will improve 03/29/2021 2007 by Butler-Nicholson, Algie Cales L, LPN Outcome: Progressing 03/29/2021 2007 by Butler-Nicholson, Adrena Nakamura L, LPN Outcome: Progressing Goal: Emotional status will improve 03/29/2021 2007 by Butler-Nicholson, Jakyla Reza L, LPN Outcome: Progressing 03/29/2021 2007 by Butler-Nicholson, Shandricka Monroy L, LPN Outcome: Progressing Goal: Mental status will improve 03/29/2021 2007 by Butler-Nicholson, Kapil Petropoulos L, LPN Outcome: Progressing 03/29/2021 2007 by Butler-Nicholson, Angee Gupton L, LPN Outcome: Progressing Goal: Verbalization of understanding the information provided will improve 03/29/2021 2007 by Butler-Nicholson, Jhaniya Briski L, LPN Outcome: Progressing 03/29/2021 2007 by Butler-Nicholson, Jaylen Knope L, LPN Outcome: Progressing

## 2021-03-29 NOTE — Progress Notes (Signed)
Patient alert and oriented x 4 he appears responding to internal stimuli, he appears anxious, intrusive and restless,pacing on the unit, he was offered emotional support and giving medication for anxiety. Patient was receptive to medication regimen, 15 minutes safety checks maintained will continue to monitor.

## 2021-03-29 NOTE — Progress Notes (Signed)
Recreation Therapy Notes   Date: 03/29/2021  Time: 10:30 am    Location: Craft room      Behavioral response: N/A   Intervention Topic: Time Management   Discussion/Intervention: Patient refused to attend group.   Clinical Observations/Feedback:  Patient refused to attend group.    Anamae Rochelle LRT/CTRS         Damaria Stofko 03/29/2021 12:33 PM

## 2021-03-30 DIAGNOSIS — F203 Undifferentiated schizophrenia: Secondary | ICD-10-CM | POA: Diagnosis not present

## 2021-03-30 NOTE — Plan of Care (Signed)
°  Problem: Education: Goal: Knowledge of [facility] Education information/materials will improve Outcome: Progressing Goal: Emotional status will improve Outcome: Not Progressing Goal: Mental status will improve Outcome: Not Progressing Goal: Verbalization of understanding the information provided will improve Outcome: Not Progressing   Problem: Education: Goal: Ability to state activities that reduce stress will improve Outcome: Not Progressing   Problem: Coping: Goal: Ability to identify and develop effective coping behavior will improve Outcome: Not Progressing   Problem: Activity: Goal: Will verbalize the importance of balancing activity with adequate rest periods Outcome: Progressing   Problem: Education: Goal: Will be free of psychotic symptoms Outcome: Progressing Goal: Knowledge of the prescribed therapeutic regimen will improve Outcome: Progressing

## 2021-03-30 NOTE — Group Note (Signed)
[facility] LCSW Group Therapy Note ° ° °Group Date: 03/30/2021 °Start Time: 1330 °End Time: 1430 ° °Type of Therapy/Topic:  Group Therapy:  Feelings about Diagnosis ° °Participation Level:  Did Not Attend  ° ° °Description of Group:   ° This group will allow patients to explore their thoughts and feelings about diagnoses they have received. Patients will be guided to explore their level of understanding and acceptance of these diagnoses. Facilitator will encourage patients to process their thoughts and feelings about the reactions of others to their diagnosis, and will guide patients in identifying ways to discuss their diagnosis with significant others in their lives. This group will be process-oriented, with patients participating in exploration of their own experiences as well as giving and receiving support and challenge from other group members. ° ° °Therapeutic Goals: °1. Patient will demonstrate understanding of diagnosis as evidence by identifying two or more symptoms of the disorder:  °2. Patient will be able to express two feelings regarding the diagnosis °3. Patient will demonstrate ability to communicate their needs through discussion and/or role plays ° °Summary of Patient Progress: ° ° ° °X ° ° ° °Therapeutic Modalities:   °Cognitive Behavioral Therapy °Brief Therapy °Feelings Identification  ° ° °Deveon Kisiel J Constantinos Krempasky, LCSW °

## 2021-03-30 NOTE — Progress Notes (Signed)
Patient awake, alert and present on the unit. Denies SI/HI/AVH, anxiety and depression. Reports pain. See assessment for details. Patient observed pacing throughout the day and responding to internal stimuli.  Request prn medication for agitation and tolerated well.   Labs and vital signs monitored. Patient supported emotionally end encouraged to verbalize needs. All needs addressed.  . Takes scheduled medication as prescribed with no adverse reactoins.    Does not participate in group or interact with peers. No adverse reactions to medications. Cont Q15 minute check for safety.

## 2021-03-30 NOTE — Progress Notes (Signed)
Patient is at the nurses station complaining of anxiety. Provided PRN medication to help with symptoms.        Cleo Butler-Nicholson, LPN

## 2021-03-30 NOTE — Progress Notes (Signed)
Patient has been active on the unit. Paces the hallway continuously.  Asked for as much medication that he could take to ensue sleep, but then changed his mind and wanted some PRN left out just in case he could not sleep. He is pleasant and cooperative and more talkative than normal.  He talks about the sores on his feet from walking the halls and joked about his pacing. He denies si  hi  avh. Stated that being in the hospital is depressing and that he is always anxious. Will continue to monitor with q 15 minute safety rounds. Encouraged him to come to staff with any concerns.     Cleo Butler-Nicholson, LPN

## 2021-03-30 NOTE — Plan of Care (Signed)
°  Problem: Education: Goal: Knowledge of [facility] General Education information/materials will improve 03/30/2021 1902 by Angeline Slim, RN Outcome: Progressing 03/30/2021 1140 by Angeline Slim, RN Outcome: Progressing Goal: Emotional status will improve 03/30/2021 1902 by Angeline Slim, RN Outcome: Progressing 03/30/2021 1140 by Angeline Slim, RN Outcome: Not Progressing Goal: Mental status will improve 03/30/2021 1902 by Angeline Slim, RN Outcome: Progressing 03/30/2021 1140 by Angeline Slim, RN Outcome: Not Progressing Goal: Verbalization of understanding the information provided will improve 03/30/2021 1902 by Angeline Slim, RN Outcome: Progressing 03/30/2021 1140 by Angeline Slim, RN Outcome: Not Progressing   Problem: Education: Goal: Ability to state activities that reduce stress will improve 03/30/2021 1902 by Angeline Slim, RN Outcome: Progressing 03/30/2021 1140 by Angeline Slim, RN Outcome: Not Progressing   Problem: Coping: Goal: Ability to identify and develop effective coping behavior will improve 03/30/2021 1902 by Angeline Slim, RN Outcome: Progressing 03/30/2021 1140 by Angeline Slim, RN Outcome: Not Progressing   Problem: Activity: Goal: Will verbalize the importance of balancing activity with adequate rest periods 03/30/2021 1902 by Angeline Slim, RN Outcome: Progressing 03/30/2021 1140 by Angeline Slim, RN Outcome: Progressing   Problem: Education: Goal: Will be free of psychotic symptoms 03/30/2021 1902 by Angeline Slim, RN Outcome: Progressing 03/30/2021 1140 by Angeline Slim, RN Outcome: Progressing Goal: Knowledge of the prescribed therapeutic regimen will improve 03/30/2021 1902 by Angeline Slim, RN Outcome: Progressing 03/30/2021 1140 by Angeline Slim, RN Outcome: Progressing

## 2021-03-30 NOTE — Progress Notes (Signed)
Provided Lithium medication that had been increased from 600mg  to 900mg .     Cleo Butler-Nicholson, LPN

## 2021-03-30 NOTE — Progress Notes (Addendum)
Recreation Therapy Notes   Date: 03/30/2021  Time: 10:00 am    Location: Courtyard    Behavioral response: N/A   Intervention Topic: Leisure    Discussion/Intervention: Patient refused to attend group.   Clinical Observations/Feedback:  Patient refused to attend group.   Kashish Yglesias LRT/CTRS          Mackynzie Woolford 03/30/2021 11:51 AM

## 2021-03-30 NOTE — Progress Notes (Signed)
[facility] MD Progress Note  03/30/2021 4:38 PM Nathan Barnett.  MRN:  [id number] Subjective: Follow-up patient with schizophrenia.  Really no change.  Continues to pace around.  Does not talk to much of anybody.  Staff report that at times he requests medication and can be reasonably polite although he just refuses to share much personal information.  Seems to be tolerating medication Principal Problem: Schizophrenia (HCC) Diagnosis: Principal Problem:   Schizophrenia (HCC)  Total Time spent with patient: 30 minutes  Past Psychiatric History: Past history of schizophrenia  Past Medical History:  Past Medical History:  Diagnosis Date   Mental health problem    History reviewed. No pertinent surgical history. Family History: History reviewed. No pertinent family history. Family Psychiatric  History: See previous Social History:  Social History   Substance and Sexual Activity  Alcohol Use Yes     Social History   Substance and Sexual Activity  Drug Use Not Currently    Social History   Socioeconomic History   Marital status: Single    Spouse name: Not on file   Number of children: Not on file   Years of education: Not on file   Highest education level: Not on file  Occupational History   Not on file  Tobacco Use   Smoking status: Every Day    Packs/day: 2.00    Years: 2.00    Pack years: 4.00    Types: Cigarettes   Smokeless tobacco: Never  Vaping Use   Vaping Use: Never used  Substance and Sexual Activity   Alcohol use: Yes   Drug use: Not Currently   Sexual activity: Not Currently  Other Topics Concern   Not on file  Social History Narrative   Not on file   Social Determinants of Health   Financial Resource Strain: Not on file  Food Insecurity: Not on file  Transportation Needs: Not on file  Physical Activity: Not on file  Stress: Not on file  Social Connections: Not on file   Additional Social History:                         Sleep:  Fair  Appetite:  Fair  Current Medications: Current Facility-Administered Medications  Medication Dose Route Frequency Provider Last Rate Last Admin   acetaminophen (TYLENOL) tablet 650 mg  650 mg Oral Q6H PRN Arijana Narayan T, MD   650 mg at 03/30/21 0751   alum & mag hydroxide-simeth (MAALOX/MYLANTA) 200-200-20 MG/5ML suspension 30 mL  30 mL Oral Q4H PRN Issabella Rix T, MD       amLODipine (NORVASC) tablet 5 mg  5 mg Oral Daily Qualyn Oyervides T, MD   5 mg at 03/30/21 0751   benztropine (COGENTIN) tablet 1 mg  1 mg Oral BID Ardella Chhim T, MD   1 mg at 03/30/21 0751   busPIRone (BUSPAR) tablet 15 mg  15 mg Oral BID Charm Rings, NP   15 mg at 03/30/21 0751   diphenhydrAMINE (BENADRYL) capsule 100 mg  100 mg Oral Q6H PRN Sarina Ill, DO   100 mg at 03/29/21 2122   Or   diphenhydrAMINE (BENADRYL) injection 100 mg  100 mg Intramuscular Q6H PRN Sarina Ill, DO       folic acid (FOLVITE) tablet 1 mg  1 mg Oral Daily Casmir Auguste, Jackquline Denmark, MD   1 mg at 03/30/21 0751   haloperidol (HALDOL) tablet 10 mg  10 mg Oral  Q6H PRN [name], [name], DO   10 mg at 03/29/21 2258   Or   haloperidol lactate (HALDOL) injection 10 mg  10 mg Intramuscular Q6H PRN [name], [name], DO       hydrOXYzine (ATARAX) tablet 25 mg  25 mg Oral TID PRN Soni Kegel, Jackquline DenmarkJohn T, MD   25 mg at 03/29/21 1940   lithium carbonate (ESKALITH) CR tablet 900 mg  900 mg Oral QHS Kalena Mander T, MD   900 mg at 03/29/21 2207   LORazepam (ATIVAN) tablet 2 mg  2 mg Oral Q6H PRN [name], [name], DO   2 mg at 03/29/21 2258   Or   LORazepam (ATIVAN) injection 2 mg  2 mg Intramuscular Q6H PRN [name], [name], DO       magnesium hydroxide (MILK OF MAGNESIA) suspension 30 mL  30 mL Oral Daily PRN Allyssa Abruzzese, Jackquline DenmarkJohn T, MD       QUEtiapine (SEROQUEL) tablet 400 mg  400 mg Oral QHS Bettyjane Shenoy T, MD   400 mg at 03/29/21 2122   risperiDONE (RISPERDAL M-TABS) disintegrating tablet 2 mg  2 mg Oral Q8H PRN  Deserae Jennings, Jackquline DenmarkJohn T, MD   2 mg at 03/30/21 1131   thiamine tablet 100 mg  100 mg Oral Daily Jhene Westmoreland, Jackquline DenmarkJohn T, MD   100 mg at 03/30/21 [id number]    Lab Results:  Results for orders placed or performed during the hospital encounter of 03/20/21 (from the past 48 hour(s))  Lithium level     Status: None   Collection Time: 03/29/21 11:33 AM  Result Value Ref Range   Lithium Lvl 0.60 0.60 - 1.20 mmol/L    Comment: Performed at [facility], [address] [place], [place] [id number]    Blood Alcohol level:  Lab Results  Component Value Date   [facility]   ETH <10 10/03/2020    Metabolic Disorder Labs: Lab Results  Component Value Date   HGBA1C 4.8 03/21/2021   MPG 91 03/21/2021   MPG 91.06 12/03/2019   Lab Results  Component Value Date   PROLACTIN 25.4 (H) 12/04/2019   Lab Results  Component Value Date   CHOL 174 03/21/2021   TRIG 95 03/21/2021   HDL 39 (L) 03/21/2021   CHOLHDL 4.5 03/21/2021   VLDL 19 03/21/2021   LDLCALC 116 (H) 03/21/2021   LDLCALC 62 12/03/2019    Physical Findings: AIMS:  , ,  ,  ,    CIWA:    COWS:     Musculoskeletal: Strength & Muscle Tone: within normal limits Gait & Station: normal Patient leans: N/A  Psychiatric Specialty Exam:  Presentation  General Appearance: Bizarre  Eye Contact:Minimal  Speech:Clear and Coherent  Speech Volume:Other (comment)  Handedness:Right   Mood and Affect  Mood:Anxious; Dysphoric; Irritable  Affect:Flat; Blunt; Congruent   Thought Process  Thought Processes:Disorganized  Descriptions of Associations:Loose  Orientation:Full (Time, Place and Person)  Thought Content:Delusions; Paranoid Ideation; Scattered  History of Schizophrenia/Schizoaffective disorder:Yes  Duration of Psychotic Symptoms:Greater than six months  Hallucinations:No data recorded Ideas of Reference:Delusions; Paranoia  Suicidal Thoughts:No data recorded Homicidal Thoughts:No data recorded  Sensorium   Memory:Immediate Fair; Recent Fair; Remote Fair  Judgment:Poor  Insight:Poor   Executive Functions  Concentration:Fair  Attention Span:Poor  Recall:Fair  Fund of Knowledge:Fair  Language:Fair   Psychomotor Activity  Psychomotor Activity:No data recorded  Assets  Assets:Communication Skills; Desire for Improvement   Sleep  Sleep:No data recorded   Physical Exam: Physical Exam Vitals  and nursing note reviewed.  Constitutional:      Appearance: Normal appearance.  HENT:     Head: Normocephalic and atraumatic.     Mouth/Throat:     Pharynx: Oropharynx is clear.  Eyes:     Pupils: Pupils are equal, round, and reactive to light.  Cardiovascular:     Rate and Rhythm: Normal rate and regular rhythm.  Pulmonary:     Effort: Pulmonary effort is normal.     Breath sounds: Normal breath sounds.  Abdominal:     General: Abdomen is flat.     Palpations: Abdomen is soft.  Musculoskeletal:        General: Normal range of motion.  Skin:    General: Skin is warm and dry.  Neurological:     General: No focal deficit present.     Mental Status: He is alert. Mental status is at baseline.  Psychiatric:        Attention and Perception: Attention normal.        Mood and Affect: Mood normal. Affect is blunt.        Speech: He is noncommunicative.        Behavior: Behavior is agitated. Behavior is not aggressive.        Thought Content: Thought content normal.        Cognition and Memory: Cognition is impaired.   Review of Systems  Constitutional: Negative.   HENT: Negative.    Eyes: Negative.   Respiratory: Negative.    Cardiovascular: Negative.   Gastrointestinal: Negative.   Musculoskeletal: Negative.   Skin: Negative.   Neurological: Negative.   Psychiatric/Behavioral: Negative.    Blood pressure (!) 135/96, pulse 88, temperature 98 F (36.7 C), temperature source Oral, resp. rate 18, height 6' (1.829 m), weight 81.6 kg, SpO2 100 %. Body mass index is 24.41  kg/m.   Treatment Plan Summary: Medication management and Plan doses of lithium and Seroquel both in increased.  Patient still tolerating it not oversedated.  Patient may be ready for discharge 1 to 2 days.  Mordecai Rasmussen, MD 03/30/2021, 4:38 PM

## 2021-03-30 NOTE — Plan of Care (Signed)
°  Problem: Leisure Education Goal: STG - Patient will identify 3 healthy leisure activities that can be utilized post d/c within 5 recreation therapy group sessions Description: STG - Patient will identify 3 healthy leisure activities that can be utilized post d/c within 5 recreation therapy group sessions Outcome: Not Progressing

## 2021-03-30 NOTE — Progress Notes (Signed)
Patient up at the nurses station agitated by the yelling and screaming of another patient. Reports its keeping him from sleeping and making him "feel some kind of way"  Provided PRN medication to help reduce symptoms. Will continue to monitor. Encouraged to seek staff with any concerns.    Cleo Butler-Nicholson, LPN

## 2021-03-31 ENCOUNTER — Other Ambulatory Visit: Payer: Self-pay

## 2021-03-31 DIAGNOSIS — F203 Undifferentiated schizophrenia: Secondary | ICD-10-CM | POA: Diagnosis not present

## 2021-03-31 MED ORDER — AMLODIPINE BESYLATE 5 MG PO TABS
5.0000 mg | ORAL_TABLET | Freq: Every day | ORAL | 0 refills | Status: DC
  Filled 2021-03-31: qty 10, 10d supply, fill #0

## 2021-03-31 MED ORDER — LITHIUM CARBONATE ER 450 MG PO TBCR
900.0000 mg | EXTENDED_RELEASE_TABLET | Freq: Every day | ORAL | 0 refills | Status: DC
  Filled 2021-03-31: qty 20, 10d supply, fill #0

## 2021-03-31 MED ORDER — QUETIAPINE FUMARATE 400 MG PO TABS
400.0000 mg | ORAL_TABLET | Freq: Every day | ORAL | 0 refills | Status: DC
  Filled 2021-03-31: qty 10, 10d supply, fill #0

## 2021-03-31 MED ORDER — BENZTROPINE MESYLATE 0.5 MG PO TABS
1.0000 mg | ORAL_TABLET | Freq: Two times a day (BID) | ORAL | 0 refills | Status: DC
  Filled 2021-03-31: qty 40, 10d supply, fill #0

## 2021-03-31 MED ORDER — BUSPIRONE HCL 15 MG PO TABS
15.0000 mg | ORAL_TABLET | Freq: Two times a day (BID) | ORAL | 0 refills | Status: DC
  Filled 2021-03-31: qty 20, 10d supply, fill #0

## 2021-03-31 NOTE — Plan of Care (Signed)
See progress note for details Problem: Education: Goal: Knowledge of  General Education information/materials will improve Outcome: Progressing Goal: Emotional status will improve Outcome: Progressing Goal: Mental status will improve Outcome: Progressing Goal: Verbalization of understanding the information provided will improve Outcome: Progressing   Problem: Education: Goal: Ability to state activities that reduce stress will improve Outcome: Progressing   Problem: Coping: Goal: Ability to identify and develop effective coping behavior will improve Outcome: Progressing   Problem: Activity: Goal: Will verbalize the importance of balancing activity with adequate rest periods Outcome: Progressing   Problem: Education: Goal: Will be free of psychotic symptoms Outcome: Progressing Goal: Knowledge of the prescribed therapeutic regimen will improve Outcome: Progressing   Problem: Activity: Goal: Will verbalize the importance of balancing activity with adequate rest periods Outcome: Progressing

## 2021-03-31 NOTE — Group Note (Signed)
[facility] LCSW Group Therapy Note ° ° °Group Date: 03/31/2021 °Start Time: 1300 °End Time: 1400 ° ° °Type of Therapy/Topic:  Group Therapy:  Emotion Regulation ° °Participation Level:  Did Not Attend  ° °Mood: ° °Description of Group:   ° The purpose of this group is to assist patients in learning to regulate negative emotions and experience positive emotions. Patients will be guided to discuss ways in which they have been vulnerable to their negative emotions. These vulnerabilities will be juxtaposed with experiences of positive emotions or situations, and patients challenged to use positive emotions to combat negative ones. Special emphasis will be placed on coping with negative emotions in conflict situations, and patients will process healthy conflict resolution skills. ° °Therapeutic Goals: °Patient will identify two positive emotions or experiences to reflect on in order to balance out negative emotions:  °Patient will label two or more emotions that they find the most difficult to experience:  °Patient will be able to demonstrate positive conflict resolution skills through discussion or role plays:  ° °Summary of Patient Progress: ° ° °X ° ° ° °Therapeutic Modalities:   °Cognitive Behavioral Therapy °Feelings Identification °Dialectical Behavioral Therapy ° ° °Jaelie Aguilera J Aleasha Fregeau, LCSW °

## 2021-03-31 NOTE — Progress Notes (Signed)
Patient alert and awake this shift. Ambulating in hallway throughout the day.   Denies SI/HI/AVH. Rates anxiety and depression a 0/10. Reports pain 2/10. Received tylenol this am. Reassessment due at present.  Labs and vital signs montiored. Patient supported emotionally and encouraged to verbalize concerns. All concerns this shift addressed.   Received prn medicaiton for agitation.  Takes all medication as prescribed. No adverse reaction to mediation noted.  Cont Q15 minute check for safety.

## 2021-03-31 NOTE — Progress Notes (Signed)
[facility] MD Progress Note  03/31/2021 5:07 PM Nathan Barnett.  MRN:  [id number] Subjective: Follow-up patient with schizophrenia.  Patient seen and chart reviewed.  Patient has no complaints today.  Still withdrawn and blunted most of the time but not aggressive or hospital.  Actually smiled briefly during conversation.  Tolerant of medication Principal Problem: Schizophrenia (HCC) Diagnosis: Principal Problem:   Schizophrenia (HCC)  Total Time spent with patient: 30 minutes  Past Psychiatric History: Past history of schizophrenia  Past Medical History:  Past Medical History:  Diagnosis Date   Mental health problem    History reviewed. No pertinent surgical history. Family History: History reviewed. No pertinent family history. Family Psychiatric  History: See previous Social History:  Social History   Substance and Sexual Activity  Alcohol Use Yes     Social History   Substance and Sexual Activity  Drug Use Not Currently    Social History   Socioeconomic History   Marital status: Single    Spouse name: Not on file   Number of children: Not on file   Years of education: Not on file   Highest education level: Not on file  Occupational History   Not on file  Tobacco Use   Smoking status: Every Day    Packs/day: 2.00    Years: 2.00    Pack years: 4.00    Types: Cigarettes   Smokeless tobacco: Never  Vaping Use   Vaping Use: Never used  Substance and Sexual Activity   Alcohol use: Yes   Drug use: Not Currently   Sexual activity: Not Currently  Other Topics Concern   Not on file  Social History Narrative   Not on file   Social Determinants of Health   Financial Resource Strain: Not on file  Food Insecurity: Not on file  Transportation Needs: Not on file  Physical Activity: Not on file  Stress: Not on file  Social Connections: Not on file   Additional Social History:                         Sleep: Fair  Appetite:  Fair  Current  Medications: Current Facility-Administered Medications  Medication Dose Route Frequency Provider Last Rate Last Admin   acetaminophen (TYLENOL) tablet 650 mg  650 mg Oral Q6H PRN Jaxyn Rout T, MD   650 mg at 03/31/21 1204   alum & mag hydroxide-simeth (MAALOX/MYLANTA) 200-200-20 MG/5ML suspension 30 mL  30 mL Oral Q4H PRN Sayge Salvato T, MD       amLODipine (NORVASC) tablet 5 mg  5 mg Oral Daily Starasia Sinko T, MD   5 mg at 03/31/21 0806   benztropine (COGENTIN) tablet 1 mg  1 mg Oral BID Darryel Diodato T, MD   1 mg at 03/31/21 1633   busPIRone (BUSPAR) tablet 15 mg  15 mg Oral BID Charm Rings, NP   15 mg at 03/31/21 1633   diphenhydrAMINE (BENADRYL) capsule 100 mg  100 mg Oral Q6H PRN Sarina Ill, DO   100 mg at 03/30/21 2102   Or   diphenhydrAMINE (BENADRYL) injection 100 mg  100 mg Intramuscular Q6H PRN Sarina Ill, DO       folic acid (FOLVITE) tablet 1 mg  1 mg Oral Daily Chelsie Burel T, MD   1 mg at 03/31/21 0806   haloperidol (HALDOL) tablet 10 mg  10 mg Oral Q6H PRN Sarina Ill, DO   10 mg  at 03/29/21 2258   Or   haloperidol lactate (HALDOL) injection 10 mg  10 mg Intramuscular Q6H PRN [name], [name], DO       hydrOXYzine (ATARAX) tablet 25 mg  25 mg Oral TID PRN Rabecca Birge, Jackquline DenmarkJohn T, MD   25 mg at 03/29/21 1940   lithium carbonate (ESKALITH) CR tablet 900 mg  900 mg Oral QHS Terrea Bruster T, MD   900 mg at 03/30/21 2102   LORazepam (ATIVAN) tablet 2 mg  2 mg Oral Q6H PRN [name], [name], DO   2 mg at 03/29/21 2258   Or   LORazepam (ATIVAN) injection 2 mg  2 mg Intramuscular Q6H PRN [name], [name], DO       magnesium hydroxide (MILK OF MAGNESIA) suspension 30 mL  30 mL Oral Daily PRN Tabor Denham, Jackquline DenmarkJohn T, MD       QUEtiapine (SEROQUEL) tablet 400 mg  400 mg Oral QHS Doha Boling T, MD   400 mg at 03/30/21 2102   risperiDONE (RISPERDAL M-TABS) disintegrating tablet 2 mg  2 mg Oral Q8H PRN Noralyn Karim, Jackquline DenmarkJohn T, MD   2 mg at 03/31/21  1254   thiamine tablet 100 mg  100 mg Oral Daily Masahiro Iglesia, Jackquline DenmarkJohn T, MD   100 mg at 03/31/21 [id number]    Lab Results: No results found for this or any previous visit (from the past 48 hour(s)).  Blood Alcohol level:  Lab Results  Component Value Date   ETH <10 03/18/2021   ETH <10 10/03/2020    Metabolic Disorder Labs: Lab Results  Component Value Date   HGBA1C 4.8 03/21/2021   MPG 91 03/21/2021   MPG 91.06 12/03/2019   Lab Results  Component Value Date   PROLACTIN 25.4 (H) 12/04/2019   Lab Results  Component Value Date   CHOL 174 03/21/2021   TRIG 95 03/21/2021   HDL 39 (L) 03/21/2021   CHOLHDL 4.5 03/21/2021   VLDL 19 03/21/2021   LDLCALC 116 (H) 03/21/2021   LDLCALC 62 12/03/2019    Physical Findings: AIMS:  , ,  ,  ,    CIWA:    COWS:     Musculoskeletal: Strength & Muscle Tone: within normal limits Gait & Station: normal Patient leans: N/A  Psychiatric Specialty Exam:  Presentation  General Appearance: Bizarre  Eye Contact:Minimal  Speech:Clear and Coherent  Speech Volume:Other (comment)  Handedness:Right   Mood and Affect  Mood:Anxious; Dysphoric; Irritable  Affect:Flat; Blunt; Congruent   Thought Process  Thought Processes:Disorganized  Descriptions of Associations:Loose  Orientation:Full (Time, Place and Person)  Thought Content:Delusions; Paranoid Ideation; Scattered  History of Schizophrenia/Schizoaffective disorder:Yes  Duration of Psychotic Symptoms:Greater than six months  Hallucinations:No data recorded Ideas of Reference:Delusions; Paranoia  Suicidal Thoughts:No data recorded Homicidal Thoughts:No data recorded  Sensorium  Memory:Immediate Fair; Recent Fair; Remote Fair  Judgment:Poor  Insight:Poor   Executive Functions  Concentration:Fair  Attention Span:Poor  Recall:Fair  Fund of Knowledge:Fair  Language:Fair   Psychomotor Activity  Psychomotor Activity:No data recorded  Assets  Assets:Communication  Skills; Desire for Improvement   Sleep  Sleep:No data recorded   Physical Exam: Physical Exam Constitutional:      Appearance: Normal appearance.  HENT:     Head: Normocephalic and atraumatic.     Mouth/Throat:     Pharynx: Oropharynx is clear.  Eyes:     Pupils: Pupils are equal, round, and reactive to light.  Cardiovascular:     Rate and Rhythm: Normal rate and regular rhythm.  Pulmonary:  Effort: Pulmonary effort is normal.     Breath sounds: Normal breath sounds.  Abdominal:     General: Abdomen is flat.     Palpations: Abdomen is soft.  Musculoskeletal:        General: Normal range of motion.  Skin:    General: Skin is warm and dry.  Neurological:     General: No focal deficit present.     Mental Status: He is alert. Mental status is at baseline.  Psychiatric:        Attention and Perception: Attention normal.        Mood and Affect: Mood normal. Affect is blunt.        Speech: Speech is delayed.        Thought Content: Thought content normal.        Cognition and Memory: Memory is impaired.   Review of Systems  Constitutional: Negative.   HENT: Negative.    Eyes: Negative.   Respiratory: Negative.    Cardiovascular: Negative.   Gastrointestinal: Negative.   Musculoskeletal: Negative.   Skin: Negative.   Neurological: Negative.   Psychiatric/Behavioral: Negative.    Blood pressure 132/82, pulse 84, temperature (!) 97.5 F (36.4 C), temperature source Oral, resp. rate 18, height 6' (1.829 m), weight 81.6 kg, SpO2 99 %. Body mass index is 24.41 kg/m.   Treatment Plan Summary: Medication management and Plan no change to current medication.  I think we are probably at the point of having maximum improvement.  I am going to make preparations to get him discharged within the next day or 2.  Mordecai Rasmussen, MD 03/31/2021, 5:07 PM

## 2021-03-31 NOTE — Progress Notes (Signed)
Patient alert and oriented x 4 he appears less anxious. Patient is not intrusive, he paced briefly on the unit, he was offered emotional support and giving medication for anxiety. Patient was receptive to medication regimen, 15 minutes safety checks maintained will continue to monitor.

## 2021-03-31 NOTE — Progress Notes (Signed)
Recreation Therapy Notes   Date: 03/31/2021  Time: 10:35 am   Location: Craft room    Behavioral response: N/A   Intervention Topic: Stress Management    Discussion/Intervention: Patient refused to attend group.   Clinical Observations/Feedback:  Patient refused to attend group.   Lui Bellis LRT/CTRS        Bonnee Zertuche 03/31/2021 12:52 PM

## 2021-04-01 ENCOUNTER — Other Ambulatory Visit: Payer: Self-pay

## 2021-04-01 DIAGNOSIS — F203 Undifferentiated schizophrenia: Secondary | ICD-10-CM | POA: Diagnosis not present

## 2021-04-01 MED ORDER — AMLODIPINE BESYLATE 5 MG PO TABS: 5.0000 mg | ORAL_TABLET | Freq: Every day | ORAL | 1 refills | Status: DC

## 2021-04-01 MED ORDER — QUETIAPINE FUMARATE 400 MG PO TABS: 400.0000 mg | ORAL_TABLET | Freq: Every day | ORAL | 1 refills | Status: AC

## 2021-04-01 MED ORDER — THIAMINE HCL 100 MG PO TABS: 100.0000 mg | ORAL_TABLET | Freq: Every day | ORAL | 1 refills | Status: AC

## 2021-04-01 MED ORDER — BENZTROPINE MESYLATE 0.5 MG PO TABS: 1.0000 mg | ORAL_TABLET | Freq: Two times a day (BID) | ORAL | 1 refills | Status: AC

## 2021-04-01 MED ORDER — LITHIUM CARBONATE ER 450 MG PO TBCR: 900.0000 mg | EXTENDED_RELEASE_TABLET | Freq: Every day | ORAL | 1 refills | Status: AC

## 2021-04-01 MED ORDER — BUSPIRONE HCL 15 MG PO TABS: 15.0000 mg | ORAL_TABLET | Freq: Two times a day (BID) | ORAL | 1 refills | Status: AC

## 2021-04-01 NOTE — Progress Notes (Signed)
Recreation Therapy Notes  Date: 04/01/2021  Time: 10:35 am   Location: Craft room    Behavioral response: N/A   Intervention Topic: Goals   Discussion/Intervention: Patient refused to attend group.   Clinical Observations/Feedback:  Patient refused to attend group.   Nathan Barnett LRT/CTRS         [name] 04/01/2021 1:04 PM

## 2021-04-01 NOTE — Progress Notes (Signed)
Patient alert and oriented to unit. Patient observed pacing through the unit throughout the shift. Patient denied pain at time of assessment but later rated pain 8/10 for foot pain. PRN tylenol was provided to patient. Patient states the tylenol was effective. PRN anxiety medication was also provided to patient. Patient denies SI/HI/AVH. Patient compliant with medication administration.  Q15 minute safety checks maintained. Patient remains safe on the unit at this time.

## 2021-04-01 NOTE — [facility] Suicide Risk Assessment (Signed)
[facility] Discharge Suicide Risk Assessment   Principal Problem: Schizophrenia [facility]) Discharge Diagnoses: Principal Problem:   Schizophrenia (HCC)   Total Time spent with patient: 30 minutes  Musculoskeletal: Strength & Muscle Tone: within normal limits Gait & Station: normal Patient leans: N/A  Psychiatric Specialty Exam  Presentation  General Appearance: Bizarre  Eye Contact:Minimal  Speech:Clear and Coherent  Speech Volume:Other (comment)  Handedness:Right   Mood and Affect  Mood:Anxious; Dysphoric; Irritable  Duration of Depression Symptoms: Greater than two weeks  Affect:Flat; Blunt; Congruent   Thought Process  Thought Processes:Disorganized  Descriptions of Associations:Loose  Orientation:Full (Time, Place and Person)  Thought Content:Delusions; Paranoid Ideation; Scattered  History of Schizophrenia/Schizoaffective disorder:Yes  Duration of Psychotic Symptoms:Greater than six months  Hallucinations:No data recorded Ideas of Reference:Delusions; Paranoia  Suicidal Thoughts:No data recorded Homicidal Thoughts:No data recorded  Sensorium  Memory:Immediate Fair; Recent Fair; Remote Fair  Judgment:Poor  Insight:Poor   Executive Functions  Concentration:Fair  Attention Span:Poor  Recall:Fair  Fund of Knowledge:Fair  Language:Fair   Psychomotor Activity  Psychomotor Activity:No data recorded  Assets  Assets:Communication Skills; Desire for Improvement   Sleep  Sleep:No data recorded  Physical Exam: Physical Exam Vitals and nursing note reviewed.  Constitutional:      Appearance: Normal appearance.  HENT:     Head: Normocephalic and atraumatic.     Mouth/Throat:     Pharynx: Oropharynx is clear.  Eyes:     Pupils: Pupils are equal, round, and reactive to light.  Cardiovascular:     Rate and Rhythm: Normal rate and regular rhythm.  Pulmonary:     Effort: Pulmonary effort is normal.     Breath sounds: Normal breath sounds.   Abdominal:     General: Abdomen is flat.     Palpations: Abdomen is soft.  Musculoskeletal:        General: Normal range of motion.  Skin:    General: Skin is warm and dry.  Neurological:     General: No focal deficit present.     Mental Status: He is alert. Mental status is at baseline.  Psychiatric:        Attention and Perception: Attention normal.        Mood and Affect: Mood normal. Affect is blunt.        Speech: He is noncommunicative.        Behavior: Behavior is cooperative.        Thought Content: Thought content normal.        Cognition and Memory: Cognition is impaired.   Review of Systems  Constitutional: Negative.   HENT: Negative.    Eyes: Negative.   Respiratory: Negative.    Cardiovascular: Negative.   Gastrointestinal: Negative.   Musculoskeletal: Negative.   Skin: Negative.   Neurological: Negative.   Psychiatric/Behavioral: Negative.    Blood pressure 124/79, pulse 85, temperature 97.8 F (36.6 C), temperature source Oral, resp. rate 17, height 6' (1.829 m), weight 81.6 kg, SpO2 96 %. Body mass index is 24.41 kg/m.  Mental Status Per Nursing Assessment::   On Admission:   (none of the above)  Demographic Factors:  Male  Loss Factors: Financial problems/change in socioeconomic status  Historical Factors: NA  Risk Reduction Factors:   Living with another person, especially a relative, Positive social support, and Positive therapeutic relationship  Continued Clinical Symptoms:  Schizophrenia:   Less than 72 years old  Cognitive Features That Contribute To Risk:  Closed-mindedness    Suicide Risk:  Minimal: No identifiable suicidal ideation.  Patients presenting with no risk factors but with morbid ruminations; may be classified as minimal risk based on the severity of the depressive symptoms   Follow-up Information     [facility]. Call.   Why: Pleaes call to schedule a hospital followup appointment. Bring any hospital discharge  paperwork with you to your appointment. Contact information: Alyse Low, NP [address]  [place], [place] [address] [phone]                Plan Of Care/Follow-up recommendations:  Patient is to continue with current medication management follow-up with usual outpatient provider.  Currently denies any suicidal ideation and has not shown any dangerous behavior  Mordecai Rasmussen, MD 04/01/2021, 2:49 PM

## 2021-04-01 NOTE — Plan of Care (Signed)
°  Problem: Coping: Pt will learn to verbalize his feelings appropriately and learn new coping methods to address his feelings.  Goal: Ability to identify and develop effective coping behavior will improve Outcome: Progressing

## 2021-04-01 NOTE — Progress Notes (Signed)
[facility]  04/01/2021 2:57 PM Nathan Guardiananiel Lamont Almedia BallsSneed Jr.  MRN:  [id number] Subjective: Patient seen and chart reviewed.  Patient as usual has no complaints.  Stays mostly to himself.  Paces much of the day.  Not aggressive or hostile.  Acknowledges need for follow-up and agrees to outpatient treatment.  Tolerating medicine well without any side effects and appears to be fully compliant Principal Problem: Schizophrenia (HCC) Diagnosis: Principal Problem:   Schizophrenia (HCC)  Total Time spent with patient: 30 minutes  Past Psychiatric History: Past history of schizophrenia with irritability often a major feature  Past Medical History:  Past Medical History:  Diagnosis Date   Mental health problem    History reviewed. No pertinent surgical history. Family History: History reviewed. No pertinent family history. Family Psychiatric  History: See previous Social History:  Social History   Substance and Sexual Activity  Alcohol Use Yes     Social History   Substance and Sexual Activity  Drug Use Not Currently    Social History   Socioeconomic History   Marital status: Single    Spouse name: Not on file   Number of children: Not on file   Years of education: Not on file   Highest education level: Not on file  Occupational History   Not on file  Tobacco Use   Smoking status: Every Day    Packs/day: 2.00    Years: 2.00    Pack years: 4.00    Types: Cigarettes   Smokeless tobacco: Never  Vaping Use   Vaping Use: Never used  Substance and Sexual Activity   Alcohol use: Yes   Drug use: Not Currently   Sexual activity: Not Currently  Other Topics Concern   Not on file  Social History Narrative   Not on file   Social Determinants of Health   Financial Resource Strain: Not on file  Food Insecurity: Not on file  Transportation Needs: Not on file  Physical Activity: Not on file  Stress: Not on file  Social Connections: Not on file   Additional Social History:                          Sleep: Fair  Appetite:  Fair  Current Medications: Current Facility-Administered Medications  Medication Dose Route Frequency Provider Last Rate Last Admin   acetaminophen (TYLENOL) tablet 650 mg  650 mg Oral Q6H PRN Janavia Rottman T, MD   650 mg at 04/01/21 0823   alum & mag hydroxide-simeth (MAALOX/MYLANTA) 200-200-20 MG/5ML suspension 30 mL  30 mL Oral Q4H PRN Averi Cacioppo T, MD       amLODipine (NORVASC) tablet 5 mg  5 mg Oral Daily Alyzza Andringa T, MD   5 mg at 04/01/21 0800   benztropine (COGENTIN) tablet 1 mg  1 mg Oral BID Kenard Morawski T, MD   1 mg at 04/01/21 0800   busPIRone (BUSPAR) tablet 15 mg  15 mg Oral BID [name], Jamison Y, NP   15 mg at 04/01/21 0800   diphenhydrAMINE (BENADRYL) capsule 100 mg  100 mg Oral Q6H PRN [name], [name], DO   100 mg at 03/31/21 2108   Or   diphenhydrAMINE (BENADRYL) injection 100 mg  100 mg Intramuscular Q6H PRN [name], [name], DO       folic acid (FOLVITE) tablet 1 mg  1 mg Oral Daily Levoy Geisen T, MD   1 mg at 04/01/21 0800   haloperidol (  HALDOL) tablet 10 mg  10 mg Oral Q6H PRN Sarina Ill, DO   10 mg at 03/29/21 2258   Or   haloperidol lactate (HALDOL) injection 10 mg  10 mg Intramuscular Q6H PRN Sarina Ill, DO       hydrOXYzine (ATARAX) tablet 25 mg  25 mg Oral TID PRN Rashid Whitenight, Jackquline Denmark, MD   25 mg at 04/01/21 1352   lithium carbonate (ESKALITH) CR tablet 900 mg  900 mg Oral QHS Bianca Vester T, MD   900 mg at 03/31/21 2108   LORazepam (ATIVAN) tablet 2 mg  2 mg Oral Q6H PRN Sarina Ill, DO   2 mg at 03/29/21 2258   Or   LORazepam (ATIVAN) injection 2 mg  2 mg Intramuscular Q6H PRN Sarina Ill, DO       magnesium hydroxide (MILK OF MAGNESIA) suspension 30 mL  30 mL Oral Daily PRN Marshall Kampf, Jackquline Denmark, MD       QUEtiapine (SEROQUEL) tablet 400 mg  400 mg Oral QHS Lashan Macias T, MD   400 mg at 03/31/21 2108   risperiDONE (RISPERDAL M-TABS)  disintegrating tablet 2 mg  2 mg Oral Q8H PRN Oluwatobi Ruppe, Jackquline Denmark, MD   2 mg at 03/31/21 1254   thiamine tablet 100 mg  100 mg Oral Daily Ronica Vivian, Jackquline Denmark, MD   100 mg at 04/01/21 0800    Lab Results: No results found for this or any previous visit (from the past 48 hour(s)).  Blood Alcohol level:  Lab Results  Component Value Date   ETH <10 03/18/2021   ETH <10 10/03/2020    Metabolic Disorder Labs: Lab Results  Component Value Date   HGBA1C 4.8 03/21/2021   MPG 91 03/21/2021   MPG 91.06 12/03/2019   Lab Results  Component Value Date   PROLACTIN 25.4 (H) 12/04/2019   Lab Results  Component Value Date   CHOL 174 03/21/2021   TRIG 95 03/21/2021   HDL 39 (L) 03/21/2021   CHOLHDL 4.5 03/21/2021   VLDL 19 03/21/2021   LDLCALC 116 (H) 03/21/2021   LDLCALC 62 12/03/2019    Physical Findings: AIMS:  , ,  ,  ,    CIWA:    COWS:     Musculoskeletal: Strength & Muscle Tone: within normal limits Gait & Station: normal Patient leans: N/A  Psychiatric Specialty Exam:  Presentation  General Appearance: Bizarre  Eye Contact:Minimal  Speech:Clear and Coherent  Speech Volume:Other (comment)  Handedness:Right   Mood and Affect  Mood:Anxious; Dysphoric; Irritable  Affect:Flat; Blunt; Congruent   Thought Process  Thought Processes:Disorganized  Descriptions of Associations:Loose  Orientation:Full (Time, Place and Person)  Thought Content:Delusions; Paranoid Ideation; Scattered  History of Schizophrenia/Schizoaffective disorder:Yes  Duration of Psychotic Symptoms:Greater than six months  Hallucinations:No data recorded Ideas of Reference:Delusions; Paranoia  Suicidal Thoughts:No data recorded Homicidal Thoughts:No data recorded  Sensorium  Memory:Immediate Fair; Recent Fair; Remote Fair  Judgment:Poor  Insight:Poor   Executive Functions  Concentration:Fair  Attention Span:Poor  Recall:Fair  Fund of  Knowledge:Fair  Language:Fair   Psychomotor Activity  Psychomotor Activity:No data recorded  Assets  Assets:Communication Skills; Desire for Improvement   Sleep  Sleep:No data recorded   Physical Exam: Physical Exam Vitals and nursing note reviewed.  Constitutional:      Appearance: Normal appearance.  HENT:     Head: Normocephalic and atraumatic.     Mouth/Throat:     Pharynx: Oropharynx is clear.  Eyes:     Pupils:  Pupils are equal, round, and reactive to light.  Cardiovascular:     Rate and Rhythm: Normal rate and regular rhythm.  Pulmonary:     Effort: Pulmonary effort is normal.     Breath sounds: Normal breath sounds.  Abdominal:     General: Abdomen is flat.     Palpations: Abdomen is soft.  Musculoskeletal:        General: Normal range of motion.  Skin:    General: Skin is warm and dry.  Neurological:     General: No focal deficit present.     Mental Status: He is alert. Mental status is at baseline.  Psychiatric:        Attention and Perception: He is inattentive.        Mood and Affect: Mood normal. Affect is blunt.        Speech: Speech is delayed.        Thought Content: Thought content normal.   Review of Systems  Constitutional: Negative.   HENT: Negative.    Eyes: Negative.   Respiratory: Negative.    Cardiovascular: Negative.   Gastrointestinal: Negative.   Musculoskeletal: Negative.   Skin: Negative.   Neurological: Negative.   Psychiatric/Behavioral: Negative.    Blood pressure 124/79, pulse 85, temperature 97.8 F (36.6 C), temperature source Oral, resp. rate 17, height 6' (1.829 m), weight 81.6 kg, SpO2 96 %. Body mass index is 24.41 kg/m.   Treatment Plan Summary: Medication management and Plan currently on Seroquel 400 mg at night and lithium 900 mg at night with good tolerance.  Patient appears to be at his baseline unlikely to improve further with hospitalization at this level.  Patient has been counseled about the importance of  staying compliant with medicine and involved with outpatient treatment to which she agrees.  Supply of medicine will be provided as well as prescriptions for discharge tomorrow  Mordecai Rasmussen, MD 04/01/2021, 2:57 PM

## 2021-04-01 NOTE — Group Note (Signed)
LCSW Group Therapy Note  Group Date: 04/01/2021 Start Time: 1300 End Time: 1400   Type of Therapy and Topic:  Group Therapy - How To Cope with Nervousness about Discharge   Participation Level:  Did Not Attend   Description of Group This process group involved identification of patients' feelings about discharge. Some of them are scheduled to be discharged soon, while others are new admissions, but each of them was asked to share thoughts and feelings surrounding discharge from the hospital. One common theme was that they are excited at the prospect of going home, while another was that many of them are apprehensive about sharing why they were hospitalized. Patients were given the opportunity to discuss these feelings with their peers in preparation for discharge.  Therapeutic Goals  Patient will identify their overall feelings about pending discharge. Patient will think about how they might proactively address issues that they believe will once again arise once they get home (i.e. with parents). Patients will participate in discussion about having hope for change.   Summary of Patient Progress:  Patient did not attend group despite encouraged participation.    Therapeutic Modalities Cognitive Behavioral Therapy   Almedia Balls 04/01/2021  1:42 PM

## 2021-04-01 NOTE — Progress Notes (Signed)
Pt observed in the milieu pacing for an extended amount of time. Pt does not engage with peers or staff. Pt cooperative and took his hs meds. Pt denies SI/HI/AVH.  Pt went to bed early, and later emerged in the middle of the night asking for a change of clothes. Pt given new scrubs. Q15 mins safety checks maintained.

## 2021-04-02 NOTE — Plan of Care (Signed)
Pt denies SI / HI / AVH. Pt reports L foor pain 6/10 from "walking with slides on." Patient denies any distress or acute injury. Pt is discharge focused. Adherent with scheduled medications. Staff will continue to monitor.    Problem: Education: Goal: Knowledge of [facility] Education information/materials will improve Outcome: Progressing Goal: Emotional status will improve Outcome: Progressing Goal: Mental status will improve Outcome: Progressing Goal: Verbalization of understanding the information provided will improve Outcome: Progressing

## 2021-04-02 NOTE — Discharge Summary (Signed)
Physician Discharge Summary Note  Patient:  Nathan Barnett. is an 31 y.o., male MRN:  [id number] DOB:  1991-02-15 Patient phone:  [phone] (home)  Patient address:   [address] [place] [address],  Total Time spent with patient: 30 minutes  Date of Admission:  03/20/2021 Date of Discharge: 04/02/2021  Reason for Admission: Admitted because of ongoing psychotic symptoms with paranoia and disorganized behavior poor self-care  Principal Problem: Schizophrenia [facility]) Discharge Diagnoses: Principal Problem:   Schizophrenia (HCC)   Past Psychiatric History: Past history of schizophrenia has several prior presentations and admissions  Past Medical History:  Past Medical History:  Diagnosis Date   Mental health problem    History reviewed. No pertinent surgical history. Family History: History reviewed. No pertinent family history. Family Psychiatric  History: See previous Social History:  Social History   Substance and Sexual Activity  Alcohol Use Yes     Social History   Substance and Sexual Activity  Drug Use Not Currently    Social History   Socioeconomic History   Marital status: Single    Spouse name: Not on file   Number of children: Not on file   Years of education: Not on file   Highest education level: Not on file  Occupational History   Not on file  Tobacco Use   Smoking status: Every Day    Packs/day: 2.00    Years: 2.00    Pack years: 4.00    Types: Cigarettes   Smokeless tobacco: Never  Vaping Use   Vaping Use: Never used  Substance and Sexual Activity   Alcohol use: Yes   Drug use: Not Currently   Sexual activity: Not Currently  Other Topics Concern   Not on file  Social History Narrative   Not on file   Social Determinants of Health   Financial Resource Strain: Not on file  Food Insecurity: Not on file  Transportation Needs: Not on file  Physical Activity: Not on file  Stress: Not on file  Social Connections: Not on file     Hospital Course: Patient was cooperative with medication.  Only attended groups sporadically.  Mostly kept to himself.  Paced a lot but was able to sit still at times as well and did not necessarily appear to have akathisia.  Medication by the way was switched to Seroquel specifically to try to minimize risk of akathisia which seemed to have been a problem in the past.  Multiple attempts made to try and form some rapport but patient was consistently standoffish however he denied suicidal ideation or homicidal ideation and denied ongoing psychotic symptoms and states that he agrees to follow up with outpatient care with [facility].  Patient will be given a supply of medicines and prescriptions at discharge.  Blood pressure was also treated and he will be given prescription for ongoing medication for that  Physical Findings: AIMS:  , ,  ,  ,    CIWA:    COWS:     Musculoskeletal: Strength & Muscle Tone: within normal limits Gait & Station: normal Patient leans: N/A   Psychiatric Specialty Exam:  Presentation  General Appearance: Bizarre  Eye Contact:Minimal  Speech:Clear and Coherent  Speech Volume:Other (comment)  Handedness:Right   Mood and Affect  Mood:Anxious; Dysphoric; Irritable  Affect:Flat; Blunt; Congruent   Thought Process  Thought Processes:Disorganized  Descriptions of Associations:Loose  Orientation:Full (Time, Place and Person)  Thought Content:Delusions; Paranoid Ideation; Scattered  History of Schizophrenia/Schizoaffective disorder:Yes  Duration of Psychotic Symptoms:Greater than six months  Hallucinations:No data recorded Ideas of Reference:Delusions; Paranoia  Suicidal Thoughts:No data recorded Homicidal Thoughts:No data recorded  Sensorium  Memory:Immediate Fair; Recent Fair; Remote Fair  Judgment:Poor  Insight:Poor   Executive Functions  Concentration:Fair  Attention Span:Poor  Recall:Fair  Fund of  Knowledge:Fair  Language:Fair   Psychomotor Activity  Psychomotor Activity:No data recorded  Assets  Assets:Communication Skills; Desire for Improvement   Sleep  Sleep:No data recorded   Physical Exam: Physical Exam Vitals and nursing note reviewed.  Constitutional:      Appearance: Normal appearance.  HENT:     Head: Normocephalic and atraumatic.     Mouth/Throat:     Pharynx: Oropharynx is clear.  Eyes:     Pupils: Pupils are equal, round, and reactive to light.  Cardiovascular:     Rate and Rhythm: Normal rate and regular rhythm.  Pulmonary:     Effort: Pulmonary effort is normal.     Breath sounds: Normal breath sounds.  Abdominal:     General: Abdomen is flat.     Palpations: Abdomen is soft.  Musculoskeletal:        General: Normal range of motion.  Skin:    General: Skin is warm and dry.  Neurological:     General: No focal deficit present.     Mental Status: He is alert. Mental status is at baseline.  Psychiatric:        Mood and Affect: Mood normal.        Thought Content: Thought content normal.   Review of Systems  Constitutional: Negative.   HENT: Negative.    Eyes: Negative.   Respiratory: Negative.    Cardiovascular: Negative.   Gastrointestinal: Negative.   Musculoskeletal: Negative.   Skin: Negative.   Neurological: Negative.   Psychiatric/Behavioral: Negative.    Blood pressure 118/72, pulse 83, temperature (!) 97.5 F (36.4 C), temperature source Oral, resp. rate 17, height 6' (1.829 m), weight 81.6 kg, SpO2 97 %. Body mass index is 24.41 kg/m.   Social History   Tobacco Use  Smoking Status Every Day   Packs/day: 2.00   Years: 2.00   Pack years: 4.00   Types: Cigarettes  Smokeless Tobacco Never   Tobacco Cessation:  A prescription for an FDA-approved tobacco cessation medication provided at discharge   Blood Alcohol level:  Lab Results  Component Value Date   ETH <10 03/18/2021   ETH <10 10/03/2020    Metabolic  Disorder Labs:  Lab Results  Component Value Date   HGBA1C 4.8 03/21/2021   MPG 91 03/21/2021   MPG 91.06 12/03/2019   Lab Results  Component Value Date   PROLACTIN 25.4 (H) 12/04/2019   Lab Results  Component Value Date   CHOL 174 03/21/2021   TRIG 95 03/21/2021   HDL 39 (L) 03/21/2021   CHOLHDL 4.5 03/21/2021   VLDL 19 03/21/2021   LDLCALC 116 (H) 03/21/2021   LDLCALC 62 12/03/2019    See Psychiatric Specialty Exam and Suicide Risk Assessment completed by Attending Physician prior to discharge.  Discharge destination:  Home  Is patient on multiple antipsychotic therapies at discharge:  No   Has Patient had three or more failed trials of antipsychotic monotherapy by history:  No  Recommended Plan for Multiple Antipsychotic Therapies: NA  Discharge Instructions     Diet - low sodium heart healthy   Complete by: As directed    Increase activity slowly   Complete by: As directed  Allergies as of 04/02/2021   No Known Allergies      Medication List     STOP taking these medications    ARIPiprazole 10 MG tablet Commonly known as: ABILIFY   ARIPiprazole 2 MG tablet Commonly known as: ABILIFY   folic acid 1 MG tablet Commonly known as: FOLVITE   hydrOXYzine 25 MG capsule Commonly known as: VISTARIL   hydrOXYzine 25 MG tablet Commonly known as: ATARAX   mirtazapine 15 MG tablet Commonly known as: REMERON   traZODone 50 MG tablet Commonly known as: DESYREL   ziprasidone 20 MG capsule Commonly known as: GEODON       TAKE these medications      Indication  amLODipine 5 MG tablet Commonly known as: NORVASC Take 1 tablet (5 mg total) by mouth daily.  Indication: High Blood Pressure Disorder   benztropine 0.5 MG tablet Commonly known as: COGENTIN Take 2 tablets (1 mg) by mouth 2 (two) times daily. What changed: medication strength  Indication: Extrapyramidal Reaction caused by Medications   busPIRone 15 MG tablet Commonly known as:  BUSPAR Take 1 tablet (15 mg total) by mouth 2 (two) times daily.  Indication: Anxiety Disorder   lithium carbonate 450 MG CR tablet Commonly known as: ESKALITH Take 2 tablets (900 mg total) by mouth at bedtime. What changed:  medication strength how much to take Another medication with the same name was removed. Continue taking this medication, and follow the directions you see here.  Indication: Schizoaffective Disorder   QUEtiapine 400 MG tablet Commonly known as: SEROQUEL Take 1 tablet (400 mg total) by mouth at bedtime.  Indication: Schizophrenia   thiamine 100 MG tablet Take 1 tablet (100 mg total) by mouth daily.  Indication: Deficiency of Vitamin B1, Supplementation        Follow-up Information     [facility]. Call.   Why: Pleaes call to schedule a hospital followup appointment. Bring any hospital discharge paperwork with you to your appointment. Contact information: Alyse Low, NP [address]  [place], [place] [address] [phone]                Follow-up recommendations: Follow up with [facility]  Comments: Prescriptions provided  Signed: Mordecai Rasmussen, MD 04/02/2021, 10:21 AM

## 2021-04-02 NOTE — Plan of Care (Signed)
PT discharge

## 2021-04-02 NOTE — BH IP Treatment Plan (Signed)
Interdisciplinary Treatment and Diagnostic Plan Update  04/02/2021 Time of Session: Nathan Barnett. MRN: [id number]  Principal Diagnosis: Schizophrenia ([place])  Secondary Diagnoses: Principal Problem:   Schizophrenia ([place])   Current Medications:  Current Facility-Administered Medications  Medication Dose Route Frequency Provider Last Rate Last Admin   acetaminophen (TYLENOL) tablet 650 mg  650 mg Oral Q6H PRN Clapacs, [name], MD   650 mg at 04/02/21 0733   alum & mag hydroxide-simeth (MAALOX/MYLANTA) 200-200-20 MG/5ML suspension 30 mL  30 mL Oral Q4H PRN Clapacs, [name], MD       amLODipine (NORVASC) tablet 5 mg  5 mg Oral Daily Clapacs, [name], MD   5 mg at 04/02/21 [id number]   benztropine (COGENTIN) tablet 1 mg  1 mg Oral BID Clapacs, John T, MD   1 mg at 04/02/21 0733   busPIRone (BUSPAR) tablet 15 mg  15 mg Oral BID Patrecia Pour, NP   15 mg at 04/02/21 [id number]   diphenhydrAMINE (BENADRYL) capsule 100 mg  100 mg Oral Q6H PRN [name], DO   100 mg at 04/01/21 2032   Or   diphenhydrAMINE (BENADRYL) injection 100 mg  100 mg Intramuscular Q6H PRN [name], DO       folic acid (FOLVITE) tablet 1 mg  1 mg Oral Daily Clapacs, [name], MD   1 mg at 04/02/21 [id number]   haloperidol (HALDOL) tablet 10 mg  10 mg Oral Q6H PRN [name], DO   10 mg at 03/29/21 2258   Or   haloperidol lactate (HALDOL) injection 10 mg  10 mg Intramuscular Q6H PRN [name], DO       hydrOXYzine (ATARAX) tablet 25 mg  25 mg Oral TID PRN Clapacs, John T, MD   25 mg at 04/01/21 1352   lithium carbonate (ESKALITH) CR tablet 900 mg  900 mg Oral QHS Clapacs, John T, MD   900 mg at 04/01/21 2033   LORazepam (ATIVAN) tablet 2 mg  2 mg Oral Q6H PRN [name], DO   2 mg at 03/29/21 2258   Or   LORazepam (ATIVAN) injection 2 mg  2 mg Intramuscular Q6H PRN [name], DO       magnesium hydroxide (MILK OF MAGNESIA) suspension 30 mL  30 mL Oral  Daily PRN Clapacs, John T, MD       QUEtiapine (SEROQUEL) tablet 400 mg  400 mg Oral QHS Clapacs, John T, MD   400 mg at 04/01/21 2033   risperiDONE (RISPERDAL M-TABS) disintegrating tablet 2 mg  2 mg Oral Q8H PRN Clapacs, John T, MD   2 mg at 03/31/21 1254   thiamine tablet 100 mg  100 mg Oral Daily Clapacs, [name], MD   100 mg at 04/02/21 [id number]   PTA Medications: Medications Prior to Admission  Medication Sig Dispense Refill Last Dose   ARIPiprazole (ABILIFY) 10 MG tablet Take 10 mg by mouth daily. Pt takes with 2 mg to equal 12 mg for a total dose (Patient not taking: Reported on 03/19/2021)      ARIPiprazole (ABILIFY) 2 MG tablet Take 2 mg by mouth daily. Pt takes with 10 mg for a total dose of 12 mg. (Patient not taking: Reported on [id number])      folic acid (FOLVITE) 1 MG tablet Take 1 tablet (1 mg total) by mouth daily. (Patient not taking: Reported on 03/19/2021) 30 tablet 0    hydrOXYzine (ATARAX/VISTARIL) 25 MG  tablet Take 1 tablet (25 mg total) by mouth 3 (three) times daily as needed for anxiety. 30 tablet 0    hydrOXYzine (VISTARIL) 25 MG capsule Take 25 mg by mouth 3 (three) times daily as needed.      lithium carbonate (ESKALITH) 450 MG CR tablet Take 1 tablet (450 mg total) by mouth at bedtime. (Patient not taking: Reported on 03/19/2021) 30 tablet 0    lithium carbonate (LITHOBID) 300 MG CR tablet Take 600 mg by mouth at bedtime.      mirtazapine (REMERON) 15 MG tablet Take 15 mg by mouth at bedtime.      traZODone (DESYREL) 50 MG tablet Take 1 tablet (50 mg total) by mouth at bedtime as needed and may repeat dose one time if needed for sleep. (Patient not taking: Reported on 10/03/2020) 30 tablet 0    ziprasidone (GEODON) 20 MG capsule Take 20 mg by mouth 2 (two) times daily with a meal. Take 20mg  by mouth in the morning, and 40mg  by mouth in the evening. Take with food      [DISCONTINUED] benztropine (COGENTIN) 1 MG tablet Take 1 mg by mouth 2 (two) times daily.      [DISCONTINUED]  busPIRone (BUSPAR) 15 MG tablet Take 15 mg by mouth 2 (two) times daily.      [DISCONTINUED] thiamine 100 MG tablet Take 1 tablet (100 mg total) by mouth daily. (Patient not taking: Reported on 03/19/2021) 30 tablet 0     Patient Stressors: Marital or family conflict   Other: Medication ineffectiveness    Patient Strengths: Armed forces logistics/support/administrative officer  Physical Health  Supportive family/friends   Treatment Modalities: Medication Management, Group therapy, Case management,  1 to 1 session with clinician, Psychoeducation, Recreational therapy.   Physician Treatment Plan for Primary Diagnosis: Schizophrenia ([place]) Long Term Goal(s): Improvement in symptoms so as ready for discharge   Short Term Goals: Ability to identify changes in lifestyle to reduce recurrence of condition will improve Ability to verbalize feelings will improve Ability to disclose and discuss suicidal ideas Ability to demonstrate self-control will improve Ability to identify and develop effective coping behaviors will improve Ability to maintain clinical measurements within normal limits will improve Compliance with prescribed medications will improve Ability to identify triggers associated with substance abuse/mental health issues will improve  Medication Management: Evaluate patient's response, side effects, and tolerance of medication regimen.  Therapeutic Interventions: 1 to 1 sessions, Unit Group sessions and Medication administration.  Evaluation of Outcomes: Adequate for Discharge  Physician Treatment Plan for Secondary Diagnosis: Principal Problem:   Schizophrenia ([place])  Long Term Goal(s): Improvement in symptoms so as ready for discharge   Short Term Goals: Ability to identify changes in lifestyle to reduce recurrence of condition will improve Ability to verbalize feelings will improve Ability to disclose and discuss suicidal ideas Ability to demonstrate self-control will improve Ability to identify and  develop effective coping behaviors will improve Ability to maintain clinical measurements within normal limits will improve Compliance with prescribed medications will improve Ability to identify triggers associated with substance abuse/mental health issues will improve     Medication Management: Evaluate patient's response, side effects, and tolerance of medication regimen.  Therapeutic Interventions: 1 to 1 sessions, Unit Group sessions and Medication administration.  Evaluation of Outcomes: Adequate for Discharge   RN Treatment Plan for Primary Diagnosis: Schizophrenia [facility]) Long Term Goal(s): Knowledge of disease and therapeutic regimen to maintain health will improve  Short Term Goals: Ability to remain free from injury will  improve, Ability to verbalize frustration and anger appropriately will improve, Ability to demonstrate self-control, Ability to participate in decision making will improve, Ability to verbalize feelings will improve, Ability to disclose and discuss suicidal ideas, Ability to identify and develop effective coping behaviors will improve, and Compliance with prescribed medications will improve  Medication Management: RN will administer medications as ordered by provider, will assess and evaluate patient's response and provide education to patient for prescribed medication. RN will report any adverse and/or side effects to prescribing provider.  Therapeutic Interventions: 1 on 1 counseling sessions, Psychoeducation, Medication administration, Evaluate responses to treatment, Monitor vital signs and CBGs as ordered, Perform/monitor CIWA, COWS, AIMS and Fall Risk screenings as ordered, Perform wound care treatments as ordered.  Evaluation of Outcomes: Adequate for Discharge   LCSW Treatment Plan for Primary Diagnosis: Schizophrenia [facility]) Long Term Goal(s): Safe transition to appropriate next level of care at discharge, Engage patient in therapeutic group addressing  interpersonal concerns.  Short Term Goals: Engage patient in aftercare planning with referrals and resources, Increase social support, Increase ability to appropriately verbalize feelings, Increase emotional regulation, Facilitate acceptance of mental health diagnosis and concerns, Facilitate patient progression through stages of change regarding substance use diagnoses and concerns, Identify triggers associated with mental health/substance abuse issues, and Increase skills for wellness and recovery  Therapeutic Interventions: Assess for all discharge needs, 1 to 1 time with Social worker, Explore available resources and support systems, Assess for adequacy in community support network, Educate family and significant other(s) on suicide prevention, Complete Psychosocial Assessment, Interpersonal group therapy.  Evaluation of Outcomes: Adequate for Discharge   Progress in Treatment: Attending groups: No. Participating in groups: No. Taking medication as prescribed: Yes. Toleration medication: Yes. Family/Significant other contact made: Yes, individual(s) contacted:  SPE completed with Tempie Donning, Mother Patient understands diagnosis: No. Discussing patient identified problems/goals with staff: Yes. Medical problems stabilized or resolved: Yes. Denies suicidal/homicidal ideation: Yes. Issues/concerns per patient self-inventory: Yes. Other: none  New problem(s) identified: No, Describe:  No additional problems identified at this time.   New Short Term/Long Term Goal(s): Patient to continue working towards  elimination of symptoms of psychosis, medication management for mood stabilization; development of comprehensive mental wellness plan.  Patient Goals:  No additional goals identified at this time.  Discharge Plan or Barriers: No barriers identified at this time. Patient is expected to discharge to the mother's home on this day (04/02/21)  Scribe for Treatment Team: Durenda Hurt,  Latanya Presser 04/02/2021 9:04 AM

## 2021-04-02 NOTE — BHH Suicide Risk Assessment (Signed)
[facility] Discharge Suicide Risk Assessment   Principal Problem: Schizophrenia [facility]) Discharge Diagnoses: Principal Problem:   Schizophrenia (HCC)   Total Time spent with patient: 30 minutes  Musculoskeletal: Strength & Muscle Tone: within normal limits Gait & Station: normal Patient leans: N/A  Psychiatric Specialty Exam  Presentation  General Appearance: Bizarre  Eye Contact:Minimal  Speech:Clear and Coherent  Speech Volume:Other (comment)  Handedness:Right   Mood and Affect  Mood:Anxious; Dysphoric; Irritable  Duration of Depression Symptoms: Greater than two weeks  Affect:Flat; Blunt; Congruent   Thought Process  Thought Processes:Disorganized  Descriptions of Associations:Loose  Orientation:Full (Time, Place and Person)  Thought Content:Delusions; Paranoid Ideation; Scattered  History of Schizophrenia/Schizoaffective disorder:Yes  Duration of Psychotic Symptoms:Greater than six months  Hallucinations:No data recorded Ideas of Reference:Delusions; Paranoia  Suicidal Thoughts:No data recorded Homicidal Thoughts:No data recorded  Sensorium  Memory:Immediate Fair; Recent Fair; Remote Fair  Judgment:Poor  Insight:Poor   Executive Functions  Concentration:Fair  Attention Span:Poor  Recall:Fair  Fund of Knowledge:Fair  Language:Fair   Psychomotor Activity  Psychomotor Activity:No data recorded  Assets  Assets:Communication Skills; Desire for Improvement   Sleep  Sleep:No data recorded  Physical Exam: Physical Exam Vitals and nursing note reviewed.  Constitutional:      Appearance: Normal appearance.  HENT:     Head: Normocephalic and atraumatic.     Mouth/Throat:     Pharynx: Oropharynx is clear.  Eyes:     Pupils: Pupils are equal, round, and reactive to light.  Cardiovascular:     Rate and Rhythm: Normal rate and regular rhythm.  Pulmonary:     Effort: Pulmonary effort is normal.     Breath sounds: Normal breath sounds.   Abdominal:     General: Abdomen is flat.     Palpations: Abdomen is soft.  Musculoskeletal:        General: Normal range of motion.  Skin:    General: Skin is warm and dry.  Neurological:     General: No focal deficit present.     Mental Status: He is alert. Mental status is at baseline.  Psychiatric:        Mood and Affect: Mood normal.        Thought Content: Thought content normal.   Review of Systems  Constitutional: Negative.   HENT: Negative.    Eyes: Negative.   Respiratory: Negative.    Cardiovascular: Negative.   Gastrointestinal: Negative.   Musculoskeletal: Negative.   Skin: Negative.   Neurological: Negative.   Psychiatric/Behavioral: Negative.    Blood pressure 118/72, pulse 83, temperature (!) 97.5 F (36.4 C), temperature source Oral, resp. rate 17, height 6' (1.829 m), weight 81.6 kg, SpO2 97 %. Body mass index is 24.41 kg/m.  Mental Status Per Nursing Assessment::   On Admission:   (none of the above)  Demographic Factors:  Male and Unemployed  Loss Factors: Financial problems/change in socioeconomic status  Historical Factors: Impulsivity  Risk Reduction Factors:   Positive social support and Positive therapeutic relationship  Continued Clinical Symptoms:  Schizophrenia:   Paranoid or undifferentiated type  Cognitive Features That Contribute To Risk:  None    Suicide Risk:  Minimal: No identifiable suicidal ideation.  Patients presenting with no risk factors but with morbid ruminations; may be classified as minimal risk based on the severity of the depressive symptoms   Follow-up Information     [facility]. Call.   Why: Pleaes call to schedule a hospital followup appointment. Bring any hospital discharge paperwork with you to  your appointment. Contact information: Alyse Low, NP [address]  [place], [place] [address] [phone]                Plan Of Care/Follow-up recommendations:  Patient is  denying any suicidal ideation and has positive plans for the future.  He is not showing any acutely dangerous behavior.  He has been compliant with medicine and agrees to outpatient follow-up treatment and will be discharged today on current medicine.  Mordecai Rasmussen, MD 04/02/2021, 10:19 AM

## 2021-04-02 NOTE — Progress Notes (Signed)
Recreation Therapy Notes   Date: 04/02/2021  Time: 10:30am    Location: Craft room    Behavioral response: N/A   Intervention Topic: Social Skills    Discussion/Intervention: Patient refused to attend group.   Clinical Observations/Feedback:  Patient refused to attend group.   Poetry Cerro LRT/CTRS         Quintara Bost 04/02/2021 11:32 AM

## 2021-04-02 NOTE — Progress Notes (Signed)
Pt up x 2 pacing the halls and then his room.

## 2021-04-02 NOTE — Progress Notes (Signed)
°  [facility] Adult Case Management Discharge Plan :  Will you be returning to the same living situation after discharge:  Yes,  Patient to return to mother's residence as listed in [place]. At discharge, do you have transportation home?: Yes,  Patient's mother to assist with transportation at discharge.  Do you have the ability to pay for your medications: No. No insurance noted. Patient provided with 7-day supply.   Release of information consent forms completed and in the chart;  Patient's signature needed at discharge.  Patient to Follow up at:  Follow-up Information     [facility]. Call.   Why: Pleaes call to schedule a hospital followup appointment. Bring any hospital discharge paperwork with you to your appointment. Contact information: Alyse Low, NP [address]  [place], [place] [address] [phone]               Next level of care provider has access to [facility] Link:no  Safety Planning and Suicide Prevention discussed: Yes,  SPE completed with patient and Keturah Barre, Mother.    Has patient been referred to the Quitline?: Patient refused referral  Patient has been referred for addiction treatment: Pt. refused referral  Corky Crafts, LCSWA 04/02/2021, 10:22 AM

## 2021-04-02 NOTE — Progress Notes (Signed)
Pt visible on the unit, pacing the halls, but no interaction with peers or staff. He stated he was going home tomorrow. He denies SI/HI and AVH. He is very quiet, guarded and doesn't dialogue unless he needs something. He is cooperative and med complaints.

## 2022-03-23 ENCOUNTER — Ambulatory Visit
Admission: RE | Admit: 2022-03-23 | Discharge: 2022-03-23 | Disposition: A | Discharge status: Home / Self Care | Payer: Medicaid Other | Source: Ambulatory Visit

## 2022-03-23 VITALS — BP 135/86 | HR 102 | Temp 97.9°F | Resp 18

## 2022-03-23 DIAGNOSIS — R300 Dysuria: Secondary | ICD-10-CM | POA: Diagnosis not present

## 2022-03-23 DIAGNOSIS — N3001 Acute cystitis with hematuria: Secondary | ICD-10-CM

## 2022-03-23 LAB — POCT URINALYSIS DIP (MANUAL ENTRY)
Bilirubin, UA: NEGATIVE
Glucose, UA: NEGATIVE mg/dL
Ketones, POC UA: NEGATIVE mg/dL
Nitrite, UA: NEGATIVE
Protein Ur, POC: NEGATIVE mg/dL
Spec Grav, UA: 1.015 (ref 1.010–1.025)
Urobilinogen, UA: 0.2 E.U./dL
pH, UA: 7 (ref 5.0–8.0)

## 2022-03-23 MED ORDER — NITROFURANTOIN MONOHYD MACRO 100 MG PO CAPS: 100.0000 mg | ORAL_CAPSULE | Freq: Two times a day (BID) | ORAL | 0 refills | Status: AC

## 2022-03-23 NOTE — ED Triage Notes (Signed)
Pt. Presents to UC w/ c/o dysuria and sensitivity to the tip of his penis since Monday.

## 2022-03-23 NOTE — Discharge Instructions (Signed)
Follow up here or with your primary care provider if your symptoms are worsening or not improving with treatment.     

## 2022-03-23 NOTE — ED Provider Notes (Signed)
Nathan Barnett    CSN: [id number] Arrival date & time: 03/23/22  1855      History   Chief Complaint Chief Complaint  Patient presents with   Dysuria    HPI Nathan Barnett. is a 32 y.o. male.    Dysuria Presenting symptoms: dysuria     Patient presents to urgent care with complaint of painful urination as well as sensitivity to the tip of his penis since Monday.  He denies being sexually active or otherwise at risk for STD.  Past Medical History:  Diagnosis Date   Mental health problem     Patient Active Problem List   Diagnosis Date Noted   Schizophrenia ([place]) 03/20/2021   Psychosis ([place]) 12/04/2019   Schizoaffective disorder, bipolar type ([place])     History reviewed. No pertinent surgical history.     Home Medications    Prior to Admission medications   Medication Sig Start Date End Date Taking? Authorizing Provider  paliperidone (INVEGA) 6 MG 24 hr tablet Take 6 mg by mouth daily. 03/11/22  Yes [provider]  traZODone (DESYREL) 50 MG tablet Take 50 mg by mouth at bedtime as needed. 03/11/22  Yes [provider]  amLODipine (NORVASC) 5 MG tablet Take 1 tablet (5 mg total) by mouth daily. 04/01/21   Clapacs, Nathan Reno, MD  benztropine (COGENTIN) 0.5 MG tablet Take 2 tablets (1 mg) by mouth 2 (two) times daily. 04/01/21   Clapacs, Nathan Reno, MD  busPIRone (BUSPAR) 15 MG tablet Take 1 tablet (15 mg total) by mouth 2 (two) times daily. 04/01/21   Clapacs, Nathan Reno, MD  lithium carbonate (ESKALITH) 450 MG CR tablet Take 2 tablets (900 mg total) by mouth at bedtime. 04/01/21   Clapacs, Nathan Reno, MD  QUEtiapine (SEROQUEL) 400 MG tablet Take 1 tablet (400 mg total) by mouth at bedtime. 04/01/21   Clapacs, Nathan Reno, MD  thiamine 100 MG tablet Take 1 tablet (100 mg total) by mouth daily. 04/01/21   Clapacs, Nathan Reno, MD    Family History History reviewed. No pertinent family history.  Social History Social History   Tobacco Use   Smoking status: Every  Day    Packs/day: 2.00    Years: 2.00    Total pack years: 4.00    Types: Cigarettes   Smokeless tobacco: Never  Vaping Use   Vaping Use: Never used  Substance Use Topics   Alcohol use: Yes   Drug use: Not Currently     Allergies   Patient has no known allergies.   Review of Systems Review of Systems  Genitourinary:  Positive for dysuria.     Physical Exam Triage Vital Signs ED Triage Vitals  Enc Vitals Group     BP 03/23/22 1917 135/86     Pulse Rate 03/23/22 1917 (!) 102     Resp 03/23/22 1917 18     Temp 03/23/22 1917 97.9 F (36.6 C)     Temp Source 03/23/22 1917 Temporal     SpO2 03/23/22 1917 95 %     Weight --      Height --      Head Circumference --      Peak Flow --      Pain Score 03/23/22 1916 0     Pain Loc --      Pain Edu? --      Excl. in [place]? --    No data found.  Updated Vital Signs BP 135/86 (BP  Location: Right Arm)   Pulse (!) 102   Temp 97.9 F (36.6 C) (Temporal)   Resp 18   SpO2 95%   Visual Acuity Right Eye Distance:   Left Eye Distance:   Bilateral Distance:    Right Eye Near:   Left Eye Near:    Bilateral Near:     Physical Exam Vitals reviewed.  Constitutional:      Appearance: Normal appearance.  Skin:    General: Skin is warm and dry.  Neurological:     General: No focal deficit present.     Mental Status: He is alert and oriented to person, place, and time.  Psychiatric:        Mood and Affect: Mood normal.        Behavior: Behavior normal.      UC Treatments / Results  Labs (all labs ordered are listed, but only abnormal results are displayed) Labs Reviewed - No data to display  EKG   Radiology No results found.  Procedures Procedures (including critical care time)  Medications Ordered in UC Medications - No data to display  Initial Impression / Assessment and Plan / UC Course  I have reviewed the triage vital signs and the nursing notes.  Pertinent labs & imaging results that were  available during my care of the patient were reviewed by me and considered in my medical decision making (see chart for details).   UA indicates leukocytes and small blood.  Will treat for acute cystitis with hematuria with Macrobid.   Final Clinical Impressions(s) / UC Diagnoses   Final diagnoses:  None   Discharge Instructions   None    ED Prescriptions   None    PDMP not reviewed this encounter.   Nathan Barnett, [name] 03/23/22 1950

## 2022-03-28 ENCOUNTER — Ambulatory Visit
Admission: RE | Admit: 2022-03-28 | Discharge: 2022-03-28 | Disposition: A | Discharge status: Home / Self Care | Payer: Medicaid Other | Source: Ambulatory Visit | Attending: Emergency Medicine | Admitting: Emergency Medicine

## 2022-03-28 VITALS — BP 129/85 | HR 102 | Temp 99.2°F | Resp 20

## 2022-03-28 DIAGNOSIS — J069 Acute upper respiratory infection, unspecified: Secondary | ICD-10-CM | POA: Diagnosis not present

## 2022-03-28 MED ORDER — BENZONATATE 100 MG PO CAPS: 100.0000 mg | ORAL_CAPSULE | Freq: Three times a day (TID) | ORAL | 0 refills | Status: DC

## 2022-03-28 NOTE — ED Provider Notes (Signed)
[name]    CSN: [id number] Arrival date & time: 03/28/22  1750      History   Chief Complaint Chief Complaint  Patient presents with   Cough    HPI Nathan Barnett. is a 32 y.o. male.  Patient reports he has been coughing and congested for 3 days, he is concerned he could have bronchitis.  He has been using over-the-counter cold medicines but he is not sure what it is.  Last dose was approximately 5 hours ago.  He was seen in this urgent care 03/23/2022 for UTI symptoms.  He reports that those symptoms have completely resolved and he feels well from a urinary tract standpoint.  Denies fever or chills.  Denies productive cough.  Denies nasal discharge.  Does report postnasal drainage.  After initially stating he thinks he has bronchitis, change his mind and states he thinks he may have a cold.   Cough   Past Medical History:  Diagnosis Date   Mental health problem     Patient Active Problem List   Diagnosis Date Noted   Schizophrenia ([place]) 03/20/2021   Psychosis ([place]) 12/04/2019   Schizoaffective disorder, bipolar type ([place])     No past surgical history on file.     Home Medications    Prior to Admission medications   Medication Sig Start Date End Date Taking? Authorizing Provider  benzonatate (TESSALON) 100 MG capsule Take 1 capsule (100 mg total) by mouth every 8 (eight) hours. 03/28/22  Yes Carvel Getting, NP  amLODipine (NORVASC) 5 MG tablet Take 1 tablet (5 mg total) by mouth daily. Patient not taking: Reported on 03/28/2022 04/01/21   Clapacs, [name], MD  benztropine (COGENTIN) 0.5 MG tablet Take 2 tablets (1 mg) by mouth 2 (two) times daily. 04/01/21   Clapacs, [name], MD  busPIRone (BUSPAR) 15 MG tablet Take 1 tablet (15 mg total) by mouth 2 (two) times daily. 04/01/21   Clapacs, [name], MD  lithium carbonate (ESKALITH) 450 MG CR tablet Take 2 tablets (900 mg total) by mouth at bedtime. 04/01/21   Clapacs, [name], MD  nitrofurantoin,  macrocrystal-monohydrate, (MACROBID) 100 MG capsule Take 1 capsule (100 mg total) by mouth 2 (two) times daily for 7 days. 03/23/22 03/30/22  Immordino, Annie Main, FNP  paliperidone (INVEGA) 6 MG 24 hr tablet Take 6 mg by mouth daily. 03/11/22   [provider]  QUEtiapine (SEROQUEL) 400 MG tablet Take 1 tablet (400 mg total) by mouth at bedtime. 04/01/21   Clapacs, [name], MD  thiamine 100 MG tablet Take 1 tablet (100 mg total) by mouth daily. 04/01/21   Clapacs, [name], MD  traZODone (DESYREL) 50 MG tablet Take 50 mg by mouth at bedtime as needed. 03/11/22   [provider]    Family History No family history on file.  Social History Social History   Tobacco Use   Smoking status: Every Day    Packs/day: 0.50    Years: 2.00    Total pack years: 1.00    Types: Cigarettes   Smokeless tobacco: Never  Vaping Use   Vaping Use: Never used  Substance Use Topics   Alcohol use: Yes   Drug use: Not Currently     Allergies   Patient has no known allergies.   Review of Systems Review of Systems  Respiratory:  Positive for cough.      Physical Exam Triage Vital Signs ED Triage Vitals  Enc Vitals Group  BP 03/28/22 1812 129/85     Pulse Rate 03/28/22 1812 (!) 102     Resp 03/28/22 1812 20     Temp 03/28/22 1812 99.2 F (37.3 C)     Temp Source 03/28/22 1812 Oral     SpO2 03/28/22 1812 98 %     Weight --      Height --      Head Circumference --      Peak Flow --      Pain Score 03/28/22 1809 0     Pain Loc --      Pain Edu? --      Excl. in [place]? --    No data found.  Updated Vital Signs BP 129/85   Pulse (!) 102   Temp 99.2 F (37.3 C) (Oral)   Resp 20   SpO2 98%   Visual Acuity Right Eye Distance:   Left Eye Distance:   Bilateral Distance:    Right Eye Near:   Left Eye Near:    Bilateral Near:     Physical Exam Constitutional:      General: He is not in acute distress.    Appearance: Normal appearance. He is not ill-appearing.  HENT:      Right Ear: Tympanic membrane, ear canal and external ear normal.     Left Ear: Tympanic membrane, ear canal and external ear normal.     Nose: Congestion present.     Mouth/Throat:     Mouth: Mucous membranes are moist.     Pharynx: Oropharynx is clear. No oropharyngeal exudate or posterior oropharyngeal erythema.  Cardiovascular:     Rate and Rhythm: Regular rhythm. Tachycardia present.  Pulmonary:     Effort: Pulmonary effort is normal.     Breath sounds: Normal breath sounds. No wheezing or rhonchi.     Comments: No cough overheard during H&P Neurological:     Mental Status: He is alert.      UC Treatments / Results  Labs (all labs ordered are listed, but only abnormal results are displayed) Labs Reviewed - No data to display  EKG   Radiology No results found.  Procedures Procedures (including critical care time)  Medications Ordered in UC Medications - No data to display  Initial Impression / Assessment and Plan / UC Course  I have reviewed the triage vital signs and the nursing notes.  Pertinent labs & imaging results that were available during my care of the patient were reviewed by me and considered in my medical decision making (see chart for details).     Likely viral URI.  Patient requested and I prescribed a prescription cough medicine; prescribed Tessalon Perles.  Discussed other over-the-counter treatments for management.  Final Clinical Impressions(s) / UC Diagnoses   Final diagnoses:  Viral URI with cough     Discharge Instructions      Don't take over the counter cough medicine or over the counter cold medicine that has something for cough in it when you take the prescription cough medicine. It's ok to take things like Mucinex and use saline nasal spray with the prescription cough medicine it. Drink plenty of liquids to stay hydrated.    ED Prescriptions     Medication Sig Dispense Auth. Provider   benzonatate (TESSALON) 100 MG capsule Take  1 capsule (100 mg total) by mouth every 8 (eight) hours. 21 capsule Carvel Getting, NP      PDMP not reviewed this encounter.   Carvel Getting,  NP 03/28/22 1912

## 2022-03-28 NOTE — Discharge Instructions (Addendum)
Don't take over the counter cough medicine or over the counter cold medicine that has something for cough in it when you take the prescription cough medicine. It's ok to take things like Mucinex and use saline nasal spray with the prescription cough medicine it. Drink plenty of liquids to stay hydrated.

## 2022-03-28 NOTE — ED Triage Notes (Signed)
Pt presents to uc with co of cough for three days., pt is concerned for bronchitis. Pt has been using otc cold and flu medications.

## 2022-08-02 ENCOUNTER — Ambulatory Visit
Admission: RE | Admit: 2022-08-02 | Discharge: 2022-08-02 | Disposition: A | Discharge status: Home / Self Care | Payer: Medicaid Other | Source: Ambulatory Visit

## 2022-08-02 VITALS — BP 123/75 | HR 98 | Temp 98.9°F | Resp 18

## 2022-08-02 DIAGNOSIS — H6501 Acute serous otitis media, right ear: Secondary | ICD-10-CM

## 2022-08-02 DIAGNOSIS — H6121 Impacted cerumen, right ear: Secondary | ICD-10-CM

## 2022-08-02 MED ORDER — AMOXICILLIN 500 MG PO CAPS: 500.0000 mg | ORAL_CAPSULE | Freq: Two times a day (BID) | ORAL | 0 refills | Status: AC

## 2022-08-02 NOTE — ED Triage Notes (Signed)
Patient to Urgent Care with complaints of right ear fullness that started two days ago. Denies any pain or fevers.

## 2022-08-02 NOTE — ED Provider Notes (Signed)
Nathan Barnett    CSN: [id number] Arrival date & time: 08/02/22  1851      History   Chief Complaint Chief Complaint  Patient presents with   Ear Fullness    Entered by patient    HPI Nathan Barnett. is a 32 y.o. male.   Patient presents for evaluation of right-sided ear fullness present for 2 days.  Area feels clogged.  Has attempted to clean ears with and over-the-counter medications.  Denies pain, drainage, fever, congestion.  Past Medical History:  Diagnosis Date   Mental health problem     Patient Active Problem List   Diagnosis Date Noted   Schizophrenia ([facility]) 03/20/2021   Psychosis ([facility]) 12/04/2019   Schizoaffective disorder, bipolar type ([facility])     No past surgical history on file.     Home Medications    Prior to Admission medications   Medication Sig Start Date End Date Taking? Authorizing Provider  amoxicillin (AMOXIL) 500 MG capsule Take 1 capsule (500 mg total) by mouth 2 (two) times daily for 10 days. 08/02/22 08/12/22 Yes Nathan Cerro R, NP  memantine (NAMENDA) 5 MG tablet Take 5 mg by mouth 2 (two) times daily. 06/28/22  Yes [provider]  OLANZapine (ZYPREXA) 5 MG tablet Take 5 mg by mouth 2 (two) times daily. 08/01/22  Yes [provider]  amLODipine (NORVASC) 5 MG tablet Take 1 tablet (5 mg total) by mouth daily. Patient not taking: Reported on 03/28/2022 04/01/21   Clapacs, Nathan Denmark, MD  benzonatate (TESSALON) 100 MG capsule Take 1 capsule (100 mg total) by mouth every 8 (eight) hours. Patient not taking: Reported on 08/02/2022 03/28/22   Nathan Parsons, NP  benztropine (COGENTIN) 0.5 MG tablet Take 2 tablets (1 mg) by mouth 2 (two) times daily. 04/01/21   Clapacs, Nathan Denmark, MD  busPIRone (BUSPAR) 15 MG tablet Take 1 tablet (15 mg total) by mouth 2 (two) times daily. 04/01/21   Clapacs, Nathan Denmark, MD  lithium carbonate (ESKALITH) 450 MG CR tablet Take 2 tablets (900 mg total) by mouth at bedtime. 04/01/21   Clapacs, Nathan Denmark, MD   paliperidone (INVEGA) 6 MG 24 hr tablet Take 6 mg by mouth daily. 03/11/22   [provider]  QUEtiapine (SEROQUEL) 400 MG tablet Take 1 tablet (400 mg total) by mouth at bedtime. 04/01/21   Clapacs, Nathan Denmark, MD  thiamine 100 MG tablet Take 1 tablet (100 mg total) by mouth daily. 04/01/21   Clapacs, Nathan Denmark, MD  traZODone (DESYREL) 50 MG tablet Take 50 mg by mouth at bedtime as needed. 03/11/22   [provider]    Family History No family history on file.  Social History Social History   Tobacco Use   Smoking status: Every Day    Packs/day: 0.50    Years: 2.00    Additional pack years: 0.00    Total pack years: 1.00    Types: Cigarettes   Smokeless tobacco: Never  Vaping Use   Vaping Use: Never used  Substance Use Topics   Alcohol use: Yes   Drug use: Not Currently     Allergies   Patient has no known allergies.   Review of Systems Review of Systems   Physical Exam Triage Vital Signs ED Triage Vitals  Enc Vitals Group     BP 08/02/22 1913 123/75     Pulse Rate 08/02/22 1913 98     Resp 08/02/22 1913 18     Temp  08/02/22 1913 98.9 F (37.2 C)     Temp Source 08/02/22 1913 Oral     SpO2 08/02/22 1913 97 %     Weight --      Height --      Head Circumference --      Peak Flow --      Pain Score 08/02/22 1907 0     Pain Loc --      Pain Edu? --      Excl. in [place]? --    No data found.  Updated Vital Signs BP 123/75 (BP Location: Left Arm)   Pulse 98   Temp 98.9 F (37.2 C) (Oral)   Resp 18   SpO2 97%   Visual Acuity Right Eye Distance:   Left Eye Distance:   Bilateral Distance:    Right Eye Near:   Left Eye Near:    Bilateral Near:     Physical Exam Constitutional:      Appearance: Normal appearance.  HENT:     Right Ear: There is impacted cerumen. Tympanic membrane is erythematous.     Left Ear: Tympanic membrane, ear canal and external ear normal.  Eyes:     Extraocular Movements: Extraocular movements intact.  Pulmonary:      Effort: Pulmonary effort is normal.  Neurological:     Mental Status: He is alert and oriented to person, place, and time.      UC Treatments / Results  Labs (all labs ordered are listed, but only abnormal results are displayed) Labs Reviewed - No data to display  EKG   Radiology No results found.  Procedures Procedures (including critical care time)  Medications Ordered in UC Medications - No data to display  Initial Impression / Assessment and Plan / UC Course  I have reviewed the triage vital signs and the nursing notes.  Pertinent labs & imaging results that were available during my care of the patient were reviewed by me and considered in my medical decision making (see chart for details).  Nonrecurrent acute serous otitis media of right ear, impacted cerumen of right ear  Initial evaluation cerumen impaction  On initial evaluation cerumen impaction present to the right ear, water irrigation completed by nursing staff, effective, reevaluation erythema is present to the tympanic membrane without abnormality to the canal or external ear, discussed with patient, prescribed amoxicillin, recommended supportive care, may follow-up with urgent care as needed for any further concerns Final Clinical Impressions(s) / UC Diagnoses   Final diagnoses:  Impacted cerumen of right ear  Non-recurrent acute serous otitis media of right ear     Discharge Instructions      Today you were treated for ear fullness due to buildup of wax, ears have been irrigated with water  Moving forward you may use over-the-counter Debrox drops to help thin secretions making it easier to clean  Today you are being treated for an infection of the eardrum  Take amoxicillin twice daily for 10 days, you should begin to see improvement after 48 hours of medication use and then it should progressively get better  You may use Tylenol or ibuprofen for management of discomfort  May hold warm compresses  to the ear for additional comfort  Please not attempted any ear cleaning or object or fluid placement into the ear canal to prevent further irritation    may follow-up with his urgent care as needed if fullness recurs    ED Prescriptions     Medication Sig Dispense  Auth. Provider   amoxicillin (AMOXIL) 500 MG capsule Take 1 capsule (500 mg total) by mouth 2 (two) times daily for 10 days. 20 capsule Valinda Hoar, NP      PDMP not reviewed this encounter.   Valinda Hoar, NP 08/02/22 1943

## 2022-08-02 NOTE — Discharge Instructions (Addendum)
Today you were treated for ear fullness due to buildup of wax, ears have been irrigated with water  Moving forward you may use over-the-counter Debrox drops to help thin secretions making it easier to clean  Today you are being treated for an infection of the eardrum  Take amoxicillin twice daily for 10 days, you should begin to see improvement after 48 hours of medication use and then it should progressively get better  You may use Tylenol or ibuprofen for management of discomfort  May hold warm compresses to the ear for additional comfort  Please not attempted any ear cleaning or object or fluid placement into the ear canal to prevent further irritation    may follow-up with his urgent care as needed if fullness recurs

## 2022-09-27 ENCOUNTER — Inpatient Hospital Stay
Admission: RE | Admit: 2022-09-27 | Discharge: 2022-09-27 | Disposition: A | Discharge status: Home / Self Care | Payer: MEDICAID | Source: Ambulatory Visit

## 2022-09-28 ENCOUNTER — Emergency Department
Admission: EM | Admit: 2022-09-28 | Discharge: 2022-09-28 | Disposition: A | Discharge status: Home / Self Care | Payer: MEDICAID | Attending: Emergency Medicine | Admitting: Emergency Medicine

## 2022-09-28 ENCOUNTER — Other Ambulatory Visit: Payer: Self-pay

## 2022-09-28 DIAGNOSIS — Y92000 Kitchen of unspecified non-institutional (private) residence as  the place of occurrence of the external cause: Secondary | ICD-10-CM | POA: Diagnosis not present

## 2022-09-28 DIAGNOSIS — S61211A Laceration without foreign body of left index finger without damage to nail, initial encounter: Secondary | ICD-10-CM | POA: Diagnosis present

## 2022-09-28 DIAGNOSIS — Y93G3 Activity, cooking and baking: Secondary | ICD-10-CM | POA: Insufficient documentation

## 2022-09-28 DIAGNOSIS — W260XXA Contact with knife, initial encounter: Secondary | ICD-10-CM | POA: Insufficient documentation

## 2022-09-28 MED ORDER — LIDOCAINE HCL (PF) 1 % IJ SOLN
5.0000 mL | Freq: Once | INTRAMUSCULAR | Status: AC
  Administered 2022-09-28: 5 mL
  Filled 2022-09-28: qty 5

## 2022-09-28 NOTE — ED Triage Notes (Signed)
Pt to ED for laceration to left pointer finger, cut on knife cooking. Bleeding controlled with pressure. Reports UTD on tetanus

## 2022-09-28 NOTE — ED Notes (Signed)
Pt verbalizes understanding of discharge instructions. Opportunity for questioning and answers were provided. Pt discharged from ED to home.   ? ?

## 2022-09-28 NOTE — Discharge Instructions (Signed)
You were evaluated in the emergency department for a laceration. It was repaired with sutures. Keep the area clean and dry.  Wash multiple times per day with soap and water.  Do not go into the ocean or swimming pool.  Return to the emergency department or your primary care physician's office in 7 days for suture removal.  Return to the emergency department for:  -- Fever > 100.4F -- Increase pain in the wound -- Increase redness and swelling -- Pus coming from the wound -- Wound bleeds more than a small amount or it does not stop -- Wound edges come apart -- Severe pain -- Weakness or numbness in the affected area  Or any other new or worsening symptoms. It was a pleasure caring for you.  

## 2022-09-28 NOTE — ED Provider Notes (Signed)
[facility] Provider Note    Event Date/Time   First MD Initiated Contact with Patient 09/28/22 1120     (approximate)   History   Laceration  Patient return to the emergency department shortly after discharge because he had stuck his hand in his pocket and tore one of the sutures open.  He began to bleed again.  He denies any pain.  He was brought back to an emergency department room, at which time he had already achieved hemostasis.  He requested an additional suture to be placed.  Digital block was performed and the area was irrigated again, and also cleaned with chlorhexidine.  An additional suture was placed, and this time the finger was splinted and wrapped for protection.  We again discussed wound care and return precautions he was advised to remove the splint several times a day in order to wash with soap and water, and then reapply the splint.  Patient understands and agrees with plan.  He was discharged again in stable condition.  Plan also discussed with mom who agrees with plan of care.  PROCEDURES:  Critical Care performed:   Marland KitchenMarland KitchenLaceration Repair  Date/Time: 09/28/2022 2:44 PM  Performed by: Jackelyn Hoehn, PA-C Authorized by: Jackelyn Hoehn, PA-C   Consent:    Consent obtained:  Verbal   Consent given by:  Patient   Risks, benefits, and alternatives were discussed: yes     Risks discussed:  Infection, need for additional repair, nerve damage, pain, poor cosmetic result, poor wound healing, retained foreign body, tendon damage and vascular damage   Alternatives discussed:  No treatment Universal protocol:    Procedure explained and questions answered to patient or proxy's satisfaction: yes     Relevant documents present and verified: yes     Test results available: yes     Required blood products, implants, devices, and special equipment available: yes     Site/side marked: yes     Immediately prior to procedure, a time out was called: yes    Anesthesia:    Anesthesia method:  Nerve block   Block needle gauge:  27 G   Block anesthetic:  Lidocaine 1% w/o epi   Block injection procedure:  Anatomic landmarks identified, anatomic landmarks palpated, introduced needle, negative aspiration for blood and incremental injection   Block outcome:  Anesthesia achieved Laceration details:    Location:  Finger   Finger location:  L index finger   Length (cm):  2   Depth (mm):  2 Pre-procedure details:    Preparation:  Patient was prepped and draped in usual sterile fashion Exploration:    Limited defect created (wound extended): no     Hemostasis achieved with:  Direct pressure (Hemostasis by the time patient was in ER bed)   Imaging outcome: foreign body not noted     Wound exploration: wound explored through full range of motion and entire depth of wound visualized     Wound exploration comment:  1 torn suture noted Treatment:    Area cleansed with:  Chlorhexidine and saline   Amount of cleaning:  Extensive   Irrigation solution:  Tap water   Irrigation volume:  And chlorhexidine   Irrigation method:  Tap   Debridement:  None   Undermining:  None   Scar revision: no   Skin repair:    Repair method:  Sutures (2 additional sutures placed, 1 torn suture noted, previous sutures appear intact)   Suture size:  5-0  Suture material:  Nylon   Suture technique:  Simple interrupted   Number of sutures:  2 (2 additional placed, previous sutures appear to be intact with the exception of 1 torn suture)) Repair type:    Repair type:  Simple Post-procedure details:    Dressing:  Non-adherent dressing and splint for protection   Procedure completion:  Tolerated well, no immediate complications    MEDICATIONS ORDERED IN ED: Medications  lidocaine (PF) (XYLOCAINE) 1 % injection 5 mL (5 mLs Infiltration Given 09/28/22 1135)      FINAL CLINICAL IMPRESSION(S) / ED DIAGNOSES   Final diagnoses:  Laceration of left index finger without  foreign body without damage to nail, initial encounter     Rx / DC Orders   ED Discharge Orders     None        Note:  This document was prepared using Dragon voice recognition software and may include unintentional dictation errors.   Jackelyn Hoehn, PA-C 09/28/22 1447    Corena Herter, MD 09/28/22 1615

## 2022-09-28 NOTE — ED Provider Notes (Signed)
[facility] Provider Note    Event Date/Time   First MD Initiated Contact with Patient 09/28/22 1120     (approximate)   History   Laceration   HPI  Nathan Barnett. is a 32 y.o. male right-hand-dominant male who presents today for evaluation of left finger laceration.  Patient reports that he was cutting sausage when he accidentally sliced his finger.  He reports that he still able to flex and extend his finger normally.  He has not had any paresthesias.  He reports his tetanus is up-to-date.  Patient Active Problem List   Diagnosis Date Noted   Schizophrenia (HCC) 03/20/2021   Psychosis (HCC) 12/04/2019   Schizoaffective disorder, bipolar type [facility])           Physical Exam   Triage Vital Signs: ED Triage Vitals  Encounter Vitals Group     BP 09/28/22 1109 135/89     Systolic BP Percentile --      Diastolic BP Percentile --      Pulse Rate 09/28/22 1109 (!) 119     Resp 09/28/22 1109 18     Temp 09/28/22 1109 98.3 F (36.8 C)     Temp Source 09/28/22 1109 Oral     SpO2 09/28/22 1109 97 %     Weight 09/28/22 1109 170 lb (77.1 kg)     Height 09/28/22 1109 6' (1.829 m)     Head Circumference --      Peak Flow --      Pain Score 09/28/22 1112 1     Pain Loc --      Pain Education --      Exclude from Growth Chart --     Most recent vital signs: Vitals:   09/28/22 1109 09/28/22 1151  BP: 135/89   Pulse: (!) 119 100  Resp: 18 18  Temp: 98.3 F (36.8 C)   SpO2: 97% 98%    Physical Exam Vitals and nursing note reviewed.  Constitutional:      General: Awake and alert. No acute distress.    Appearance: Normal appearance. The patient is normal weight.  HENT:     Head: Normocephalic and atraumatic.     Mouth: Mucous membranes are moist.  Eyes:     General: PERRL. Normal EOMs        Right eye: No discharge.        Left eye: No discharge.     Conjunctiva/sclera: Conjunctivae normal.  Cardiovascular:     Rate and Rhythm:  Normal rate and regular rhythm.     Pulses: Normal pulses.  Pulmonary:     Effort: Pulmonary effort is normal. No respiratory distress.     Breath sounds: Normal breath sounds.  Abdominal:     Abdomen is soft. There is no abdominal tenderness. No rebound or guarding. No distention. Musculoskeletal:        General: No swelling. Normal range of motion.     Cervical back: Normal range of motion and neck supple.  Left index finger with laceration to the radial aspect of the middle phalanx measuring approximate 2 cm.  He is able to flex and extend at isolated PIP and DIP against resistance.  Normal capillary refill.  Sensation intact light touch throughout.  No other abnormalities noted Skin:    General: Skin is warm and dry.     Capillary Refill: Capillary refill takes less than 2 seconds.     Findings: No rash.  Neurological:  Mental Status: The patient is awake and alert.      ED Results / Procedures / Treatments   Labs (all labs ordered are listed, but only abnormal results are displayed) Labs Reviewed - No data to display   EKG     RADIOLOGY     PROCEDURES:  Critical Care performed:   Marland KitchenMarland KitchenLaceration Repair  Date/Time: 09/28/2022 1:33 PM  Performed by: Jackelyn Hoehn, PA-C Authorized by: Jackelyn Hoehn, PA-C   Consent:    Consent obtained:  Verbal   Consent given by:  Patient   Risks, benefits, and alternatives were discussed: yes     Risks discussed:  Infection, need for additional repair, nerve damage, pain, tendon damage, vascular damage, poor wound healing, poor cosmetic result and retained foreign body   Alternatives discussed:  No treatment Universal protocol:    Procedure explained and questions answered to patient or proxy's satisfaction: yes     Relevant documents present and verified: yes     Test results available: yes     Required blood products, implants, devices, and special equipment available: yes     Site/side marked: yes     Immediately  prior to procedure, a time out was called: yes     Patient identity confirmed:  Verbally with patient Anesthesia:    Anesthesia method:  Nerve block   Block needle gauge:  27 G   Block anesthetic:  Lidocaine 1% w/o epi   Block injection procedure:  Anatomic landmarks identified, introduced needle, incremental injection, anatomic landmarks palpated and negative aspiration for blood   Block outcome:  Anesthesia achieved Laceration details:    Location:  Finger   Finger location:  L index finger   Length (cm):  2   Depth (mm):  1 Pre-procedure details:    Preparation:  Patient was prepped and draped in usual sterile fashion Exploration:    Limited defect created (wound extended): no     Hemostasis achieved with:  Direct pressure   Imaging outcome: foreign body not noted     Wound exploration: wound explored through full range of motion and entire depth of wound visualized     Wound extent: areolar tissue not violated, fascia not violated, no foreign body and no signs of injury      MEDICATIONS ORDERED IN ED: Medications  lidocaine (PF) (XYLOCAINE) 1 % injection 5 mL (5 mLs Infiltration Given 09/28/22 1135)     IMPRESSION / MDM / ASSESSMENT AND PLAN / ED COURSE  I reviewed the triage vital signs and the nursing notes.   Differential diagnosis includes, but is not limited to, laceration, tendon injury, bony injury.  Patient is awake and alert, hemodynamically stable and afebrile. Patient is neurovascularly intact.  Sensation intact to light touch throughout finger, do not suspect nerve injury.  Able to flex and extend at isolated DIP and PIP, do not suspect tendon injury.  No fingernail or nailbed involvement. Wound fully probed and evaluated, no retained foreign body.  Wound was anesthetized and irrigated, closed with sutures.  We discussed suture care and timeline for removal.  Tdap up-to-date.  Patient understands and agrees with plan.  He is discharged in stable  condition.   Patient's presentation is most consistent with acute complicated illness / injury requiring diagnostic workup.   FINAL CLINICAL IMPRESSION(S) / ED DIAGNOSES   Final diagnoses:  Laceration of left index finger without foreign body without damage to nail, initial encounter     Rx / DC Orders  ED Discharge Orders     None        Note:  This document was prepared using Dragon voice recognition software and may include unintentional dictation errors.   Keturah Shavers 09/28/22 1341    Corena Herter, MD 09/28/22 1615

## 2022-10-06 ENCOUNTER — Ambulatory Visit
Admission: RE | Admit: 2022-10-06 | Discharge: 2022-10-06 | Disposition: A | Discharge status: Home / Self Care | Payer: MEDICAID | Source: Ambulatory Visit | Attending: Emergency Medicine | Admitting: Emergency Medicine

## 2022-10-06 VITALS — BP 119/75 | HR 74 | Temp 98.0°F | Resp 16

## 2022-10-06 DIAGNOSIS — J069 Acute upper respiratory infection, unspecified: Secondary | ICD-10-CM

## 2022-10-06 DIAGNOSIS — Z4802 Encounter for removal of sutures: Secondary | ICD-10-CM

## 2022-10-06 MED ORDER — AMOXICILLIN-POT CLAVULANATE 875-125 MG PO TABS: 1.0000 | ORAL_TABLET | Freq: Two times a day (BID) | ORAL | 0 refills | Status: DC

## 2022-10-06 NOTE — ED Notes (Signed)
Patient here for suture removal. Per ED note two were placed to left index finger. At visit 5 sutures intact. No s/s of infection. Provider assessed finger before removal and gave instructions to remove at today's visit. Tolerated well.

## 2022-10-06 NOTE — ED Triage Notes (Signed)
Patient presents to UC for suture removal. Two sutures placed on 07/31 to left index finger. He also reports chest congestion x 1 month. Taking OTC cough meds.   Denies fever or SOB.

## 2022-10-06 NOTE — ED Provider Notes (Signed)
Nathan Barnett    CSN: [id number] Arrival date & time: 10/06/22  0857      History   Chief Complaint Chief Complaint  Patient presents with   Suture / Staple Removal    Stitches Removed. and chest congestion - Entered by patient   Nasal Congestion    HPI Nathan Barnett. is a 32 y.o. male.   Patient presents for evaluation of of chest congestion and a nonproductive cough present for 1 month.  Has been using over-the-counter medications, unable to recall which has been helpful but symptoms have been persistent.  Denies presence of fevers, chills body aches, nasal congestion, ear pain, sore throat, shortness of breath or wheezing.  No known sick contact prior.  Denies respiratory history, non-smoker.  Patient presents for suture removal to a laceration of the left index finger placed on 09/28/2022 in the emergency department.  Denies all signs of infection.   Past Medical History:  Diagnosis Date   Mental health problem     Patient Active Problem List   Diagnosis Date Noted   Schizophrenia (HCC) 03/20/2021   Psychosis (HCC) 12/04/2019   Schizoaffective disorder, bipolar type (HCC)     History reviewed. No pertinent surgical history.     Home Medications    Prior to Admission medications   Medication Sig Start Date End Date Taking? Authorizing Provider  amoxicillin-clavulanate (AUGMENTIN) 875-125 MG tablet Take 1 tablet by mouth every 12 (twelve) hours. 10/06/22  Yes Nathan Knack R, NP  amLODipine (NORVASC) 5 MG tablet Take 1 tablet (5 mg total) by mouth daily. Patient not taking: Reported on 03/28/2022 04/01/21   Clapacs, Jackquline Denmark, MD  benzonatate (TESSALON) 100 MG capsule Take 1 capsule (100 mg total) by mouth every 8 (eight) hours. Patient not taking: Reported on 08/02/2022 03/28/22   Cathlyn Parsons, NP  benztropine (COGENTIN) 0.5 MG tablet Take 2 tablets (1 mg) by mouth 2 (two) times daily. 04/01/21   Clapacs, Jackquline Denmark, MD  busPIRone (BUSPAR) 15 MG tablet Take  1 tablet (15 mg total) by mouth 2 (two) times daily. 04/01/21   Clapacs, Jackquline Denmark, MD  lithium carbonate (ESKALITH) 450 MG CR tablet Take 2 tablets (900 mg total) by mouth at bedtime. 04/01/21   Clapacs, Jackquline Denmark, MD  memantine (NAMENDA) 5 MG tablet Take 5 mg by mouth 2 (two) times daily. 06/28/22   [provider]  OLANZapine (ZYPREXA) 5 MG tablet Take 5 mg by mouth 2 (two) times daily. 08/01/22   [provider]  paliperidone (INVEGA) 6 MG 24 hr tablet Take 6 mg by mouth daily. 03/11/22   [provider]  QUEtiapine (SEROQUEL) 400 MG tablet Take 1 tablet (400 mg total) by mouth at bedtime. 04/01/21   Clapacs, Jackquline Denmark, MD  thiamine 100 MG tablet Take 1 tablet (100 mg total) by mouth daily. 04/01/21   Clapacs, Jackquline Denmark, MD  traZODone (DESYREL) 50 MG tablet Take 50 mg by mouth at bedtime as needed. 03/11/22   [provider]    Family History History reviewed. No pertinent family history.  Social History Social History   Tobacco Use   Smoking status: Every Day    Current packs/day: 0.50    Average packs/day: 0.5 packs/day for 2.0 years (1.0 ttl pk-yrs)    Types: Cigarettes   Smokeless tobacco: Never  Vaping Use   Vaping status: Never Used  Substance Use Topics   Alcohol use: Yes   Drug use: Not Currently  Allergies   Patient has no known allergies.   Review of Systems Review of Systems   Physical Exam Triage Vital Signs ED Triage Vitals [10/06/22 0911]  Encounter Vitals Group     BP 119/75     Systolic BP Percentile      Diastolic BP Percentile      Pulse Rate 74     Resp 16     Temp 98 F (36.7 C)     Temp Source Temporal     SpO2 97 %     Weight      Height      Head Circumference      Peak Flow      Pain Score 0     Pain Loc      Pain Education      Exclude from Growth Chart    No data found.  Updated Vital Signs BP 119/75 (BP Location: Left Arm)   Pulse 74   Temp 98 F (36.7 C) (Temporal)   Resp 16   SpO2 97%   Visual  Acuity Right Eye Distance:   Left Eye Distance:   Bilateral Distance:    Right Eye Near:   Left Eye Near:    Bilateral Near:     Physical Exam Constitutional:      Appearance: Normal appearance.  HENT:     Right Ear: Tympanic membrane, ear canal and external ear normal.     Left Ear: Tympanic membrane, ear canal and external ear normal.     Nose: Nose normal.     Mouth/Throat:     Mouth: Mucous membranes are moist.     Pharynx: Oropharynx is clear.  Eyes:     Extraocular Movements: Extraocular movements intact.  Cardiovascular:     Rate and Rhythm: Normal rate and regular rhythm.     Pulses: Normal pulses.     Heart sounds: Normal heart sounds.  Pulmonary:     Effort: Pulmonary effort is normal.     Breath sounds: Normal breath sounds.  Skin:    Comments: 5 sutures present to the left index finger, dry and intact, has full range of motion, sensation intact and capillary refill less than 3, no drainage, swelling or tenderness on exam  Neurological:     Mental Status: He is alert and oriented to person, place, and time. Mental status is at baseline.      UC Treatments / Results  Labs (all labs ordered are listed, but only abnormal results are displayed) Labs Reviewed - No data to display  EKG   Radiology No results found.  Procedures Procedures (including critical care time)  Medications Ordered in UC Medications - No data to display  Initial Impression / Assessment and Plan / UC Course  I have reviewed the triage vital signs and the nursing notes.  Pertinent labs & imaging results that were available during my care of the patient were reviewed by me and considered in my medical decision making (see chart for details).  Upper respiratory infection, visit for suture removal  Patient is in no signs of distress nor toxic appearing.  Vital signs are stable.  Low suspicion for pneumonia, pneumothorax or bronchitis and therefore will defer imaging.  Symptoms  present for 1 month, Augmentin prescribed.May use additional over-the-counter medications as needed for supportive care.  May follow-up with urgent care as needed if symptoms persist or worsen.  Per ED documentation, 2 sutures placed, on exam there are 5 sutures to the left  index finger, no signs for infection, nursing staff also counting 5 sutures, removed, may follow-up for any concerns Final Clinical Impressions(s) / UC Diagnoses   Final diagnoses:  Visit for suture removal  Acute URI     Discharge Instructions        sutures have been removed by nursing staff, no signs of infection and wound has appeared to heal, may continue activity as tolerated  Take Augmentin every morning and every evening for 7 days which will clear bacteria which is most likely prolonging symptoms    You can take Tylenol and/or Ibuprofen as needed for fever reduction and pain relief.   For cough: honey 1/2 to 1 teaspoon (you can dilute the honey in water or another fluid).  You can also use guaifenesin and dextromethorphan for cough. You can use a humidifier for chest congestion and cough.  If you don't have a humidifier, you can sit in the bathroom with the hot shower running.      For sore throat: try warm salt water gargles, cepacol lozenges, throat spray, warm tea or water with lemon/honey, popsicles or ice, or OTC cold relief medicine for throat discomfort.   For congestion: take a daily anti-histamine like Zyrtec, Claritin, and a oral decongestant, such as pseudoephedrine.  You can also use Flonase 1-2 sprays in each nostril daily.   It is important to stay hydrated: drink plenty of fluids (water, gatorade/powerade/pedialyte, juices, or teas) to keep your throat moisturized and help further relieve irritation/discomfort.    ED Prescriptions     Medication Sig Dispense Auth. Provider   amoxicillin-clavulanate (AUGMENTIN) 875-125 MG tablet Take 1 tablet by mouth every 12 (twelve) hours. 14 tablet  Yaw Escoto, Elita Boone, NP      PDMP not reviewed this encounter.   Valinda Hoar, [place] 10/06/22 [phone]

## 2022-10-06 NOTE — Discharge Instructions (Addendum)
sutures have been removed by nursing staff, no signs of infection and wound has appeared to heal, may continue activity as tolerated  Take Augmentin every morning and every evening for 7 days which will clear bacteria which is most likely prolonging symptoms    You can take Tylenol and/or Ibuprofen as needed for fever reduction and pain relief.   For cough: honey 1/2 to 1 teaspoon (you can dilute the honey in water or another fluid).  You can also use guaifenesin and dextromethorphan for cough. You can use a humidifier for chest congestion and cough.  If you don't have a humidifier, you can sit in the bathroom with the hot shower running.      For sore throat: try warm salt water gargles, cepacol lozenges, throat spray, warm tea or water with lemon/honey, popsicles or ice, or OTC cold relief medicine for throat discomfort.   For congestion: take a daily anti-histamine like Zyrtec, Claritin, and a oral decongestant, such as pseudoephedrine.  You can also use Flonase 1-2 sprays in each nostril daily.   It is important to stay hydrated: drink plenty of fluids (water, gatorade/powerade/pedialyte, juices, or teas) to keep your throat moisturized and help further relieve irritation/discomfort.

## 2022-10-17 ENCOUNTER — Ambulatory Visit
Admission: RE | Admit: 2022-10-17 | Discharge: 2022-10-17 | Disposition: A | Discharge status: Home / Self Care | Payer: MEDICAID | Source: Ambulatory Visit | Attending: Emergency Medicine | Admitting: Emergency Medicine

## 2022-10-17 VITALS — BP 138/86 | HR 92 | Temp 98.3°F | Resp 18

## 2022-10-17 DIAGNOSIS — Z5189 Encounter for other specified aftercare: Secondary | ICD-10-CM

## 2022-10-17 DIAGNOSIS — R051 Acute cough: Secondary | ICD-10-CM

## 2022-10-17 MED ORDER — MUPIROCIN 2 % EX OINT: 1.0000 | TOPICAL_OINTMENT | Freq: Two times a day (BID) | CUTANEOUS | 0 refills | Status: AC

## 2022-10-17 MED ORDER — BENZONATATE 100 MG PO CAPS: 100.0000 mg | ORAL_CAPSULE | Freq: Three times a day (TID) | ORAL | 0 refills | Status: DC | PRN

## 2022-10-17 NOTE — Discharge Instructions (Signed)
Take the benzonatate for your cough.    Apply the mupirocin ointment as directed.    Follow up with your primary care provider if your symptoms are not improving.

## 2022-10-17 NOTE — ED Provider Notes (Signed)
[name]    CSN: [id number] Arrival date & time: 10/17/22  1357      History   Chief Complaint Chief Complaint  Patient presents with   Cough    Entered by patient    HPI Dyllin Lichtenstein. is a 32 y.o. male.  Patient presents with 2-3 week history of cough.  He also presents with request for recheck of a wound on his left index finger which he reports is still painful.  He denies fever, chills, earache, sore throat, shortness of breath, chest pain, wound drainage, numbness, weakness, or other symptoms.  No OTC medications taken at home.  Patient was seen at [facility] ED on 09/28/2022 for a laceration on his left index finger.  He was seen at this urgent care on 10/06/2022 for suture removal and acute URI; treated with Augmentin.  His medical history includes schizophrenia and psychosis.  The history is provided by the patient and medical records.    Past Medical History:  Diagnosis Date   Mental health problem     Patient Active Problem List   Diagnosis Date Noted   Schizophrenia (HCC) 03/20/2021   Psychosis (HCC) 12/04/2019   Schizoaffective disorder, bipolar type (HCC)     History reviewed. No pertinent surgical history.     Home Medications    Prior to Admission medications   Medication Sig Start Date End Date Taking? Authorizing Provider  benzonatate (TESSALON) 100 MG capsule Take 1 capsule (100 mg total) by mouth 3 (three) times daily as needed for cough. 10/17/22  Yes Mickie Bail, NP  mupirocin ointment (BACTROBAN) 2 % Apply 1 Application topically 2 (two) times daily. 10/17/22  Yes Mickie Bail, NP  amLODipine (NORVASC) 5 MG tablet Take 1 tablet (5 mg total) by mouth daily. Patient not taking: Reported on 03/28/2022 04/01/21   Clapacs, Jackquline Denmark, MD  amoxicillin-clavulanate (AUGMENTIN) 875-125 MG tablet Take 1 tablet by mouth every 12 (twelve) hours. Patient not taking: Reported on 10/17/2022 10/06/22   Valinda Hoar, NP  benztropine (COGENTIN) 0.5  MG tablet Take 2 tablets (1 mg) by mouth 2 (two) times daily. 04/01/21   Clapacs, Jackquline Denmark, MD  busPIRone (BUSPAR) 15 MG tablet Take 1 tablet (15 mg total) by mouth 2 (two) times daily. 04/01/21   Clapacs, Jackquline Denmark, MD  lithium carbonate (ESKALITH) 450 MG CR tablet Take 2 tablets (900 mg total) by mouth at bedtime. 04/01/21   Clapacs, Jackquline Denmark, MD  memantine (NAMENDA) 5 MG tablet Take 5 mg by mouth 2 (two) times daily. 06/28/22   [provider]  OLANZapine (ZYPREXA) 5 MG tablet Take 5 mg by mouth 2 (two) times daily. 08/01/22   [provider]  paliperidone (INVEGA) 6 MG 24 hr tablet Take 6 mg by mouth daily. 03/11/22   [provider]  QUEtiapine (SEROQUEL) 400 MG tablet Take 1 tablet (400 mg total) by mouth at bedtime. 04/01/21   Clapacs, Jackquline Denmark, MD  thiamine 100 MG tablet Take 1 tablet (100 mg total) by mouth daily. 04/01/21   Clapacs, Jackquline Denmark, MD  traZODone (DESYREL) 50 MG tablet Take 50 mg by mouth at bedtime as needed. 03/11/22   [provider]    Family History History reviewed. No pertinent family history.  Social History Social History   Tobacco Use   Smoking status: Every Day    Current packs/day: 0.50    Average packs/day: 0.5 packs/day for 2.0 years (1.0 ttl pk-yrs)  Types: Cigarettes   Smokeless tobacco: Never  Vaping Use   Vaping status: Never Used  Substance Use Topics   Alcohol use: Yes   Drug use: Not Currently     Allergies   Patient has no known allergies.   Review of Systems Review of Systems  Constitutional:  Negative for chills and fever.  HENT:  Negative for ear pain and sore throat.   Respiratory:  Positive for cough. Negative for shortness of breath.   Cardiovascular:  Negative for chest pain and palpitations.  Skin:  Positive for wound. Negative for color change.  Neurological:  Negative for weakness and numbness.     Physical Exam Triage Vital Signs ED Triage Vitals  Encounter Vitals Group     BP 10/17/22 1412 138/86      Systolic BP Percentile --      Diastolic BP Percentile --      Pulse Rate 10/17/22 1412 92     Resp 10/17/22 1412 18     Temp 10/17/22 1412 98.3 F (36.8 C)     Temp src --      SpO2 10/17/22 1412 98 %     Weight --      Height --      Head Circumference --      Peak Flow --      Pain Score 10/17/22 1408 7     Pain Loc --      Pain Education --      Exclude from Growth Chart --    No data found.  Updated Vital Signs BP 138/86   Pulse 92   Temp 98.3 F (36.8 C)   Resp 18   SpO2 98%   Visual Acuity Right Eye Distance:   Left Eye Distance:   Bilateral Distance:    Right Eye Near:   Left Eye Near:    Bilateral Near:     Physical Exam Vitals and nursing note reviewed.  Constitutional:      General: He is not in acute distress.    Appearance: He is well-developed.  HENT:     Mouth/Throat:     Mouth: Mucous membranes are moist.  Cardiovascular:     Rate and Rhythm: Normal rate and regular rhythm.     Heart sounds: Normal heart sounds.  Pulmonary:     Effort: Pulmonary effort is normal. No respiratory distress.     Breath sounds: Normal breath sounds.  Musculoskeletal:        General: No swelling or deformity. Normal range of motion.     Cervical back: Neck supple.  Skin:    General: Skin is warm and dry.     Capillary Refill: Capillary refill takes less than 2 seconds.     Findings: No erythema.     Comments: Healing wound on left index finger.  No erythema, edema, drainage.   Neurological:     Mental Status: He is alert.     Sensory: No sensory deficit.     Motor: No weakness.     Gait: Gait normal.      UC Treatments / Results  Labs (all labs ordered are listed, but only abnormal results are displayed) Labs Reviewed - No data to display  EKG   Radiology No results found.  Procedures Procedures (including critical care time)  Medications Ordered in UC Medications - No data to display  Initial Impression / Assessment and Plan / UC Course   I have reviewed the triage vital signs and  the nursing notes.  Pertinent labs & imaging results that were available during my care of the patient were reviewed by me and considered in my medical decision making (see chart for details).    Cough, Encounter for wound recheck.  Afebrile and vital signs are stable.  Lungs are clear, O2 sat 98% on room air.  Treating cough with Tessalon Perles.  The wound on his left index finger appears to be healing very well.  No indication of infection.  Discussed mupirocin ointment as needed.  Instructed him to follow-up with his PCP if he is not improving.  He agrees to plan of care.  Final Clinical Impressions(s) / UC Diagnoses   Final diagnoses:  Acute cough  Encounter for wound re-check     Discharge Instructions      Take the benzonatate for your cough.    Apply the mupirocin ointment as directed.    Follow up with your primary care provider if your symptoms are not improving.        ED Prescriptions     Medication Sig Dispense Auth. Provider   benzonatate (TESSALON) 100 MG capsule Take 1 capsule (100 mg total) by mouth 3 (three) times daily as needed for cough. 21 capsule Mickie Bail, NP   mupirocin ointment (BACTROBAN) 2 % Apply 1 Application topically 2 (two) times daily. 22 g Mickie Bail, NP      PDMP not reviewed this encounter.   Mickie Bail, NP 10/17/22 [phone]

## 2022-10-17 NOTE — ED Triage Notes (Addendum)
Patient to Urgent Care with complaints of cough that started two to three weeks ago. Reports cough is productive with clear phlegm. Denies any known fevers.   Also reports continued left index finger swelling and pain following an injury several weeks ago. Sutures have been removed but has had continued pain.

## 2022-11-15 ENCOUNTER — Ambulatory Visit (INDEPENDENT_AMBULATORY_CARE_PROVIDER_SITE_OTHER): Payer: MEDICAID | Admitting: Physician Assistant

## 2022-11-15 VITALS — BP 120/82 | HR 68 | Temp 97.8°F | Ht 72.0 in | Wt 157.0 lb

## 2022-11-15 DIAGNOSIS — Z7689 Persons encountering health services in other specified circumstances: Secondary | ICD-10-CM

## 2022-11-15 DIAGNOSIS — F250 Schizoaffective disorder, bipolar type: Secondary | ICD-10-CM

## 2022-11-15 DIAGNOSIS — F5222 Female sexual arousal disorder: Secondary | ICD-10-CM

## 2022-11-15 DIAGNOSIS — F17200 Nicotine dependence, unspecified, uncomplicated: Secondary | ICD-10-CM

## 2022-11-15 DIAGNOSIS — F2089 Other schizophrenia: Secondary | ICD-10-CM

## 2022-11-15 DIAGNOSIS — Z79899 Other long term (current) drug therapy: Secondary | ICD-10-CM

## 2022-11-15 NOTE — Progress Notes (Unsigned)
New patient visit  Patient: Nathan Barnett.   DOB: [birth year]   31 y.o. Male  MRN: [id number] Visit Date: 11/15/2022  Today's healthcare provider: Debera Lat, PA-C   Chief Complaint  Patient presents with   Establish Care    Patient has answered all questioned negatively.  When asked about medications he said he is not on any. When told there were medications listed in the computer he said he does not know what they are.  He said he doesn't want to be here today or any day.   Subjective    Nathan Guardian Joah Derise. is a 33 y.o. male who presents today as a new patient to establish care.  HPI HPI     Establish Care    Additional comments: Patient has answered all questioned negatively.  When asked about medications he said he is not on any. When told there were medications listed in the computer he said he does not know what they are.  He said he doesn't want to be here today or any day.      Last edited by Adline Peals, CMA on 11/15/2022 10:18 AM.      *** Discussed the use of AI scribe software for clinical note transcription with the patient, who gave verbal consent to proceed.  History of Present Illness            Past Medical History:  Diagnosis Date   Mental health problem    No past surgical history on file. No family status information on file.   No family history on file. Social History   Socioeconomic History   Marital status: Single    Spouse name: Not on file   Number of children: Not on file   Years of education: Not on file   Highest education level: Not on file  Occupational History   Not on file  Tobacco Use   Smoking status: Every Day    Current packs/day: 0.50    Average packs/day: 0.5 packs/day for 2.0 years (1.0 ttl pk-yrs)    Types: Cigarettes   Smokeless tobacco: Never  Vaping Use   Vaping status: Never Used  Substance and Sexual Activity   Alcohol use: Yes   Drug use: Not Currently   Sexual activity: Not Currently   Other Topics Concern   Not on file  Social History Narrative   Not on file   Social Determinants of Health   Financial Resource Strain: Not on file  Food Insecurity: Not on file  Transportation Needs: Not on file  Physical Activity: Not on file  Stress: Not on file  Social Connections: Not on file   Outpatient Medications Prior to Visit  Medication Sig   amLODipine (NORVASC) 5 MG tablet Take 1 tablet (5 mg total) by mouth daily. (Patient not taking: Reported on 03/28/2022)   amoxicillin-clavulanate (AUGMENTIN) 875-125 MG tablet Take 1 tablet by mouth every 12 (twelve) hours. (Patient not taking: Reported on 10/17/2022)   benzonatate (TESSALON) 100 MG capsule Take 1 capsule (100 mg total) by mouth 3 (three) times daily as needed for cough.   benztropine (COGENTIN) 0.5 MG tablet Take 2 tablets (1 mg) by mouth 2 (two) times daily.   busPIRone (BUSPAR) 15 MG tablet Take 1 tablet (15 mg total) by mouth 2 (two) times daily.   lithium carbonate (ESKALITH) 450 MG CR tablet Take 2 tablets (900 mg total) by mouth at bedtime.   memantine (NAMENDA) 5 MG tablet Take 5 mg by  mouth 2 (two) times daily.   mupirocin ointment (BACTROBAN) 2 % Apply 1 Application topically 2 (two) times daily.   OLANZapine (ZYPREXA) 5 MG tablet Take 5 mg by mouth 2 (two) times daily.   paliperidone (INVEGA) 6 MG 24 hr tablet Take 6 mg by mouth daily.   QUEtiapine (SEROQUEL) 400 MG tablet Take 1 tablet (400 mg total) by mouth at bedtime.   thiamine 100 MG tablet Take 1 tablet (100 mg total) by mouth daily.   traZODone (DESYREL) 50 MG tablet Take 50 mg by mouth at bedtime as needed.   No facility-administered medications prior to visit.   No Known Allergies   There is no immunization history on file for this patient.  Health Maintenance  Topic Date Due   DTaP/Tdap/Td (1 - Tdap) Never done   INFLUENZA VACCINE  Never done   COVID-19 Vaccine (3 - 2023-24 season) 10/30/2022   Hepatitis C Screening  Completed   HIV  Screening  Completed   HPV VACCINES  Aged Out    Patient Care Team: Debera Lat, PA-C as PCP - General (Physician Assistant)  Review of Systems Except see HPI   {Insert previous labs (optional):23779} {See past labs  Heme  Chem  Endocrine  Serology  Results Review (optional):1}   Objective    BP 120/82 (BP Location: Left Arm, Patient Position: Sitting, Cuff Size: Normal)   Pulse 68   Temp 97.8 F (36.6 C) (Oral)   Ht 6' (1.829 m)   Wt 157 lb (71.2 kg)   SpO2 100%   BMI 21.29 kg/m  {Insert last BP/Wt (optional):23777}{See vitals history (optional):1}   Physical Exam  Depression Screen    11/30/2019    5:09 PM  PHQ 2/9 Scores  PHQ - 2 Score   PHQ- 9 Score      Information is confidential and restricted. Go to Review Flowsheets to unlock data.   No results found for any visits on 11/15/22.  Assessment & Plan     *** Assessment and Plan              Encounter to establish care Welcomed to our clinic Reviewed past medical hx, social hx, family hx and surgical hx Pt advised to send all vaccination records or screening   No follow-ups on file.     [facility]

## 2022-11-16 ENCOUNTER — Encounter: Payer: Self-pay | Admitting: Physician Assistant

## 2022-11-16 DIAGNOSIS — Z79899 Other long term (current) drug therapy: Secondary | ICD-10-CM | POA: Insufficient documentation

## 2022-11-16 DIAGNOSIS — F17200 Nicotine dependence, unspecified, uncomplicated: Secondary | ICD-10-CM | POA: Insufficient documentation

## 2022-11-16 DIAGNOSIS — F5222 Female sexual arousal disorder: Secondary | ICD-10-CM | POA: Insufficient documentation

## 2022-11-21 ENCOUNTER — Other Ambulatory Visit: Payer: Self-pay | Admitting: Physician Assistant

## 2022-11-21 DIAGNOSIS — E119 Type 2 diabetes mellitus without complications: Secondary | ICD-10-CM

## 2022-11-21 DIAGNOSIS — E7849 Other hyperlipidemia: Secondary | ICD-10-CM

## 2022-11-21 NOTE — Progress Notes (Signed)
Please, check with [facility] if hemoglobin electrophoresis / fractionation cascade and iron panel could be performed with previously given sample

## 2022-11-23 LAB — HEMOGLOBPATHY+FER W/A THAL RFX

## 2022-11-30 LAB — HEMOGLOBPATHY+FER W/A THAL RFX
Hgb A2: 2.3 % (ref 1.8–3.2)
Hgb A: 97.7 % (ref 96.4–98.8)
Hgb F: 0 % (ref 0.0–2.0)
Hgb S: 0 %

## 2022-11-30 LAB — IRON AND TIBC

## 2022-11-30 LAB — SPECIMEN STATUS REPORT

## 2022-11-30 NOTE — Progress Notes (Signed)
Some of the additional studies showed no abnormalities at this point. The rest are pending or there were not enough blood specimen left to perform them. We will discuss the rest during your next follow-up

## 2022-12-14 NOTE — Progress Notes (Unsigned)
  Patient was not seen for appt d/t no call, no show, or late arrival >10 mins past appt time.    Debera Lat PA [facility] [address] [place], [place] [address] [phone] (phone) [phone] (fax) [facility]

## 2022-12-15 ENCOUNTER — Ambulatory Visit (INDEPENDENT_AMBULATORY_CARE_PROVIDER_SITE_OTHER): Payer: MEDICAID | Admitting: Physician Assistant

## 2022-12-15 DIAGNOSIS — Z91199 Patient's noncompliance with other medical treatment and regimen due to unspecified reason: Secondary | ICD-10-CM

## 2023-03-29 ENCOUNTER — Ambulatory Visit
Admission: RE | Admit: 2023-03-29 | Discharge: 2023-03-29 | Disposition: A | Discharge status: Home / Self Care | Payer: MEDICAID | Source: Ambulatory Visit | Attending: Emergency Medicine | Admitting: Emergency Medicine

## 2023-03-29 VITALS — BP 125/80 | HR 82 | Temp 98.3°F | Resp 18

## 2023-03-29 DIAGNOSIS — R051 Acute cough: Secondary | ICD-10-CM

## 2023-03-29 DIAGNOSIS — J069 Acute upper respiratory infection, unspecified: Secondary | ICD-10-CM

## 2023-03-29 MED ORDER — BENZONATATE 100 MG PO CAPS: 100.0000 mg | ORAL_CAPSULE | Freq: Three times a day (TID) | ORAL | 0 refills | Status: AC | PRN

## 2023-03-29 NOTE — Discharge Instructions (Addendum)
Take the [facility] as directed.  Follow up with your primary care provider if your symptoms are not improving.

## 2023-03-29 NOTE — ED Provider Notes (Signed)
Nathan Barnett    CSN: [id number] Arrival date & time: 03/29/23  1157      History   Chief Complaint Chief Complaint  Patient presents with   Cough    chest congestion and cough, have been taking over the counter medication for over a week and still have cough. - Entered by patient    HPI Nathan Barnett. is a 33 y.o. male.  Patient presents with 1 week history of congestion and cough.  Treatment attempted with Claritin.  No fever or shortness of breath.  The history is provided by the patient and medical records.    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 No pertinent surgical history.     Home Medications    Prior to Admission medications   Medication Sig Start Date End Date Taking? Authorizing Provider  benzonatate (TESSALON) 100 MG capsule Take 1 capsule (100 mg total) by mouth 3 (three) times daily as needed for cough. 03/29/23  Yes Mickie Bail, NP  benztropine (COGENTIN) 0.5 MG tablet Take 2 tablets (1 mg) by mouth 2 (two) times daily. 04/01/21   Clapacs, Jackquline Denmark, MD  busPIRone (BUSPAR) 15 MG tablet Take 1 tablet (15 mg total) by mouth 2 (two) times daily. 04/01/21   Clapacs, Jackquline Denmark, MD  lithium carbonate (ESKALITH) 450 MG CR tablet Take 2 tablets (900 mg total) by mouth at bedtime. 04/01/21   Clapacs, Jackquline Denmark, MD  memantine (NAMENDA) 5 MG tablet Take 5 mg by mouth 2 (two) times daily. 06/28/22   [provider]  mupirocin ointment (BACTROBAN) 2 % Apply 1 Application topically 2 (two) times daily. 10/17/22   Mickie Bail, NP  OLANZapine (ZYPREXA) 5 MG tablet Take 5 mg by mouth 2 (two) times daily. 08/01/22    [provider]  paliperidone (INVEGA) 6 MG 24 hr tablet Take 6 mg by mouth daily. 03/11/22   [provider]  QUEtiapine (SEROQUEL) 400 MG tablet Take 1 tablet (400 mg total) by mouth at bedtime. 04/01/21   Clapacs, Jackquline Denmark, MD  thiamine 100 MG tablet Take 1 tablet (100 mg total) by mouth daily. 04/01/21   Clapacs, Jackquline Denmark, MD  traZODone (DESYREL) 50 MG tablet Take 50 mg by mouth at bedtime as needed. 03/11/22   [provider]    Family History History reviewed. No pertinent family history.  Social History Social History   Tobacco Use   Smoking status: Every Day    Current packs/day: 0.50    Average packs/day: 0.5 packs/day for 2.0 years (1.0 ttl pk-yrs)    Types: Cigarettes   Smokeless tobacco: Never  Vaping Use   Vaping status: Never Used  Substance Use Topics   Alcohol use: Yes   Drug use: Not Currently     Allergies   Patient has no known allergies.   Review of Systems Review of Systems  Constitutional:  Negative for chills and fever.  HENT:  Positive for congestion. Negative for ear pain and sore throat.   Respiratory:  Positive for cough. Negative for shortness of breath.      Physical Exam Triage Vital Signs  ED Triage Vitals [03/29/23 1210]  Encounter Vitals Group     BP 125/80     Systolic BP Percentile      Diastolic BP Percentile      Pulse Rate 82     Resp 18     Temp 98.3 F (36.8 C)     Temp src      SpO2 98 %     Weight      Height      Head Circumference      Peak Flow      Pain Score      Pain Loc      Pain Education      Exclude from Growth Chart    No data found.  Updated Vital Signs BP 125/80   Pulse 82   Temp 98.3 F (36.8 C)   Resp 18   SpO2 98%   Visual Acuity Right Eye Distance:   Left Eye Distance:   Bilateral Distance:    Right Eye Near:   Left Eye Near:    Bilateral Near:     Physical Exam Constitutional:      General: He is not in acute distress. HENT:     Right Ear: Tympanic membrane  normal.     Left Ear: Tympanic membrane normal.     Nose: Nose normal.     Mouth/Throat:     Mouth: Mucous membranes are moist.     Pharynx: Oropharynx is clear.  Cardiovascular:     Rate and Rhythm: Normal rate and regular rhythm.     Heart sounds: Normal heart sounds.  Pulmonary:     Effort: Pulmonary effort is normal. No respiratory distress.     Breath sounds: Normal breath sounds.  Neurological:     Mental Status: He is alert.      UC Treatments / Results  Labs (all labs ordered are listed, but only abnormal results are displayed) Labs Reviewed - No data to display  EKG   Radiology No results found.  Procedures Procedures (including critical care time)  Medications Ordered in UC Medications - No data to display  Initial Impression / Assessment and Plan / UC Course  I have reviewed the triage vital signs and the nursing notes.  Pertinent labs & imaging results that were available during my care of the patient were reviewed by me and considered in my medical decision making (see chart for details).    Cough, viral URI.  Afebrile and vital signs are stable.  Lungs are clear and O2 sat is 98% on room air.  Treating cough with Jerilynn Som as patient reports this is worked well for him in the past.  Instructed him to follow-up with his PCP if he is not improving.  He agrees to plan of care.  Final Clinical Impressions(s) / UC Diagnoses   Final diagnoses:  Acute cough  Viral URI     Discharge Instructions      Take the Tessalon Perles as directed.  Follow-up with your primary care provider if your symptoms are not improving.      ED Prescriptions     Medication Sig Dispense Auth. Provider   benzonatate (TESSALON) 100 MG capsule Take 1 capsule (100 mg total) by mouth 3 (three) times daily as needed for cough. 21 capsule Mickie Bail, NP      PDMP not reviewed this encounter.   Mickie Bail, NP 03/29/23 1259

## 2023-03-29 NOTE — ED Triage Notes (Signed)
Patient to Urgent Care with complaints of cough and chest congestion.  Symptoms x1 week. Taking OTC meds w/ little relief.
